# Patient Record
Sex: Male | Born: 1945
Health system: Southern US, Community
[De-identification: ages and names within clinical notes are randomized; demographics above are authoritative.]

## PROBLEM LIST (undated history)

## (undated) DIAGNOSIS — E8809 Other disorders of plasma-protein metabolism, not elsewhere classified: Secondary | ICD-10-CM

## (undated) DIAGNOSIS — R0602 Shortness of breath: Secondary | ICD-10-CM

## (undated) DIAGNOSIS — E119 Type 2 diabetes mellitus without complications: Secondary | ICD-10-CM

## (undated) DIAGNOSIS — I6529 Occlusion and stenosis of unspecified carotid artery: Secondary | ICD-10-CM

## (undated) DIAGNOSIS — Z8669 Personal history of other diseases of the nervous system and sense organs: Secondary | ICD-10-CM

## (undated) DIAGNOSIS — K759 Inflammatory liver disease, unspecified: Secondary | ICD-10-CM

## (undated) DIAGNOSIS — I2510 Atherosclerotic heart disease of native coronary artery without angina pectoris: Secondary | ICD-10-CM

## (undated) DIAGNOSIS — I209 Angina pectoris, unspecified: Secondary | ICD-10-CM

## (undated) DIAGNOSIS — M1990 Unspecified osteoarthritis, unspecified site: Secondary | ICD-10-CM

## (undated) DIAGNOSIS — R0600 Dyspnea, unspecified: Secondary | ICD-10-CM

## (undated) DIAGNOSIS — R079 Chest pain, unspecified: Secondary | ICD-10-CM

## (undated) DIAGNOSIS — K219 Gastro-esophageal reflux disease without esophagitis: Secondary | ICD-10-CM

## (undated) DIAGNOSIS — F329 Major depressive disorder, single episode, unspecified: Secondary | ICD-10-CM

## (undated) DIAGNOSIS — M7062 Trochanteric bursitis, left hip: Secondary | ICD-10-CM

## (undated) DIAGNOSIS — J189 Pneumonia, unspecified organism: Secondary | ICD-10-CM

## (undated) DIAGNOSIS — I10 Essential (primary) hypertension: Secondary | ICD-10-CM

## (undated) DIAGNOSIS — J449 Chronic obstructive pulmonary disease, unspecified: Secondary | ICD-10-CM

## (undated) DIAGNOSIS — Z8719 Personal history of other diseases of the digestive system: Secondary | ICD-10-CM

## (undated) DIAGNOSIS — F4310 Post-traumatic stress disorder, unspecified: Secondary | ICD-10-CM

## (undated) DIAGNOSIS — M7072 Other bursitis of hip, left hip: Secondary | ICD-10-CM

## (undated) DIAGNOSIS — E756 Lipid storage disorder, unspecified: Secondary | ICD-10-CM

## (undated) HISTORY — PX: ANTERIOR FUSION LUMBAR SPINE: SUR629

## (undated) HISTORY — PX: OTHER SURGICAL HISTORY: SHX169

## (undated) HISTORY — PX: SHOULDER ARTHROSCOPY: SHX128

## (undated) HISTORY — DX: Trochanteric bursitis, left hip: M70.62

## (undated) HISTORY — DX: Chronic obstructive pulmonary disease, unspecified: J44.9

## (undated) HISTORY — PX: APPENDECTOMY: SHX54

## (undated) HISTORY — DX: Unspecified osteoarthritis, unspecified site: M19.90

## (undated) HISTORY — DX: Chest pain, unspecified: R07.9

## (undated) HISTORY — DX: Shortness of breath: R06.02

## (undated) HISTORY — DX: Essential (primary) hypertension: I10

---
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
## 1989-12-30 HISTORY — PX: ELBOW ARTHROSCOPY: SUR87

## 2011-04-30 ENCOUNTER — Emergency Department (HOSPITAL_COMMUNITY)
Admission: EM | Admit: 2011-04-30 | Discharge: 2011-04-30 | Disposition: A | Discharge status: Home / Self Care | Payer: Medicare Other | Attending: Emergency Medicine | Admitting: Emergency Medicine

## 2011-04-30 DIAGNOSIS — I10 Essential (primary) hypertension: Secondary | ICD-10-CM | POA: Insufficient documentation

## 2011-04-30 DIAGNOSIS — X58XXXA Exposure to other specified factors, initial encounter: Secondary | ICD-10-CM | POA: Insufficient documentation

## 2011-04-30 DIAGNOSIS — E119 Type 2 diabetes mellitus without complications: Secondary | ICD-10-CM | POA: Insufficient documentation

## 2011-04-30 DIAGNOSIS — T783XXA Angioneurotic edema, initial encounter: Secondary | ICD-10-CM | POA: Insufficient documentation

## 2011-05-21 ENCOUNTER — Ambulatory Visit (HOSPITAL_COMMUNITY)
Admission: RE | Admit: 2011-05-21 | Discharge: 2011-05-21 | Disposition: A | Discharge status: Home / Self Care | Payer: Medicare Other | Source: Ambulatory Visit | Attending: Cardiology | Admitting: Cardiology

## 2011-05-21 DIAGNOSIS — E785 Hyperlipidemia, unspecified: Secondary | ICD-10-CM | POA: Insufficient documentation

## 2011-05-21 DIAGNOSIS — E119 Type 2 diabetes mellitus without complications: Secondary | ICD-10-CM | POA: Insufficient documentation

## 2011-05-21 DIAGNOSIS — I209 Angina pectoris, unspecified: Secondary | ICD-10-CM | POA: Insufficient documentation

## 2011-05-21 DIAGNOSIS — I2510 Atherosclerotic heart disease of native coronary artery without angina pectoris: Secondary | ICD-10-CM | POA: Insufficient documentation

## 2011-05-21 DIAGNOSIS — I10 Essential (primary) hypertension: Secondary | ICD-10-CM | POA: Insufficient documentation

## 2011-05-21 LAB — GLUCOSE, CAPILLARY: Glucose-Capillary: 145 mg/dL — ABNORMAL HIGH (ref 70–99)

## 2011-05-28 ENCOUNTER — Ambulatory Visit (HOSPITAL_COMMUNITY)
Admission: RE | Admit: 2011-05-28 | Discharge: 2011-05-29 | Disposition: A | Discharge status: Home / Self Care | Payer: Medicare Other | Source: Ambulatory Visit | Attending: Cardiology | Admitting: Cardiology

## 2011-05-28 DIAGNOSIS — I2510 Atherosclerotic heart disease of native coronary artery without angina pectoris: Principal | ICD-10-CM | POA: Insufficient documentation

## 2011-05-28 DIAGNOSIS — R079 Chest pain, unspecified: Secondary | ICD-10-CM | POA: Insufficient documentation

## 2011-05-28 DIAGNOSIS — E785 Hyperlipidemia, unspecified: Secondary | ICD-10-CM | POA: Insufficient documentation

## 2011-05-28 DIAGNOSIS — E119 Type 2 diabetes mellitus without complications: Secondary | ICD-10-CM | POA: Insufficient documentation

## 2011-05-28 DIAGNOSIS — I10 Essential (primary) hypertension: Secondary | ICD-10-CM | POA: Insufficient documentation

## 2011-05-28 LAB — GLUCOSE, CAPILLARY
Glucose-Capillary: 126 mg/dL — ABNORMAL HIGH (ref 70–99)
Glucose-Capillary: 145 mg/dL — ABNORMAL HIGH (ref 70–99)

## 2011-05-29 LAB — CBC
HCT: 41.3 % (ref 39.0–52.0)
MCV: 94.7 fL (ref 78.0–100.0)
RBC: 4.36 MIL/uL (ref 4.22–5.81)
RDW: 12.3 % (ref 11.5–15.5)
WBC: 9.1 10*3/uL (ref 4.0–10.5)

## 2011-05-29 LAB — GLUCOSE, CAPILLARY: Glucose-Capillary: 135 mg/dL — ABNORMAL HIGH (ref 70–99)

## 2011-05-29 LAB — BASIC METABOLIC PANEL
BUN: 18 mg/dL (ref 6–23)
Chloride: 107 mEq/L (ref 96–112)
GFR calc non Af Amer: >60 mL/min (ref >60)
Glucose, Bld: 128 mg/dL — ABNORMAL HIGH (ref 70–99)
Potassium: 4 mEq/L (ref 3.5–5.1)
Sodium: 140 mEq/L (ref 135–145)

## 2011-06-10 ENCOUNTER — Ambulatory Visit (HOSPITAL_COMMUNITY)
Admission: RE | Admit: 2011-06-10 | Discharge: 2011-06-11 | Disposition: A | Discharge status: Home / Self Care | Payer: Medicare Other | Source: Ambulatory Visit | Attending: Cardiology | Admitting: Cardiology

## 2011-06-10 DIAGNOSIS — E119 Type 2 diabetes mellitus without complications: Secondary | ICD-10-CM | POA: Insufficient documentation

## 2011-06-10 DIAGNOSIS — Z8249 Family history of ischemic heart disease and other diseases of the circulatory system: Secondary | ICD-10-CM | POA: Insufficient documentation

## 2011-06-10 DIAGNOSIS — Z9861 Coronary angioplasty status: Secondary | ICD-10-CM | POA: Insufficient documentation

## 2011-06-10 DIAGNOSIS — E785 Hyperlipidemia, unspecified: Secondary | ICD-10-CM | POA: Insufficient documentation

## 2011-06-10 DIAGNOSIS — I2510 Atherosclerotic heart disease of native coronary artery without angina pectoris: Secondary | ICD-10-CM | POA: Insufficient documentation

## 2011-06-10 DIAGNOSIS — I10 Essential (primary) hypertension: Secondary | ICD-10-CM | POA: Insufficient documentation

## 2011-06-10 LAB — GLUCOSE, CAPILLARY
Glucose-Capillary: 112 mg/dL — ABNORMAL HIGH (ref 70–99)
Glucose-Capillary: 131 mg/dL — ABNORMAL HIGH (ref 70–99)
Glucose-Capillary: 89 mg/dL (ref 70–99)

## 2011-06-11 LAB — BASIC METABOLIC PANEL
CO2: 25 mEq/L (ref 19–32)
Calcium: 8.8 mg/dL (ref 8.4–10.5)
Creatinine, Ser: 1.01 mg/dL (ref 0.4–1.5)
GFR calc non Af Amer: >60 mL/min (ref >60)
Glucose, Bld: 112 mg/dL — ABNORMAL HIGH (ref 70–99)

## 2011-06-11 LAB — CBC
Hemoglobin: 13.6 g/dL (ref 13.0–17.0)
MCH: 33.4 pg (ref 26.0–34.0)
MCHC: 35.3 g/dL (ref 30.0–36.0)
MCV: 94.6 fL (ref 78.0–100.0)
RBC: 4.07 MIL/uL — ABNORMAL LOW (ref 4.22–5.81)

## 2011-06-25 NOTE — Discharge Summary (Signed)
[name], [name] NO.:  [id number]  MEDICAL RECORD NO.:  [id number]           PATIENT TYPE:  O  LOCATION:  6531                         FACILITY:  [facility]  PHYSICIAN:  Pamella Pert, MD DATE OF BIRTH:  1946/11/12  DATE OF ADMISSION:  05/28/2011 DATE OF DISCHARGE:  05/29/2011                              DISCHARGE SUMMARY   DISCHARGE DIAGNOSES: 1. Chest pain suggestive of angina pectoris. 2. Hyperlipidemia. 3. Atherosclerotic coronary artery disease status post successful PTCA     and complex intervention including stenting of mid and proximal     left anterior descending with overlapping 3.0 x 38 mm drug-eluting     Xience prime stent in the mid segment and a 3.0 x 22 mm Resolute     stent in the proximal left anterior descending.  PTCA and stenting of the ostial circumflex coronary artery with a 3.0 x 22 mm Resolute drug-eluting stent implantation.  The distal LAD stenosis which is diffusely diseased is left alone for medical therapy and potential for possible future angioplasty if needed.  RECOMMENDATIONS:  The patient will need staged PTCA to the right coronary artery which has high-grade lesion in the proximal to mid segment and also distal segment.  Because of her contrast preservation, diabetic state, and prolonged procedure, it will be done in a staged fashion.  His discharge medications will include aspirin 81 mg p.o. daily, Crestor 20 mg p.o. daily, metoprolol 50 mg p.o. b.i.d., and he also will be started on an ACE inhibitor and lisinopril 10 mg once a day.  I will see him back in the office in a week or 10 days and make further recommendations.  BRIEF HISTORY:  [name] is a 65 year old gentleman with history of diabetes, hypertension, hyperlipidemia who has been having exertional chest pain which is suggestive of angina pectoris.  He underwent diagnostic cardiac catheterization on May 21, 2011 and this had revealed severe  triple vessel coronary artery disease, but, however, because of diffuse nature in his LAD stenoses, it was felt that good option would be a percutaneous coronary intervention for coronary bypass grafting.  His ejection fraction was normal.  He was electively brought to the cardiac catheterization lab yesterday and underwent complex coronary intervention to his mid and proximal LAD followed by stent implantation to his ostial circumflex coronary artery.  He did well and had no complications during the procedure and periprocedurally.  On the day of discharge, his vital signs included temperature of 98.2, pulse of 56 beats per minute, respiration 14, blood pressure 120/64 mmHg.  His CBC revealed a hemoglobin of 14.6, hematocrit of 41.3 with stable platelet and white count.  His BUN was 18, creatinine of 1.2, and with a blood sugar of 128.  He was felt stable for discharge.  His cardiac exam, S1, S2 was normal without any gallop or murmur.  Chest was clear.  Abdomen was soft.  Right groin site had healed well without any hematoma.    Pamella Pert, MD     JRG/MEDQ  D:  05/29/2011  T:  05/29/2011  Job:  [id number]  cc:   Aida Puffer, MD  Electronically Signed by Yates Decamp MD on 06/25/2011 09:27:11 AM

## 2011-06-25 NOTE — Cardiovascular Report (Signed)
[name], [name]:  [id number]  MEDICAL RECORD NO.:  [id number]  LOCATION:  6524                         FACILITY:  [facility]  PHYSICIAN:  Pamella Pert, MD DATE OF BIRTH:  [birth year]  DATE OF PROCEDURE:  06/10/2011 DATE OF DISCHARGE:                           CARDIAC CATHETERIZATION   PROCEDURES PERFORMED: 1. Percutaneous transluminal coronary angioplasty and stenting of the     mid right coronary artery. 2. Percutaneous transluminal coronary angioplasty and scoring balloon     angioplasty of the posterior descending branch of the right     coronary dominant, right coronary artery.  INDICATION:  Mr. Oblinger is a pleasant 65 year old gentleman with history of hypertension, hyperlipidemia, diabetes who has had angioplasty to his LAD and circumflex coronary artery that was done on May 28, 2011.  He has residual high-grade stenosis in the mid RCA and also 2 posterior descending branches had a bifurcating 90% stenosis.  He is now brought for elective stage intervention to the right coronary artery.  ANGIOGRAPHIC DATA:  Right coronary artery is an anterior origin.  The mid segment of the right coronary artery has a focal 80% stenosis.  The PD branch and PL branch are very large.  The PD branch bifurcates. There is a bifurcation stenosis involving the secondary PD branch which wants to about 90% stenosis.  INTERVENTION DATA: 1. Successful PTCA and scoring balloon angioplasty of the primary PD     branch of the distal right coronary artery, dominant distal right     coronary artery with reduction of stenosis from 90% to less than     20% with brisk TIMI 3 to TIMI 3 flow maintained at the end of the     procedure.  The secondary PD branch which has a high-grade 80-90%     stenosis is left alone because of acute angle tortuosity and hiatus     lesion. 2. Successful PTCA and stenting of the mid RCA with implantation of a     4.0 x 18 mm Integrity stent.   The stent was deployed at 10     atmospheric pressure for 50 seconds with reduction of stenosis from     80% to 0% with brisk TIMI 3 to TIMI 3 flow maintained at the end of     the procedure.  A total of 150 mL of contrast was utilized for interventional procedure.  TECHNICAL PROCEDURE:  Under sterile precautions using a 6-French right femoral arterial access, a 6-French JR-4 with sidehole guide catheter was advanced into the ascending aorta and then right coronary artery was selectively engaged.  Using Cougar wire and Angiomax for anticoagulation, I was able to cross the proximal and distal lesion with moderate amount of difficulty with the help of a backup AngioSculpt balloon.  Then I was able to advance the AngioSculpt balloon to the PDA of the right coronary artery and multiple balloon inflations were performed at 8 atmospheric pressure for about a minute each.  Having performed this, I also utilized the same balloon to perform angioplasty of the mid RCA at 10 atmospheric pressure for 30 seconds followed by angiography.  Multiple intracoronary nitroglycerin administration was performed.  Following  this, I stented the mid RCA with a 4.0 x 18 mm Integrity stent at 10 atmospheric pressure for 50 seconds, followed by angiography.  Excellent results were noted.  The guidewire was withdrawn and angiography repeated.  Guide catheter disengaged and pulled out of the body.  Right femoral arteriography was performed through arterial access sheath and the access was closed with Perclose.  Because of persistent ooze, a FemoStop was applied.  No other immediate complications were evident.  The patient tolerated the procedure.     Pamella Pert, MD     JRG/MEDQ  D:  06/10/2011  T:  06/11/2011  Job:  [id number]  cc:   Aida Puffer, MD  Electronically Signed by Yates Decamp MD on 06/25/2011 09:27:20 AM

## 2011-06-25 NOTE — Cardiovascular Report (Signed)
NAME:  Miguel Brock, BANIK NO.:  [id number]  MEDICAL RECORD NO.:  [id number]           PATIENT TYPE:  O  LOCATION:  6531                         FACILITY:  [facility]  PHYSICIAN:  Pamella Pert, MD DATE OF BIRTH:  [birth year]  DATE OF PROCEDURE:  05/28/2011 DATE OF DISCHARGE:                           CARDIAC CATHETERIZATION   PROCEDURES PERFORMED: 1. PTCA and stenting of the mid and proximal LAD with implantation of     overlapping drug-eluting 3.0 x 38-mm and a 3.0 x 22-mm XIENCE PRIME     and Resolute stents. 2. PTCA and stenting of the proximal ostial circumflex coronary artery     with 3.0 x 22-mm Resolute drug-eluting stent.  INDICATIONS:  [name] is a 65 year old gentleman with known coronary disease.  He had undergone cardiac catheterization on May 21, 2011, and this had revealed a ostial 80% circumflex stenosis, mid LAD diffuse disease about 21, initially felt to be 60-70%, and a mid to distal LAD of greater than 90% stenosis in tandem fashion.  He also has a mid RCA 80% and a distal RCA 80-90% stenosis in the PL branch. Because of diffuse disease in the LAD, it was felt that the lesion in the circumflex and RCA were amenable for angioplasty percutaneously and LAD focal cutting balloon or scoring balloon angioplasty was contemplated initially, and he was brought for elective angioplasty of the same.  INTERVENTIONAL DATA: 1. Successful PTCA and stenting will be made and proximal LAD with     implantation of a 3.0 x 38-mm overlapped proximally with a 3.0 x 22-     mm drug-eluting stents.  XIENCE PRIME distally and a Resolute DES     proximally necrotized.  The stents were deployed at 8 atmospheric     pressure; however, they were postdilated at 6 atmospheric pressure     with noncompliant 3.0 x 23-mm Resolute stent balloon. 2. Successful PTCA and stenting with the proximal ostial circumflex     with implantation of a 3.0 x 22-mm Resolute  drug-eluting stent.     The LAD stenosis was reduced from 80% to 0%.   Overall results.  Excellent angiographic results with excellent stent deployed into the ostial circumflex and also ostial LAD and the LAD stent extended all the way from the proximal ostial region to the mid segment of the LAD.  The distal LAD is left alone. Circumflex coronary artery stenosis was reduced from 80% to 0% with brisk TIMI 3 to TIMI 3 flow maintained.  Excellent results were evident.  RECOMMENDATIONS:  A total of 210 mL of contrast was utilized for intervention procedure.  I will plan to do a staged PCI for the right coronary artery.  He uses scoring balloon angioplasty of PL branch, which was bifurcating stenosis, and also including mid RCA and we stented directly.  At that time, I may consider relooking of LAD for proximal scoring balloon angioplasty of the distal LAD, but potentially I may treat this with medications only.  TECHNIQUE OF THE PROCEDURE:  Under sterile precautions, using a 6-French right femoral artery access, a 6-French XT 3.5 guide  catheter was utilized engaged left main coronary artery.  Using a Cougar guidewire of the LAD was selectively wired.  Then a 2.0 x 10-mm AngioScore balloon was advanced in to the LAD.  I was unable to pass through the mid segment of the LAD, which appeared to be hazy and then we realized  that the stenosis was much tighter and felt that it was at least about 80%. Hence we scored this with a scoring balloon followed by withdrawal of his balloon because of inability to advance it into the distal segment of the stenosis in the mid LAD.  At this point, we decided to take a 3.0 x 30-mm apex balloon and balloon angioplasty as well as decompression into the mid LAD was performed.  This was followed by implantation of Xience Prime 3.0 x 38-mm stent at 8 atmospheric pressure and postdilatation with [place] Trek 3.0 x 20-mm noncompliant balloon at 12 atmospheric  pressure x2 for 30 seconds each.  Having performed this, we felt we should probably leave the distal LAD stenosis alone and we decided to go with the circumflex stenting.  Initially, I dilated this with a 3.0 x 20-mm [place] Trek, and I was able to place the balloon right at the ostium of the circumflex.  I felt confident that I can easily cross ostium of the circumflex coronary artery.  This was followed by taking a 3.0 x 22-mm Resolute drug-eluting stent into the circumflex coronary artery and placing it at the ostium of the circumflex.  Then we inflated the balloon, the stent did slip back into the left main LAD region.  At this point, we decided we should go after the LAD and circumflex with kissing balloon angioplasty.  Hence, another Cougar wire advanced back into the LAD.  A new [place] Trek 3.0 x 20 mm balloon was advanced into the circumflex coronary artery and 3.0 x 20-mm apex in to the LAD.  Multiple inflations from 8-12 atmospheric pressure was performed throughout the LAD and circumflex coronary artery stent. Having performed this, angiography with edge dissection at the distal end of the stent. We decided to stent the whole entire proximal LAD with a 3.0 x 22-mm Resolute stent, which was deployed at 12 atmospheric pressure for 30 seconds followed by postdilatation to same stent balloon keeping it between the old and the new stent at 60 atmospheric pressure for 20 seconds.  Having performed this, angiography revealed excellent results. The circumflex stenosis was also well covered.  At this point, all the wires were withdrawn, angiography was repeated, guide catheter disengaged and pulled out of the body.  Initially, I attempted to proceed with the scoring balloon angioplasty of the distal LAD only.  However, the mid segment of his LAD appeared to be more significant than was evident angiographically  and I was unable to cross  stenosis with the balloon.  Hence, we decided that we  should proceed with angioplasty and stenting of the mid LAD followed by reconstitution of the distal LAD angioplasty.  Stenting of the mid LAD, which we had excellent results and we decided to move the distal LAD stenosis alone at it was severely diffusely diseased, also small vessel.  Hence we decided to proceed with circumflex coronary artery stenosis stenting.  However, when the stent was placed into the ostial circumflex.  This stent did slip back into the left main LAD.  Hence kissing balloon angioplasty followed by dissection in the proximal LAD leading to stenting was performed. Right femoral arteriography  was performed through the arterial access sheath; however, I was unable to obtain adequate hemostasis.  Hence FemoStop was applied, and the patient was transferred to the recovery area in stable condition.  Overall, the patient tolerated the procedure well.  No immediate complications were noted.     Pamella Pert, MD     JRG/MEDQ  D:  05/28/2011  T:  05/29/2011  Job:  [id number]  cc:   Aida Puffer, MD  Electronically Signed by Yates Decamp MD on 06/25/2011 09:26:52 AM

## 2011-06-25 NOTE — Discharge Summary (Signed)
  [name], [name].:  [id number]  MEDICAL RECORD NO.:  [id number]  LOCATION:  6524                         FACILITY:  [facility]  PHYSICIAN:  Pamella Pert, MD DATE OF BIRTH:  January 05, 1946  DATE OF ADMISSION:  06/10/2011 DATE OF DISCHARGE:  06/11/2011                              DISCHARGE SUMMARY   DISCHARGE DIAGNOSES: 1. Atherosclerotic coronary artery disease status post PTCA and     stenting of the mid right carotid artery with implantation of a 4.0     x 18 mm nondrug-eluting Integrity stent and scoring balloon     angioplasty of the posterior descending artery branch of the right     coronary artery. 2. History of PTCA and stenting to proximal and mid left anterior     descending with a 3.0 x 22 mm overlapped with a 3.0 x 38 mm     Resolute stent, and a proximal circumflex coronary artery 3.0 x 22     mm Resolute stent performed on May 28, 2011. 3. Hypertension. 4. Diabetes mellitus. 5. Family history of premature coronary artery disease.  RECOMMENDATIONS:  The patient was discharged home today.  His discharge medications will be the same home medications as before, that includes; 1. Aspirin 81 mg p.o. daily. 2. Lipitor 20 mg p.o. daily. 3. Lisinopril 5 mg p.o. daily. 4. Metformin 5 mg p.o. b.i.d. 5. Metoprolol tartrate 50 mg half tablet p.o. b.i.d. 6. Nitroglycerin 0.4 mg sublingual p.r.n. 7. Prasugrel 10 mg p.o. daily. 8. Vitamin D2 50,000 international units q.1 week.  The patient will follow up with me in about 2 weeks and along with Dr. Aida Puffer in 2 weeks.  BRIEF HISTORY AND HOSPITAL COURSE:  Miguel Brock is a pleasant 65-year gentleman who had been having exertional chest pain, which was suggestive of angina pectoris.  He had undergone cardiac catheterization and angioplasty to his proximal and mid LAD and circumflex coronary artery on May 28, 2011, and he was now brought back for elective staged intervention to the right  coronary artery.  He underwent successful angioplasty of the same.  He was felt stable for discharge the following day.  He is chest pain-free, and his EKG demonstrates normal sinus rhythm on the day of discharge.  I will follow him up on the outpatient basis.     Pamella Pert, MD     JRG/MEDQ  D:  06/11/2011  T:  06/12/2011  Job:  [id number]  cc:   Aida Puffer, MD  Electronically Signed by Yates Decamp MD on 06/25/2011 78:46:96 AM

## 2011-07-20 NOTE — Cardiovascular Report (Signed)
[name], [name] NO.:  [id number]  MEDICAL RECORD NO.:  [id number]           PATIENT TYPE:  O  LOCATION:  6522                         FACILITY:  [facility]  PHYSICIAN:  Pamella Pert, MD DATE OF BIRTH:  1946/08/16  DATE OF PROCEDURE:  05/21/2011 DATE OF DISCHARGE:  05/21/2011                           CARDIAC CATHETERIZATION   PROCEDURE PERFORMED: 1. Left ventriculography. 2. Selective right and left coronary arteriography.  INDICATIONS:  [name] is a diabetic 65 year old gentleman who has history of hypertension and hyperlipidemia who had been doing well until recently, had chest discomfort, which is exertional.  This started about 2 months ago.  Given his significant chest pain and multiple cardiovascular risk factors and his chest pain very typical for angina pectoris, he was directly brought to the cardiac cath lab to evaluate his coronary anatomy.  HEMODYNAMIC DATA:  The left ventricular pressure was 119/10 with end- diastolic pressure of 15 mmHg.  Aortic pressure was 113/63 with a mean of 86 mmHg.  There was no pressure gradient across the aortic valve.  ANGIOGRAPHIC DATA:  Left ventricle.  Left ventricular systolic function was normal with an ejection fraction of 55% to 60% with low regional wall motion abnormalities.  Right coronary artery.  Right coronary artery is a very large-caliber vessel and a dominant vessel giving origin to large PDA, which bifurcates and a large PL branch.  The RCA in the proximal segment has an 80% focal stenosis followed by trifurcating stenoses in the distal right coronary artery involving the PD branch.  This is 80% to 90% stenosed.  Left main coronary artery.  Left main coronary artery is a large-caliber vessel.  Smooth and normal.  LAD.  LAD is a large-caliber vessel in the proximal segment giving origin to high diagonal one, which is smooth with mild luminal irregularity in the midsegment.  The  LAD itself after the origin of diagonal is severely diffusely diseased.  Mid to distal LAD has a 95% tandem stenoses involving the mid-to-distal and towards the apical LAD.  Circumflex coronary artery.  Circumflex coronary artery has ostial long 70% to 80% stenoses.  This a large-caliber vessel.  IMPRESSION:  Severe triple-vessel coronary artery disease.  LAD is not bypassable with severe diffuse disease.  However, the mid-to-distal LAD stenosis appears to be amenable for scoring balloon angioplasty. Circumflex and right coronary stenosis appear to be amenable for balloon angioplasty and stenting.  RECOMMENDATIONS: 1. She will be scheduled for an elective PCI to the,     a.     RCA proximal to mid, direct stenting followed by scoring      balloon angioplasty of the PD branch followed by stenting.     b.     Scoring balloon angioplasty and PTCA of the distal LAD. 2. The patient will then probably be scheduled for a staged procedure     for the circumflex coronary artery unless the RCA procedure and LAD     procedure goes well.  Then we can consider stenting of the     circumflex at the same time.  The patient does not want  to have     bypass surgery and this was discussed even prior to the procedure.     I will re-discuss these issues with the patient and his wife and     make final recommendations. 3. I will stop his Zocor and switch him over to Crestor 20 mg once a     day and start Effient at 10 mg once a day, Imdur 60 mg p.o. daily     along with giving him a prescription for sublingual nitroglycerin     use. 4. A total of 90 mL of contrast was utilized for diagnostic     angiography.  TECHNIQUE OF THE PROCEDURE:  Under sterile precautions, using a 5-French right radial access, a 5-French Tig #4 catheter was advanced into the ascending aorta.  Selective right and left coronary arteriography was performed.  Catheter was then exchanged with the help of a long exchange length  J-wire for a 5-French pigtail catheter and left ventriculography was performed both in LAO and RAO projection.  Then the catheter was then pulled into the asymmetric aorta then out of the body over a J- wire.  Intracoronary nitroglycerin was administered during angiography. Overall, the patient tolerated the procedure well.  No immediate complications.     Pamella Pert, MD     JRG/MEDQ  D:  05/21/2011  T:  05/22/2011  Job:  [id number]  Electronically Signed by Yates Decamp MD on 07/20/2011 01:40:14 PM

## 2012-02-13 DIAGNOSIS — I209 Angina pectoris, unspecified: Secondary | ICD-10-CM | POA: Diagnosis not present

## 2012-02-13 DIAGNOSIS — I10 Essential (primary) hypertension: Secondary | ICD-10-CM | POA: Diagnosis not present

## 2012-02-13 DIAGNOSIS — G8921 Chronic pain due to trauma: Secondary | ICD-10-CM | POA: Diagnosis not present

## 2012-06-17 DIAGNOSIS — E7800 Pure hypercholesterolemia, unspecified: Secondary | ICD-10-CM | POA: Diagnosis not present

## 2012-06-17 DIAGNOSIS — Z9861 Coronary angioplasty status: Secondary | ICD-10-CM | POA: Diagnosis not present

## 2012-06-17 DIAGNOSIS — I2510 Atherosclerotic heart disease of native coronary artery without angina pectoris: Secondary | ICD-10-CM | POA: Diagnosis not present

## 2012-06-17 DIAGNOSIS — I10 Essential (primary) hypertension: Secondary | ICD-10-CM | POA: Diagnosis not present

## 2012-06-24 DIAGNOSIS — E1169 Type 2 diabetes mellitus with other specified complication: Secondary | ICD-10-CM | POA: Diagnosis not present

## 2012-06-24 DIAGNOSIS — I2510 Atherosclerotic heart disease of native coronary artery without angina pectoris: Secondary | ICD-10-CM | POA: Diagnosis not present

## 2012-06-24 DIAGNOSIS — E7800 Pure hypercholesterolemia, unspecified: Secondary | ICD-10-CM | POA: Diagnosis not present

## 2012-07-24 DIAGNOSIS — I2510 Atherosclerotic heart disease of native coronary artery without angina pectoris: Secondary | ICD-10-CM | POA: Diagnosis not present

## 2012-07-24 DIAGNOSIS — E1169 Type 2 diabetes mellitus with other specified complication: Secondary | ICD-10-CM | POA: Diagnosis not present

## 2012-07-24 DIAGNOSIS — I10 Essential (primary) hypertension: Secondary | ICD-10-CM | POA: Diagnosis not present

## 2012-07-24 DIAGNOSIS — I209 Angina pectoris, unspecified: Secondary | ICD-10-CM | POA: Diagnosis not present

## 2012-07-29 DIAGNOSIS — IMO0002 Reserved for concepts with insufficient information to code with codable children: Secondary | ICD-10-CM | POA: Diagnosis not present

## 2012-07-29 DIAGNOSIS — D2370 Other benign neoplasm of skin of unspecified lower limb, including hip: Secondary | ICD-10-CM | POA: Diagnosis not present

## 2012-07-29 DIAGNOSIS — L089 Local infection of the skin and subcutaneous tissue, unspecified: Secondary | ICD-10-CM | POA: Diagnosis not present

## 2012-07-29 DIAGNOSIS — D485 Neoplasm of uncertain behavior of skin: Secondary | ICD-10-CM | POA: Diagnosis not present

## 2012-07-29 DIAGNOSIS — E785 Hyperlipidemia, unspecified: Secondary | ICD-10-CM | POA: Diagnosis not present

## 2012-08-03 DIAGNOSIS — Z9861 Coronary angioplasty status: Secondary | ICD-10-CM | POA: Diagnosis not present

## 2012-08-03 DIAGNOSIS — I10 Essential (primary) hypertension: Secondary | ICD-10-CM | POA: Diagnosis not present

## 2012-08-03 DIAGNOSIS — I2510 Atherosclerotic heart disease of native coronary artery without angina pectoris: Secondary | ICD-10-CM | POA: Diagnosis not present

## 2012-08-03 DIAGNOSIS — E7800 Pure hypercholesterolemia, unspecified: Secondary | ICD-10-CM | POA: Diagnosis not present

## 2012-09-21 DIAGNOSIS — R5381 Other malaise: Secondary | ICD-10-CM | POA: Diagnosis not present

## 2012-09-21 DIAGNOSIS — R5383 Other fatigue: Secondary | ICD-10-CM | POA: Diagnosis not present

## 2012-09-21 DIAGNOSIS — J019 Acute sinusitis, unspecified: Secondary | ICD-10-CM | POA: Diagnosis not present

## 2012-10-08 DIAGNOSIS — J13 Pneumonia due to Streptococcus pneumoniae: Secondary | ICD-10-CM | POA: Diagnosis not present

## 2012-10-08 DIAGNOSIS — J0100 Acute maxillary sinusitis, unspecified: Secondary | ICD-10-CM | POA: Diagnosis not present

## 2012-10-13 ENCOUNTER — Other Ambulatory Visit: Payer: Self-pay | Admitting: Family Medicine

## 2012-10-13 DIAGNOSIS — J329 Chronic sinusitis, unspecified: Secondary | ICD-10-CM

## 2012-10-13 DIAGNOSIS — R0981 Nasal congestion: Secondary | ICD-10-CM

## 2012-10-15 ENCOUNTER — Ambulatory Visit
Admission: RE | Admit: 2012-10-15 | Discharge: 2012-10-15 | Disposition: A | Discharge status: Home / Self Care | Payer: Medicare Other | Source: Ambulatory Visit | Attending: Family Medicine | Admitting: Family Medicine

## 2012-10-15 DIAGNOSIS — R062 Wheezing: Secondary | ICD-10-CM | POA: Diagnosis not present

## 2012-10-15 DIAGNOSIS — J3489 Other specified disorders of nose and nasal sinuses: Secondary | ICD-10-CM | POA: Diagnosis not present

## 2012-10-15 DIAGNOSIS — R05 Cough: Secondary | ICD-10-CM | POA: Diagnosis not present

## 2012-10-15 DIAGNOSIS — R0981 Nasal congestion: Secondary | ICD-10-CM

## 2012-10-15 DIAGNOSIS — J329 Chronic sinusitis, unspecified: Secondary | ICD-10-CM

## 2012-10-15 DIAGNOSIS — R059 Cough, unspecified: Secondary | ICD-10-CM | POA: Diagnosis not present

## 2012-10-15 DIAGNOSIS — R0602 Shortness of breath: Secondary | ICD-10-CM | POA: Diagnosis not present

## 2012-10-28 DIAGNOSIS — A0472 Enterocolitis due to Clostridium difficile, not specified as recurrent: Secondary | ICD-10-CM | POA: Diagnosis not present

## 2012-10-28 DIAGNOSIS — K5289 Other specified noninfective gastroenteritis and colitis: Secondary | ICD-10-CM | POA: Diagnosis not present

## 2012-10-28 DIAGNOSIS — R1084 Generalized abdominal pain: Secondary | ICD-10-CM | POA: Diagnosis not present

## 2012-11-02 DIAGNOSIS — E119 Type 2 diabetes mellitus without complications: Secondary | ICD-10-CM | POA: Diagnosis not present

## 2012-11-02 DIAGNOSIS — I10 Essential (primary) hypertension: Secondary | ICD-10-CM | POA: Diagnosis not present

## 2012-11-02 DIAGNOSIS — K5732 Diverticulitis of large intestine without perforation or abscess without bleeding: Secondary | ICD-10-CM | POA: Diagnosis not present

## 2012-11-10 DIAGNOSIS — K5732 Diverticulitis of large intestine without perforation or abscess without bleeding: Secondary | ICD-10-CM | POA: Diagnosis not present

## 2012-11-10 DIAGNOSIS — I259 Chronic ischemic heart disease, unspecified: Secondary | ICD-10-CM | POA: Diagnosis not present

## 2012-12-02 DIAGNOSIS — K5289 Other specified noninfective gastroenteritis and colitis: Secondary | ICD-10-CM | POA: Diagnosis not present

## 2012-12-02 DIAGNOSIS — Z86010 Personal history of colonic polyps: Secondary | ICD-10-CM | POA: Diagnosis not present

## 2012-12-28 DIAGNOSIS — M545 Low back pain, unspecified: Secondary | ICD-10-CM | POA: Diagnosis not present

## 2012-12-28 DIAGNOSIS — S339XXA Sprain of unspecified parts of lumbar spine and pelvis, initial encounter: Secondary | ICD-10-CM | POA: Diagnosis not present

## 2012-12-28 DIAGNOSIS — I259 Chronic ischemic heart disease, unspecified: Secondary | ICD-10-CM | POA: Diagnosis not present

## 2012-12-28 DIAGNOSIS — M76899 Other specified enthesopathies of unspecified lower limb, excluding foot: Secondary | ICD-10-CM | POA: Diagnosis not present

## 2012-12-28 DIAGNOSIS — I209 Angina pectoris, unspecified: Secondary | ICD-10-CM | POA: Diagnosis not present

## 2012-12-28 DIAGNOSIS — K5732 Diverticulitis of large intestine without perforation or abscess without bleeding: Secondary | ICD-10-CM | POA: Diagnosis not present

## 2013-02-03 DIAGNOSIS — I10 Essential (primary) hypertension: Secondary | ICD-10-CM | POA: Diagnosis not present

## 2013-02-03 DIAGNOSIS — Z9861 Coronary angioplasty status: Secondary | ICD-10-CM | POA: Diagnosis not present

## 2013-02-03 DIAGNOSIS — E785 Hyperlipidemia, unspecified: Secondary | ICD-10-CM | POA: Diagnosis not present

## 2013-02-03 DIAGNOSIS — I2510 Atherosclerotic heart disease of native coronary artery without angina pectoris: Secondary | ICD-10-CM | POA: Diagnosis not present

## 2013-02-03 DIAGNOSIS — J019 Acute sinusitis, unspecified: Secondary | ICD-10-CM | POA: Diagnosis not present

## 2013-02-03 DIAGNOSIS — E119 Type 2 diabetes mellitus without complications: Secondary | ICD-10-CM | POA: Diagnosis not present

## 2013-02-03 DIAGNOSIS — IMO0002 Reserved for concepts with insufficient information to code with codable children: Secondary | ICD-10-CM | POA: Diagnosis not present

## 2013-03-15 DIAGNOSIS — I2510 Atherosclerotic heart disease of native coronary artery without angina pectoris: Secondary | ICD-10-CM | POA: Diagnosis not present

## 2013-03-15 DIAGNOSIS — E119 Type 2 diabetes mellitus without complications: Secondary | ICD-10-CM | POA: Diagnosis not present

## 2013-03-15 DIAGNOSIS — E7800 Pure hypercholesterolemia, unspecified: Secondary | ICD-10-CM | POA: Diagnosis not present

## 2013-03-15 DIAGNOSIS — I10 Essential (primary) hypertension: Secondary | ICD-10-CM | POA: Diagnosis not present

## 2013-03-18 DIAGNOSIS — E119 Type 2 diabetes mellitus without complications: Secondary | ICD-10-CM | POA: Diagnosis not present

## 2013-04-01 DIAGNOSIS — I2510 Atherosclerotic heart disease of native coronary artery without angina pectoris: Secondary | ICD-10-CM | POA: Diagnosis not present

## 2013-04-01 DIAGNOSIS — K219 Gastro-esophageal reflux disease without esophagitis: Secondary | ICD-10-CM | POA: Diagnosis not present

## 2013-04-01 DIAGNOSIS — Z1331 Encounter for screening for depression: Secondary | ICD-10-CM | POA: Diagnosis not present

## 2013-04-01 DIAGNOSIS — I10 Essential (primary) hypertension: Secondary | ICD-10-CM | POA: Diagnosis not present

## 2013-04-01 DIAGNOSIS — B1710 Acute hepatitis C without hepatic coma: Secondary | ICD-10-CM | POA: Diagnosis not present

## 2013-04-01 DIAGNOSIS — E785 Hyperlipidemia, unspecified: Secondary | ICD-10-CM | POA: Diagnosis not present

## 2013-04-01 DIAGNOSIS — E119 Type 2 diabetes mellitus without complications: Secondary | ICD-10-CM | POA: Diagnosis not present

## 2013-04-19 DIAGNOSIS — M47817 Spondylosis without myelopathy or radiculopathy, lumbosacral region: Secondary | ICD-10-CM | POA: Diagnosis not present

## 2013-04-19 DIAGNOSIS — M25559 Pain in unspecified hip: Secondary | ICD-10-CM | POA: Diagnosis not present

## 2013-04-26 DIAGNOSIS — M545 Low back pain, unspecified: Secondary | ICD-10-CM | POA: Diagnosis not present

## 2013-04-30 DIAGNOSIS — M545 Low back pain, unspecified: Secondary | ICD-10-CM | POA: Diagnosis not present

## 2013-05-05 DIAGNOSIS — M545 Low back pain, unspecified: Secondary | ICD-10-CM | POA: Diagnosis not present

## 2013-05-19 DIAGNOSIS — M545 Low back pain, unspecified: Secondary | ICD-10-CM | POA: Diagnosis not present

## 2013-05-25 DIAGNOSIS — J209 Acute bronchitis, unspecified: Secondary | ICD-10-CM | POA: Diagnosis not present

## 2013-05-25 DIAGNOSIS — R062 Wheezing: Secondary | ICD-10-CM | POA: Diagnosis not present

## 2013-05-25 DIAGNOSIS — I10 Essential (primary) hypertension: Secondary | ICD-10-CM | POA: Diagnosis not present

## 2013-05-25 DIAGNOSIS — E119 Type 2 diabetes mellitus without complications: Secondary | ICD-10-CM | POA: Diagnosis not present

## 2013-06-04 DIAGNOSIS — IMO0001 Reserved for inherently not codable concepts without codable children: Secondary | ICD-10-CM | POA: Diagnosis not present

## 2013-06-04 DIAGNOSIS — E119 Type 2 diabetes mellitus without complications: Secondary | ICD-10-CM | POA: Diagnosis not present

## 2013-06-04 DIAGNOSIS — Z79899 Other long term (current) drug therapy: Secondary | ICD-10-CM | POA: Diagnosis not present

## 2013-06-04 DIAGNOSIS — R5383 Other fatigue: Secondary | ICD-10-CM | POA: Diagnosis not present

## 2013-06-04 DIAGNOSIS — I10 Essential (primary) hypertension: Secondary | ICD-10-CM | POA: Diagnosis not present

## 2013-06-04 DIAGNOSIS — E1169 Type 2 diabetes mellitus with other specified complication: Secondary | ICD-10-CM | POA: Diagnosis not present

## 2013-06-04 DIAGNOSIS — E7800 Pure hypercholesterolemia, unspecified: Secondary | ICD-10-CM | POA: Diagnosis not present

## 2013-06-04 DIAGNOSIS — I2510 Atherosclerotic heart disease of native coronary artery without angina pectoris: Secondary | ICD-10-CM | POA: Diagnosis not present

## 2013-06-04 DIAGNOSIS — Z125 Encounter for screening for malignant neoplasm of prostate: Secondary | ICD-10-CM | POA: Diagnosis not present

## 2013-06-04 DIAGNOSIS — E785 Hyperlipidemia, unspecified: Secondary | ICD-10-CM | POA: Diagnosis not present

## 2013-06-04 DIAGNOSIS — R5381 Other malaise: Secondary | ICD-10-CM | POA: Diagnosis not present

## 2013-06-08 DIAGNOSIS — M48061 Spinal stenosis, lumbar region without neurogenic claudication: Secondary | ICD-10-CM | POA: Diagnosis not present

## 2013-06-08 DIAGNOSIS — I2510 Atherosclerotic heart disease of native coronary artery without angina pectoris: Secondary | ICD-10-CM | POA: Diagnosis not present

## 2013-06-08 DIAGNOSIS — Z125 Encounter for screening for malignant neoplasm of prostate: Secondary | ICD-10-CM | POA: Diagnosis not present

## 2013-06-08 DIAGNOSIS — E785 Hyperlipidemia, unspecified: Secondary | ICD-10-CM | POA: Diagnosis not present

## 2013-06-08 DIAGNOSIS — I10 Essential (primary) hypertension: Secondary | ICD-10-CM | POA: Diagnosis not present

## 2013-06-08 DIAGNOSIS — Z0000 Encounter for general adult medical examination without abnormal findings: Secondary | ICD-10-CM | POA: Diagnosis not present

## 2013-06-08 DIAGNOSIS — E1169 Type 2 diabetes mellitus with other specified complication: Secondary | ICD-10-CM | POA: Diagnosis not present

## 2013-06-08 DIAGNOSIS — Z23 Encounter for immunization: Secondary | ICD-10-CM | POA: Diagnosis not present

## 2013-06-18 DIAGNOSIS — I10 Essential (primary) hypertension: Secondary | ICD-10-CM | POA: Diagnosis not present

## 2013-06-18 DIAGNOSIS — E7800 Pure hypercholesterolemia, unspecified: Secondary | ICD-10-CM | POA: Diagnosis not present

## 2013-06-18 DIAGNOSIS — I2510 Atherosclerotic heart disease of native coronary artery without angina pectoris: Secondary | ICD-10-CM | POA: Diagnosis not present

## 2013-06-18 DIAGNOSIS — E119 Type 2 diabetes mellitus without complications: Secondary | ICD-10-CM | POA: Diagnosis not present

## 2013-06-25 DIAGNOSIS — M545 Low back pain, unspecified: Secondary | ICD-10-CM | POA: Diagnosis not present

## 2013-06-30 ENCOUNTER — Encounter (HOSPITAL_COMMUNITY): Payer: Self-pay | Admitting: Pharmacy Technician

## 2013-07-01 ENCOUNTER — Other Ambulatory Visit: Payer: Self-pay

## 2013-07-01 DIAGNOSIS — M48061 Spinal stenosis, lumbar region without neurogenic claudication: Secondary | ICD-10-CM | POA: Diagnosis not present

## 2013-07-05 ENCOUNTER — Encounter (HOSPITAL_COMMUNITY)
Admission: RE | Admit: 2013-07-05 | Discharge: 2013-07-05 | Disposition: A | Discharge status: Home / Self Care | Payer: Medicare Other | Source: Ambulatory Visit | Attending: Orthopedic Surgery | Admitting: Orthopedic Surgery

## 2013-07-05 ENCOUNTER — Encounter (HOSPITAL_COMMUNITY): Payer: Self-pay

## 2013-07-05 ENCOUNTER — Ambulatory Visit (HOSPITAL_COMMUNITY)
Admission: RE | Admit: 2013-07-05 | Discharge: 2013-07-05 | Disposition: A | Discharge status: Home / Self Care | Payer: Medicare Other | Source: Ambulatory Visit | Attending: Orthopedic Surgery | Admitting: Orthopedic Surgery

## 2013-07-05 DIAGNOSIS — E119 Type 2 diabetes mellitus without complications: Secondary | ICD-10-CM | POA: Diagnosis not present

## 2013-07-05 DIAGNOSIS — Z9861 Coronary angioplasty status: Secondary | ICD-10-CM | POA: Diagnosis not present

## 2013-07-05 DIAGNOSIS — Z01810 Encounter for preprocedural cardiovascular examination: Secondary | ICD-10-CM | POA: Insufficient documentation

## 2013-07-05 DIAGNOSIS — Z0183 Encounter for blood typing: Secondary | ICD-10-CM | POA: Insufficient documentation

## 2013-07-05 DIAGNOSIS — Z01812 Encounter for preprocedural laboratory examination: Secondary | ICD-10-CM | POA: Insufficient documentation

## 2013-07-05 DIAGNOSIS — K219 Gastro-esophageal reflux disease without esophagitis: Secondary | ICD-10-CM | POA: Diagnosis not present

## 2013-07-05 DIAGNOSIS — I10 Essential (primary) hypertension: Secondary | ICD-10-CM | POA: Diagnosis not present

## 2013-07-05 DIAGNOSIS — M47817 Spondylosis without myelopathy or radiculopathy, lumbosacral region: Secondary | ICD-10-CM | POA: Diagnosis not present

## 2013-07-05 DIAGNOSIS — M5137 Other intervertebral disc degeneration, lumbosacral region: Secondary | ICD-10-CM | POA: Diagnosis not present

## 2013-07-05 DIAGNOSIS — Z09 Encounter for follow-up examination after completed treatment for conditions other than malignant neoplasm: Secondary | ICD-10-CM | POA: Diagnosis not present

## 2013-07-05 DIAGNOSIS — M51379 Other intervertebral disc degeneration, lumbosacral region without mention of lumbar back pain or lower extremity pain: Secondary | ICD-10-CM | POA: Diagnosis not present

## 2013-07-05 DIAGNOSIS — I2510 Atherosclerotic heart disease of native coronary artery without angina pectoris: Secondary | ICD-10-CM | POA: Diagnosis not present

## 2013-07-05 DIAGNOSIS — M519 Unspecified thoracic, thoracolumbar and lumbosacral intervertebral disc disorder: Secondary | ICD-10-CM | POA: Diagnosis not present

## 2013-07-05 DIAGNOSIS — R935 Abnormal findings on diagnostic imaging of other abdominal regions, including retroperitoneum: Secondary | ICD-10-CM | POA: Diagnosis not present

## 2013-07-05 DIAGNOSIS — M549 Dorsalgia, unspecified: Secondary | ICD-10-CM | POA: Diagnosis not present

## 2013-07-05 DIAGNOSIS — Z01818 Encounter for other preprocedural examination: Secondary | ICD-10-CM | POA: Insufficient documentation

## 2013-07-05 HISTORY — DX: Type 2 diabetes mellitus without complications: E11.9

## 2013-07-05 HISTORY — DX: Gastro-esophageal reflux disease without esophagitis: K21.9

## 2013-07-05 HISTORY — DX: Personal history of other diseases of the digestive system: Z87.19

## 2013-07-05 HISTORY — DX: Essential (primary) hypertension: I10

## 2013-07-05 HISTORY — DX: Inflammatory liver disease, unspecified: K75.9

## 2013-07-05 HISTORY — DX: Atherosclerotic heart disease of native coronary artery without angina pectoris: I25.10

## 2013-07-05 HISTORY — DX: Pneumonia, unspecified organism: J18.9

## 2013-07-05 HISTORY — DX: Angina pectoris, unspecified: I20.9

## 2013-07-05 LAB — CBC
Platelets: 239 10*3/uL (ref 150–400)
RBC: 4.32 MIL/uL (ref 4.22–5.81)
WBC: 9.5 10*3/uL (ref 4.0–10.5)

## 2013-07-05 LAB — COMPREHENSIVE METABOLIC PANEL WITH GFR
ALT: 35 U/L (ref 0–53)
AST: 30 U/L (ref 0–37)
Albumin: 3.8 g/dL (ref 3.5–5.2)
Alkaline Phosphatase: 106 U/L (ref 39–117)
BUN: 22 mg/dL (ref 6–23)
CO2: 27 meq/L (ref 19–32)
Calcium: 9.4 mg/dL (ref 8.4–10.5)
Chloride: 99 meq/L (ref 96–112)
Creatinine, Ser: 1.23 mg/dL (ref 0.50–1.35)
GFR calc Af Amer: 68 mL/min — ABNORMAL LOW
GFR calc non Af Amer: 59 mL/min — ABNORMAL LOW
Glucose, Bld: 170 mg/dL — ABNORMAL HIGH (ref 70–99)
Potassium: 3.3 meq/L — ABNORMAL LOW (ref 3.5–5.1)
Sodium: 138 meq/L (ref 135–145)
Total Bilirubin: 0.5 mg/dL (ref 0.3–1.2)
Total Protein: 7.1 g/dL (ref 6.0–8.3)

## 2013-07-05 LAB — TYPE AND SCREEN: ABO/RH(D): O POS

## 2013-07-05 LAB — ABO/RH: ABO/RH(D): O POS

## 2013-07-05 LAB — SURGICAL PCR SCREEN
MRSA, PCR: NEGATIVE
Staphylococcus aureus: POSITIVE — AB

## 2013-07-05 NOTE — Progress Notes (Signed)
reqd notes, any tests from recent visit to dr Jacinto Halim

## 2013-07-05 NOTE — H&P (Signed)
History of Present Illness  The patient is a 67 year old male who presents with back pain. The patient is here today in referral from Dr. Ethelene Hal. The patient reports central back pain symptoms including low back pain which began 2 year(s) ago without any known injury. and Symptoms include pain and numbness (in the toes of his left foot), while symptoms do not include tingling, incontinence of stool or incontinence of urine. The pain radiates to the left posterior thigh. The patient describes the pain as sharp and burning (across his lower back). The patient describes the severity of their symptoms as moderate in severity. The patient feels as if the symptoms are worsening. Current treatment includes epidural injections and activity modification. Past treatment has included epidural injections.    Subjective Transcription  He presents for evaluation of severe back pain. Kashmere is a very active 68 YO gentleman, who has been having progressive, debilitating pain for the last several years. He's had multiple injections, P.T., chiropractic treatment, activity modification, and nothing has helped. His quality of life has diminished and, given the ongoing severe pain, he was referred for surgical intervention.    Allergies No Known Drug Allergies. 04/26/2013   Social History) Alcohol use. Occasional alcohol use. current drinker; drinks beer and hard liquor; 5-7 per week Current work status. retired Financial planner (Currently). no Drug/Alcohol Rehab (Previously). no Exercise. Exercises daily; does other Illicit drug use. no Living situation. live with spouse Marital status. married Number of flights of stairs before winded. greater than 5 Pain Contract. no Tobacco / smoke exposure. no Tobacco use. Former smoker. former smoker   Medication History MetFORMIN HCl ( Oral) Specific dose unknown - Active. Aspirin (81MG  Tablet, 1 (one) Oral) Active.   Objective  Transcription  On clinical exam, he's a pleasant gentleman, who appears younger than his stated age. He's alert. He's oriented times 3. No shortness of breath or chest pain. The abdomen is soft and nontender. No history of incontinence of bowel or bladder. He has significant back, left buttock and left hamstring pain. The back pain is about 75% of his overall disability. The leg pain is intermittent, and it's only about 25%. He has no real hip, knee or ankle pain with joint ROM. He has 5/5 strength, no footdrop. EHL, tibialis anterior, gastrocnemius are intact. Intact peripheral pulses. Negative Babinski. No clonus. Compartments are soft and nontender.    At this point in time, his x-rays and MRI are reviewed. He has significant collapse of the L5-S1 disc on plain imaging. On MRI, he has DDD at L5-S1, moderate degenerative changes at the other levels, hard disc osteophyte posteriorly as well. There is severe left foraminal narrowing with encroachment.    Assessment & Plan l Risks of surgery include, but are not limited to: Death, stroke, paralysis, nerve root damage/injury, bleeding, blood clots, loss of bowel/bladder control, sexual dysfunction, retrograde ejaculation, hardware failure, or malposition, spinal fluid leak, adjacent segment disease, non-union, need for further surgery, ongoing or worse pain, injury to bladder, bowel and abdominal contents, infection and recurrent disc herniation  l We have gone over the risks and benefits of surgery, which include infection, bleeding, nerve damage, death, stroke, paralysis, failure to heal, need for further surgery, ongoing or worse pain, loss of fixation, need for further surgery, CSF leak, loss of bowel or bladder control, ongoing or worse pain.    Plans Transcription  At this point in time, since the majority of his pain is back pain, I would recommend  doing an anterior lumbar interbody fusion. This allows for  the best visualization of the disc, a more thorough discectomy, and implantation of a larger intervertebral device. I think this will allow for indirect decompression of the foramen. If he continues to have increased left radicular leg pain after this, then we can always go back and do a foraminotomy. At this point he has expressed an understanding of the risks which include infection, bleeding, nerve damage, death, stroke, paralysis, failure to heal, need for further surgery, ongoing or worse pain, sterility with loss of ability to ejaculate, bladder issues, adjacent segment disease. We will get preoperative medical clearance from his cardiologist and his PCP. Once we have this, we will go ahead and set up an anterior lumbar interbody fusion at L5-S1.

## 2013-07-05 NOTE — Pre-Procedure Instructions (Addendum)
Miguel Brock  07/05/2013   Your procedure is scheduled on:  7.9.14  Report to [facility] at 630 AM.  Call this number if you have problems the morning of surgery: [phone]   Remember:   Do not eat food or drink liquids after midnight.   Take these medicines the morning of surgery with A SIP OF WATER: metoprolol                    STOP  Aspirin 5 days prior to surg unless instr otherwise by dr   Drucilla Schmidt not wear jewelry, make-up or nail polish.  Do not wear lotions, powders, or perfumes. You may wear deodorant.  Do not shave 48 hours prior to surgery. Men may shave face and neck.  Do not bring valuables to the hospital.  [facility] is not responsible                   for any belongings or valuables.  Contacts, dentures or bridgework may not be worn into surgery.  Leave suitcase in the car. After surgery it may be brought to your room.  For patients admitted to the hospital, checkout time is 11:00 AM the day of  discharge.   Patients discharged the day of surgery will not be allowed to drive  home.  Name and phone number of your driver:   Special Instructions: Shower using CHG 2 nights before surgery and the night before surgery.  If you shower the day of surgery use CHG.  Use special wash - you have one bottle of CHG for all showers.  You should use approximately 1/3 of the bottle for each shower.   Please read over the following fact sheets that you were given: Pain Booklet, Coughing and Deep Breathing, Blood Transfusion Information, MRSA Information and Surgical Site Infection Prevention

## 2013-07-06 MED ORDER — CEFAZOLIN SODIUM-DEXTROSE 2-3 GM-% IV SOLR
2.0000 g | INTRAVENOUS | Status: AC
  Administered 2013-07-07: 2 g via INTRAVENOUS
  Filled 2013-07-06: qty 50

## 2013-07-07 ENCOUNTER — Encounter (HOSPITAL_COMMUNITY): Payer: Self-pay | Admitting: *Deleted

## 2013-07-07 ENCOUNTER — Ambulatory Visit (HOSPITAL_COMMUNITY): Payer: Medicare Other

## 2013-07-07 ENCOUNTER — Ambulatory Visit (HOSPITAL_COMMUNITY): Payer: Medicare Other | Admitting: *Deleted

## 2013-07-07 ENCOUNTER — Inpatient Hospital Stay (HOSPITAL_COMMUNITY)
Admission: RE | Admit: 2013-07-07 | Discharge: 2013-07-09 | DRG: 460 | Disposition: A | Discharge status: Home / Self Care | Payer: Medicare Other | Source: Ambulatory Visit | Attending: Orthopedic Surgery | Admitting: Orthopedic Surgery

## 2013-07-07 ENCOUNTER — Encounter (HOSPITAL_COMMUNITY)
Admission: RE | Disposition: A | Discharge status: Home / Self Care | Payer: Self-pay | Source: Ambulatory Visit | Attending: Orthopedic Surgery

## 2013-07-07 DIAGNOSIS — E119 Type 2 diabetes mellitus without complications: Secondary | ICD-10-CM | POA: Diagnosis present

## 2013-07-07 DIAGNOSIS — M549 Dorsalgia, unspecified: Secondary | ICD-10-CM

## 2013-07-07 DIAGNOSIS — I2510 Atherosclerotic heart disease of native coronary artery without angina pectoris: Secondary | ICD-10-CM | POA: Diagnosis present

## 2013-07-07 DIAGNOSIS — K219 Gastro-esophageal reflux disease without esophagitis: Secondary | ICD-10-CM | POA: Diagnosis present

## 2013-07-07 DIAGNOSIS — M51379 Other intervertebral disc degeneration, lumbosacral region without mention of lumbar back pain or lower extremity pain: Principal | ICD-10-CM | POA: Diagnosis present

## 2013-07-07 DIAGNOSIS — Z9861 Coronary angioplasty status: Secondary | ICD-10-CM

## 2013-07-07 DIAGNOSIS — I10 Essential (primary) hypertension: Secondary | ICD-10-CM | POA: Diagnosis present

## 2013-07-07 DIAGNOSIS — R935 Abnormal findings on diagnostic imaging of other abdominal regions, including retroperitoneum: Secondary | ICD-10-CM | POA: Diagnosis not present

## 2013-07-07 DIAGNOSIS — M5137 Other intervertebral disc degeneration, lumbosacral region: Principal | ICD-10-CM | POA: Diagnosis present

## 2013-07-07 HISTORY — PX: ANTERIOR LUMBAR FUSION: SHX1170

## 2013-07-07 HISTORY — PX: ABDOMINAL EXPOSURE: SHX5708

## 2013-07-07 LAB — GLUCOSE, CAPILLARY: Glucose-Capillary: 171 mg/dL — ABNORMAL HIGH (ref 70–99)

## 2013-07-07 LAB — HEMOGLOBIN A1C: Hgb A1c MFr Bld: 6.3 % — ABNORMAL HIGH (ref <5.7)

## 2013-07-07 SURGERY — ANTERIOR LUMBAR FUSION 1 LEVEL
Anesthesia: General | Site: Spine Lumbar | Wound class: Clean

## 2013-07-07 MED ORDER — MIDAZOLAM HCL 5 MG/5ML IJ SOLN
INTRAMUSCULAR | Status: DC | PRN
  Administered 2013-07-07: 2 mg via INTRAVENOUS

## 2013-07-07 MED ORDER — SODIUM CHLORIDE 0.9 % IJ SOLN: 9.0000 mL | INTRAMUSCULAR | Status: DC | PRN

## 2013-07-07 MED ORDER — CEFAZOLIN SODIUM 1-5 GM-% IV SOLN
1.0000 g | Freq: Three times a day (TID) | INTRAVENOUS | Status: AC
  Administered 2013-07-07 – 2013-07-08 (×2): 1 g via INTRAVENOUS
  Filled 2013-07-07 (×2): qty 50

## 2013-07-07 MED ORDER — INSULIN ASPART 100 UNIT/ML ~~LOC~~ SOLN
0.0000 [IU] | Freq: Every day | SUBCUTANEOUS | Status: DC
  Administered 2013-07-07: 2 [IU] via SUBCUTANEOUS

## 2013-07-07 MED ORDER — FENTANYL CITRATE 0.05 MG/ML IJ SOLN
INTRAMUSCULAR | Status: DC | PRN
  Administered 2013-07-07: 100 ug via INTRAVENOUS
  Administered 2013-07-07: 50 ug via INTRAVENOUS
  Administered 2013-07-07: 100 ug via INTRAVENOUS
  Administered 2013-07-07 (×3): 50 ug via INTRAVENOUS
  Administered 2013-07-07 (×2): 100 ug via INTRAVENOUS
  Administered 2013-07-07: 50 ug via INTRAVENOUS
  Administered 2013-07-07: 100 ug via INTRAVENOUS

## 2013-07-07 MED ORDER — ENOXAPARIN SODIUM 40 MG/0.4ML ~~LOC~~ SOLN
40.0000 mg | SUBCUTANEOUS | Status: DC
  Administered 2013-07-08 (×2): 40 mg via SUBCUTANEOUS
  Filled 2013-07-07 (×3): qty 0.4

## 2013-07-07 MED ORDER — HYDROCHLOROTHIAZIDE 25 MG PO TABS
25.0000 mg | ORAL_TABLET | Freq: Every day | ORAL | Status: DC
Start: 2013-07-07 — End: 2013-07-09
  Administered 2013-07-07: 25 mg via ORAL
  Filled 2013-07-07 (×3): qty 1

## 2013-07-07 MED ORDER — VECURONIUM BROMIDE 10 MG IV SOLR
INTRAVENOUS | Status: DC | PRN
  Administered 2013-07-07 (×2): 1 mg via INTRAVENOUS
  Administered 2013-07-07: 2 mg via INTRAVENOUS

## 2013-07-07 MED ORDER — METOPROLOL SUCCINATE ER 100 MG PO TB24
100.0000 mg | ORAL_TABLET | Freq: Every day | ORAL | Status: DC
  Filled 2013-07-07 (×2): qty 1

## 2013-07-07 MED ORDER — SODIUM CHLORIDE 0.9 % IJ SOLN: 3.0000 mL | INTRAMUSCULAR | Status: DC | PRN

## 2013-07-07 MED ORDER — MENTHOL 3 MG MT LOZG: 1.0000 | LOZENGE | OROMUCOSAL | Status: DC | PRN

## 2013-07-07 MED ORDER — OXYCODONE HCL 5 MG PO TABS: 5.0000 mg | ORAL_TABLET | Freq: Once | ORAL | Status: DC | PRN

## 2013-07-07 MED ORDER — PHENOL 1.4 % MT LIQD: 1.0000 | OROMUCOSAL | Status: DC | PRN

## 2013-07-07 MED ORDER — FAMOTIDINE 10 MG PO TABS
10.0000 mg | ORAL_TABLET | Freq: Two times a day (BID) | ORAL | Status: DC
  Administered 2013-07-07 – 2013-07-08 (×4): 10 mg via ORAL
  Filled 2013-07-07 (×7): qty 1

## 2013-07-07 MED ORDER — LACTATED RINGERS IV SOLN
INTRAVENOUS | Status: DC | PRN
  Administered 2013-07-07 (×2): via INTRAVENOUS

## 2013-07-07 MED ORDER — 0.9 % SODIUM CHLORIDE (POUR BTL) OPTIME
TOPICAL | Status: DC | PRN
  Administered 2013-07-07: 1000 mL

## 2013-07-07 MED ORDER — METFORMIN HCL 500 MG PO TABS
500.0000 mg | ORAL_TABLET | Freq: Two times a day (BID) | ORAL | Status: DC
  Administered 2013-07-07 – 2013-07-09 (×4): 500 mg via ORAL
  Filled 2013-07-07 (×6): qty 1

## 2013-07-07 MED ORDER — ACETAMINOPHEN 10 MG/ML IV SOLN
1000.0000 mg | Freq: Four times a day (QID) | INTRAVENOUS | Status: AC
  Administered 2013-07-07 – 2013-07-08 (×4): 1000 mg via INTRAVENOUS
  Filled 2013-07-07 (×4): qty 100

## 2013-07-07 MED ORDER — MORPHINE SULFATE (PF) 1 MG/ML IV SOLN
INTRAVENOUS | Status: DC
  Administered 2013-07-07: 12:00:00 via INTRAVENOUS

## 2013-07-07 MED ORDER — NALOXONE HCL 0.4 MG/ML IJ SOLN: 0.4000 mg | INTRAMUSCULAR | Status: DC | PRN

## 2013-07-07 MED ORDER — LACTATED RINGERS IV SOLN
INTRAVENOUS | Status: DC | PRN
  Administered 2013-07-07 (×2): via INTRAVENOUS

## 2013-07-07 MED ORDER — INSULIN ASPART 100 UNIT/ML ~~LOC~~ SOLN
0.0000 [IU] | Freq: Three times a day (TID) | SUBCUTANEOUS | Status: DC
  Administered 2013-07-08 (×3): 3 [IU] via SUBCUTANEOUS
  Administered 2013-07-09: 2 [IU] via SUBCUTANEOUS

## 2013-07-07 MED ORDER — LOSARTAN POTASSIUM 50 MG PO TABS
100.0000 mg | ORAL_TABLET | Freq: Every day | ORAL | Status: DC
  Administered 2013-07-07: 100 mg via ORAL
  Filled 2013-07-07 (×3): qty 2

## 2013-07-07 MED ORDER — ATORVASTATIN CALCIUM 80 MG PO TABS
80.0000 mg | ORAL_TABLET | Freq: Every day | ORAL | Status: DC
Start: 2013-07-07 — End: 2013-07-09
  Administered 2013-07-07 – 2013-07-08 (×2): 80 mg via ORAL
  Filled 2013-07-07 (×3): qty 1

## 2013-07-07 MED ORDER — SODIUM CHLORIDE 0.9 % IJ SOLN
3.0000 mL | Freq: Two times a day (BID) | INTRAMUSCULAR | Status: DC
  Administered 2013-07-07 – 2013-07-09 (×4): 3 mL via INTRAVENOUS

## 2013-07-07 MED ORDER — ACETAMINOPHEN 10 MG/ML IV SOLN
1000.0000 mg | Freq: Once | INTRAVENOUS | Status: AC
  Administered 2013-07-07: 1000 mg via INTRAVENOUS

## 2013-07-07 MED ORDER — FLEET ENEMA 7-19 GM/118ML RE ENEM: 1.0000 | ENEMA | Freq: Once | RECTAL | Status: AC | PRN

## 2013-07-07 MED ORDER — INSULIN ASPART 100 UNIT/ML ~~LOC~~ SOLN: 0.0000 [IU] | Freq: Three times a day (TID) | SUBCUTANEOUS | Status: DC

## 2013-07-07 MED ORDER — BUPIVACAINE-EPINEPHRINE PF 0.25-1:200000 % IJ SOLN
INTRAMUSCULAR | Status: AC
  Filled 2013-07-07: qty 30

## 2013-07-07 MED ORDER — PROPOFOL 10 MG/ML IV BOLUS
INTRAVENOUS | Status: DC | PRN
  Administered 2013-07-07: 200 mg via INTRAVENOUS

## 2013-07-07 MED ORDER — THROMBIN 20000 UNITS EX SOLR
CUTANEOUS | Status: AC
  Filled 2013-07-07: qty 20000

## 2013-07-07 MED ORDER — HYDROMORPHONE HCL PF 1 MG/ML IJ SOLN
0.2500 mg | INTRAMUSCULAR | Status: DC | PRN
  Administered 2013-07-07 (×2): 0.5 mg via INTRAVENOUS

## 2013-07-07 MED ORDER — DEXAMETHASONE SODIUM PHOSPHATE 4 MG/ML IJ SOLN
4.0000 mg | Freq: Four times a day (QID) | INTRAMUSCULAR | Status: DC
  Administered 2013-07-07: 4 mg via INTRAVENOUS
  Filled 2013-07-07 (×7): qty 1

## 2013-07-07 MED ORDER — OXYCODONE HCL 5 MG/5ML PO SOLN: 5.0000 mg | Freq: Once | ORAL | Status: DC | PRN

## 2013-07-07 MED ORDER — MORPHINE SULFATE (PF) 1 MG/ML IV SOLN
INTRAVENOUS | Status: AC
  Filled 2013-07-07: qty 25

## 2013-07-07 MED ORDER — ONDANSETRON HCL 4 MG/2ML IJ SOLN: 4.0000 mg | Freq: Four times a day (QID) | INTRAMUSCULAR | Status: DC | PRN

## 2013-07-07 MED ORDER — ZOLPIDEM TARTRATE 5 MG PO TABS
5.0000 mg | ORAL_TABLET | Freq: Every evening | ORAL | Status: DC | PRN
  Administered 2013-07-08: 5 mg via ORAL
  Filled 2013-07-07: qty 1

## 2013-07-07 MED ORDER — NEOSTIGMINE METHYLSULFATE 1 MG/ML IJ SOLN
INTRAMUSCULAR | Status: DC | PRN
  Administered 2013-07-07: 5 mg via INTRAVENOUS

## 2013-07-07 MED ORDER — EPHEDRINE SULFATE 50 MG/ML IJ SOLN
INTRAMUSCULAR | Status: DC | PRN
  Administered 2013-07-07: 10 mg via INTRAVENOUS

## 2013-07-07 MED ORDER — LACTATED RINGERS IV SOLN
INTRAVENOUS | Status: DC
  Administered 2013-07-07: 16:00:00 via INTRAVENOUS
  Administered 2013-07-08: 30 mL/h via INTRAVENOUS

## 2013-07-07 MED ORDER — LIDOCAINE HCL (CARDIAC) 20 MG/ML IV SOLN
INTRAVENOUS | Status: DC | PRN
  Administered 2013-07-07: 80 mg via INTRAVENOUS

## 2013-07-07 MED ORDER — SODIUM CHLORIDE 0.9 % IV SOLN: 250.0000 mL | INTRAVENOUS | Status: DC

## 2013-07-07 MED ORDER — OXYCODONE HCL 5 MG PO TABS
10.0000 mg | ORAL_TABLET | ORAL | Status: DC | PRN
  Administered 2013-07-07 – 2013-07-09 (×9): 10 mg via ORAL
  Filled 2013-07-07 (×9): qty 2

## 2013-07-07 MED ORDER — DEXAMETHASONE 4 MG PO TABS
4.0000 mg | ORAL_TABLET | Freq: Four times a day (QID) | ORAL | Status: DC
  Administered 2013-07-08 (×3): 4 mg via ORAL
  Filled 2013-07-07 (×7): qty 1

## 2013-07-07 MED ORDER — THROMBIN 20000 UNITS EX SOLR
CUTANEOUS | Status: DC | PRN
  Administered 2013-07-07: 09:00:00 via TOPICAL

## 2013-07-07 MED ORDER — MUPIROCIN 2 % EX OINT
TOPICAL_OINTMENT | Freq: Two times a day (BID) | CUTANEOUS | Status: DC
  Administered 2013-07-07 – 2013-07-09 (×4): via NASAL
  Filled 2013-07-07: qty 22

## 2013-07-07 MED ORDER — ACETAMINOPHEN 10 MG/ML IV SOLN
INTRAVENOUS | Status: AC
  Filled 2013-07-07: qty 100

## 2013-07-07 MED ORDER — ROCURONIUM BROMIDE 100 MG/10ML IV SOLN
INTRAVENOUS | Status: DC | PRN
  Administered 2013-07-07: 50 mg via INTRAVENOUS

## 2013-07-07 MED ORDER — HYDROMORPHONE HCL PF 1 MG/ML IJ SOLN
INTRAMUSCULAR | Status: AC
  Filled 2013-07-07: qty 1

## 2013-07-07 MED ORDER — DEXAMETHASONE SODIUM PHOSPHATE 10 MG/ML IJ SOLN
4.0000 mg | Freq: Once | INTRAMUSCULAR | Status: AC
  Administered 2013-07-07: 10 mg via INTRAVENOUS
  Filled 2013-07-07: qty 1

## 2013-07-07 MED ORDER — METHOCARBAMOL 100 MG/ML IJ SOLN
500.0000 mg | Freq: Four times a day (QID) | INTRAMUSCULAR | Status: DC | PRN
  Filled 2013-07-07: qty 5

## 2013-07-07 MED ORDER — METHOCARBAMOL 500 MG PO TABS
500.0000 mg | ORAL_TABLET | Freq: Four times a day (QID) | ORAL | Status: DC | PRN
  Administered 2013-07-08 – 2013-07-09 (×3): 500 mg via ORAL
  Filled 2013-07-07 (×4): qty 1

## 2013-07-07 MED ORDER — METOCLOPRAMIDE HCL 5 MG/ML IJ SOLN: 10.0000 mg | Freq: Once | INTRAMUSCULAR | Status: DC | PRN

## 2013-07-07 MED ORDER — GLYCOPYRROLATE 0.2 MG/ML IJ SOLN
INTRAMUSCULAR | Status: DC | PRN
  Administered 2013-07-07: 0.6 mg via INTRAVENOUS

## 2013-07-07 MED ORDER — ARTIFICIAL TEARS OP OINT
TOPICAL_OINTMENT | OPHTHALMIC | Status: DC | PRN
  Administered 2013-07-07: 1 via OPHTHALMIC

## 2013-07-07 MED ORDER — DIPHENHYDRAMINE HCL 50 MG/ML IJ SOLN: 12.5000 mg | Freq: Four times a day (QID) | INTRAMUSCULAR | Status: DC | PRN

## 2013-07-07 MED ORDER — INSULIN ASPART 100 UNIT/ML ~~LOC~~ SOLN
0.0000 [IU] | SUBCUTANEOUS | Status: DC
  Administered 2013-07-07: 3 [IU] via SUBCUTANEOUS

## 2013-07-07 MED ORDER — ONDANSETRON HCL 4 MG/2ML IJ SOLN: 4.0000 mg | INTRAMUSCULAR | Status: DC | PRN

## 2013-07-07 MED ORDER — DIPHENHYDRAMINE HCL 12.5 MG/5ML PO ELIX
12.5000 mg | ORAL_SOLUTION | Freq: Four times a day (QID) | ORAL | Status: DC | PRN
  Administered 2013-07-08: 12.5 mg via ORAL
  Filled 2013-07-07: qty 10

## 2013-07-07 MED ORDER — DOCUSATE SODIUM 100 MG PO CAPS
100.0000 mg | ORAL_CAPSULE | Freq: Two times a day (BID) | ORAL | Status: DC
  Administered 2013-07-07 – 2013-07-08 (×4): 100 mg via ORAL
  Filled 2013-07-07 (×5): qty 1

## 2013-07-07 MED ORDER — METOCLOPRAMIDE HCL 5 MG/ML IJ SOLN
INTRAMUSCULAR | Status: DC | PRN
  Administered 2013-07-07: 10 mg via INTRAVENOUS

## 2013-07-07 SURGICAL SUPPLY — 91 items
APPLIER CLIP 11 MED OPEN (CLIP) ×3
BLADE SURG 10 STRL SS (BLADE) ×3 IMPLANT
BLADE SURG ROTATE 9660 (MISCELLANEOUS) ×3 IMPLANT
CLIP APPLIE 11 MED OPEN (CLIP) ×2 IMPLANT
CLIP LIGATING EXTRA MED SLVR (CLIP) IMPLANT
CLIP LIGATING EXTRA SM BLUE (MISCELLANEOUS) IMPLANT
CLOTH BEACON ORANGE TIMEOUT ST (SAFETY) ×6 IMPLANT
CLSR STERI-STRIP ANTIMIC 1/2X4 (GAUZE/BANDAGES/DRESSINGS) ×3 IMPLANT
CORDS BIPOLAR (ELECTRODE) ×3 IMPLANT
COVER SURGICAL LIGHT HANDLE (MISCELLANEOUS) ×6 IMPLANT
DERMABOND ADVANCED (GAUZE/BANDAGES/DRESSINGS) ×2
DERMABOND ADVANCED .7 DNX12 (GAUZE/BANDAGES/DRESSINGS) ×4 IMPLANT
DRAPE C-ARM 42X72 X-RAY (DRAPES) ×6 IMPLANT
DRAPE INCISE IOBAN 66X45 STRL (DRAPES) ×3 IMPLANT
DRAPE POUCH INSTRU U-SHP 10X18 (DRAPES) ×3 IMPLANT
DRAPE SURG 17X23 STRL (DRAPES) ×3 IMPLANT
DRAPE U-SHAPE 47X51 STRL (DRAPES) ×6 IMPLANT
DRSG MEPILEX BORDER 4X8 (GAUZE/BANDAGES/DRESSINGS) ×3 IMPLANT
DURAPREP 26ML APPLICATOR (WOUND CARE) ×3 IMPLANT
ELECT BLADE 4.0 EZ CLEAN MEGAD (MISCELLANEOUS) ×3
ELECT CAUTERY BLADE 6.4 (BLADE) ×3 IMPLANT
ELECT REM PT RETURN 9FT ADLT (ELECTROSURGICAL) ×3
ELECTRODE BLDE 4.0 EZ CLN MEGD (MISCELLANEOUS) ×2 IMPLANT
ELECTRODE REM PT RTRN 9FT ADLT (ELECTROSURGICAL) ×2 IMPLANT
GAUZE SPONGE 4X4 16PLY XRAY LF (GAUZE/BANDAGES/DRESSINGS) ×3 IMPLANT
GLOVE BIOGEL PI IND STRL 6.5 (GLOVE) IMPLANT
GLOVE BIOGEL PI IND STRL 8.5 (GLOVE) ×4 IMPLANT
GLOVE BIOGEL PI INDICATOR 6.5 (GLOVE)
GLOVE BIOGEL PI INDICATOR 8.5 (GLOVE) ×2
GLOVE ECLIPSE 6.0 STRL STRAW (GLOVE) IMPLANT
GLOVE ECLIPSE 8.5 STRL (GLOVE) ×6 IMPLANT
GLOVE SS BIOGEL STRL SZ 7.5 (GLOVE) ×2 IMPLANT
GLOVE SUPERSENSE BIOGEL SZ 7.5 (GLOVE) ×1
GOWN PREVENTION PLUS XXLARGE (GOWN DISPOSABLE) ×6 IMPLANT
GOWN STRL NON-REIN LRG LVL3 (GOWN DISPOSABLE) ×9 IMPLANT
GRANULES STERILE 5CC (Bone Implant) ×3 IMPLANT
HEMOSTAT SNOW SURGICEL 2X4 (HEMOSTASIS) IMPLANT
INSERT FOGARTY 61MM (MISCELLANEOUS) IMPLANT
INSERT FOGARTY SM (MISCELLANEOUS) IMPLANT
INTERPLATE 39X14X12 (Plate) ×3 IMPLANT
KIT BASIN OR (CUSTOM PROCEDURE TRAY) ×3 IMPLANT
KIT ROOM TURNOVER OR (KITS) ×6 IMPLANT
LOOP VESSEL MAXI BLUE (MISCELLANEOUS) IMPLANT
LOOP VESSEL MINI RED (MISCELLANEOUS) IMPLANT
NEEDLE ASP BONE MRW 11GX15 J (NEEDLE) ×3 IMPLANT
NEEDLE SPNL 18GX3.5 QUINCKE PK (NEEDLE) ×3 IMPLANT
NS IRRIG 1000ML POUR BTL (IV SOLUTION) ×3 IMPLANT
PACK LAMINECTOMY ORTHO (CUSTOM PROCEDURE TRAY) ×3 IMPLANT
PACK UNIVERSAL I (CUSTOM PROCEDURE TRAY) ×3 IMPLANT
PAD ARMBOARD 7.5X6 YLW CONV (MISCELLANEOUS) ×12 IMPLANT
PEEK SPACER INTERPLAT 35X14X12 (Peek) ×3 IMPLANT
PLATE SPINAL INTERPLT 39X14X12 (Plate) ×2 IMPLANT
PUTTY BONE DBX 2.5 MIS (Bone Implant) ×3 IMPLANT
SCREW BONE STANDARD (Screw) ×9 IMPLANT
SCREW BONE STD (Screw) ×6 IMPLANT
SCREW SPINAL STD (Orthopedic Implant) ×3 IMPLANT
SPONGE INTESTINAL PEANUT (DISPOSABLE) ×12 IMPLANT
SPONGE LAP 18X18 X RAY DECT (DISPOSABLE) ×6 IMPLANT
SPONGE LAP 4X18 X RAY DECT (DISPOSABLE) IMPLANT
SPONGE SURGIFOAM ABS GEL 100 (HEMOSTASIS) ×3 IMPLANT
STAPLER VISISTAT 35W (STAPLE) IMPLANT
STRIP CLOSURE SKIN 1/2X4 (GAUZE/BANDAGES/DRESSINGS) IMPLANT
SURGIFLO TRUKIT (HEMOSTASIS) IMPLANT
SUT MNCRL AB 3-0 PS2 18 (SUTURE) ×3 IMPLANT
SUT MNCRL AB 4-0 PS2 18 (SUTURE) ×3 IMPLANT
SUT PDS AB 1 CTX 36 (SUTURE) ×3 IMPLANT
SUT PROLENE 4 0 RB 1 (SUTURE) ×4
SUT PROLENE 4-0 RB1 .5 CRCL 36 (SUTURE) ×8 IMPLANT
SUT PROLENE 5 0 C 1 24 (SUTURE) IMPLANT
SUT PROLENE 5 0 CC1 (SUTURE) IMPLANT
SUT PROLENE 6 0 C 1 30 (SUTURE) ×3 IMPLANT
SUT PROLENE 6 0 CC (SUTURE) IMPLANT
SUT SILK 0 TIES 10X30 (SUTURE) ×3 IMPLANT
SUT SILK 2 0 TIES 10X30 (SUTURE) ×6 IMPLANT
SUT SILK 2 0SH CR/8 30 (SUTURE) IMPLANT
SUT SILK 3 0 TIES 10X30 (SUTURE) ×6 IMPLANT
SUT SILK 3 0SH CR/8 30 (SUTURE) IMPLANT
SUT VIC AB 0 CT1 27 (SUTURE) ×2
SUT VIC AB 0 CT1 27XBRD ANBCTR (SUTURE) ×2 IMPLANT
SUT VIC AB 1 CT1 27 (SUTURE) ×4
SUT VIC AB 1 CT1 27XBRD ANBCTR (SUTURE) ×4 IMPLANT
SUT VIC AB 2-0 CT1 18 (SUTURE) ×3 IMPLANT
SUT VIC AB 2-0 CT1 36 (SUTURE) ×3 IMPLANT
SUT VIC AB 3-0 SH 27 (SUTURE) ×2
SUT VIC AB 3-0 SH 27X BRD (SUTURE) ×2 IMPLANT
SYR BULB IRRIGATION 50ML (SYRINGE) ×3 IMPLANT
SYR CONTROL 10ML LL (SYRINGE) ×3 IMPLANT
TOWEL OR 17X24 6PK STRL BLUE (TOWEL DISPOSABLE) ×6 IMPLANT
TOWEL OR 17X26 10 PK STRL BLUE (TOWEL DISPOSABLE) ×6 IMPLANT
TRAY FOLEY CATH 14FRSI W/METER (CATHETERS) ×3 IMPLANT
WATER STERILE IRR 1000ML POUR (IV SOLUTION) IMPLANT

## 2013-07-07 NOTE — Progress Notes (Signed)
Dr. Frederick called for a sign out 

## 2013-07-07 NOTE — Progress Notes (Signed)
UR COMPLETED  

## 2013-07-07 NOTE — Brief Op Note (Signed)
07/07/2013  11:14 AM  PATIENT:  Benay Spice  67 y.o. male  PRE-OPERATIVE DIAGNOSIS:  DDD L5-S1  POST-OPERATIVE DIAGNOSIS:  * No post-op diagnosis entered *  PROCEDURE:  Procedure(s): ANTERIOR LUMBAR FUSION 1 LEVEL ALIF L5-S1 (N/A) ABDOMINAL EXPOSURE FOR ANTERIOR LUMBAR FUSION (N/A)  SURGEON:  Surgeon(s) and Role: Panel 1:    * Venita Lick, MD - Primary  Panel 2:    * Larina Earthly, MD - Primary  PHYSICIAN ASSISTANT:   ASSISTANTS: Dr T. Early - approach surgeon   ANESTHESIA:   general  EBL:  Total I/O In: 3000 [I.V.:3000] Out: 275 [Urine:175; Blood:100]  BLOOD ADMINISTERED:none  DRAINS: none   LOCAL MEDICATIONS USED:  NONE  SPECIMEN:  No Specimen  DISPOSITION OF SPECIMEN:  N/A  COUNTS:  YES  TOURNIQUET:  * No tourniquets in log *  DICTATION: .Other Dictation: Dictation Number [id number]  PLAN OF CARE: Admit to inpatient   PATIENT DISPOSITION:  PACU - hemodynamically stable.

## 2013-07-07 NOTE — Transfer of Care (Signed)
Immediate Anesthesia Transfer of Care Note  Patient: CLYDE ZARRELLA  Procedure(s) Performed: Procedure(s): ANTERIOR LUMBAR FUSION 1 LEVEL ALIF L5-S1 (N/A) ABDOMINAL EXPOSURE FOR ANTERIOR LUMBAR FUSION (N/A)  Patient Location: PACU  Anesthesia Type:General  Level of Consciousness: awake  Airway & Oxygen Therapy: Patient Spontanous Breathing and Patient connected to face mask oxygen  Post-op Assessment: Report given to PACU RN and Post -op Vital signs reviewed and stable  Post vital signs: Reviewed and stable  Complications: No apparent anesthesia complications

## 2013-07-07 NOTE — Preoperative (Signed)
Beta Blockers   Reason not to administer Beta Blockers:Not Applicable 

## 2013-07-07 NOTE — Progress Notes (Signed)
Arterial line d/cd site held and dressed pressure dressing applied

## 2013-07-07 NOTE — Op Note (Signed)
OPERATIVE REPORT  DATE OF SURGERY: 07/07/2013  PATIENT: ROSSER COLLINGTON, 67 y.o. male MRN: [id number]  DOB: Oct 09, 1946  PRE-OPERATIVE DIAGNOSIS: L5-S1 degenerative disc disease  POST-OPERATIVE DIAGNOSIS:  Same  PROCEDURE: Anterior exposure for ALIF  SURGEON:  Gretta Began, M.D.  Co-surgeon for the exposure: Dr. Shon Baton  ANESTHESIA:  Gen.  EBL: 100 ml  Total I/O In: 3000 [I.V.:3000] Out: 275 [Urine:175; Blood:100]  BLOOD ADMINISTERED: None  DRAINS: None    COUNTS CORRECT:  YES  PLAN OF CARE: PACU   PATIENT DISPOSITION:  PACU - hemodynamically stable  PROCEDURE DETAILS: The patient was taken to the operating room placed supine position where the area of the abdomen was prepped and draped in usual sterile fashion. Lateral C-arm projection revealed the level of the L5-S1 disc. Incision was made over this area from the midline to the left lateral area in the left lower quadrant. The subcutaneous fat was opened in line with the incision. The fascia was opened in line with the skin incision with electrocautery. The rectus muscle was mobilized circumferentially. The retroperitoneum was entered bluntly below the level of the semilunar line in the left lower quadrant. The intraperitoneal contents were mobilized to the right. The posterior rectus sheath was opened and the lateral abdomen the defect continued the mobilization of intraperitoneal contents. The ureter was identified and protected and was mobilized to the right as well. Dissection plane was kept anterior to the psoas muscle. The L5-S1 disc was identified and blunt dissection was used to mobilize the iliac artery and veins over this. The middle sacral artery and veins were identified and were divided after clipping them with a Ligaclip. Further blunt dissection over thewas accomplished. The Thompson retractor was brought onto the field and the reverse 150 blades were positioned to the right and left of the L5-S1 disc. The malleable  retractors were used for superior and inferior exposure. C-arm was again brought onto the field and a needle was placed and this did confirm that this was indeed the L5-S1 disc. The remainder of the surgery will be dictated as a separate note by Dr. Serina Cowper, M.D. 07/07/2013 5:51 PM

## 2013-07-07 NOTE — Op Note (Signed)
NAME:  TORELL, MINDER.:  [id number]  MEDICAL RECORD NO.:  [id number]  LOCATION:  [id number]                        FACILITY:  [facility]  PHYSICIAN:  Alvy Beal, MD    DATE OF BIRTH:  [birth year]  DATE OF PROCEDURE: DATE OF DISCHARGE:                              OPERATIVE REPORT   PREOPERATIVE DIAGNOSIS:  Degenerative lumbar disk disease with discogenic back pain, L5-S1.  POSTOPERATIVE DIAGNOSIS:  Degenerative lumbar disk disease with discogenic back pain, L5-S1.  OPERATIVE PROCEDURE:  Anterior lumbar interbody fusion, L5-S1.  APPROACH SURGEON:  Larina Earthly, M.D.  INSTRUMENTATION SYSTEM USED:  RSV inner plate PEEK cage packed with DBX mix plus chronOS and then anterior lumbar interbody plating with the RSV inner plate with 25 mm length screws.  Size of the interbody cage was a 14 x 12 cage.  COMPLICATIONS:  None.  CONDITION:  Stable.  HISTORY:  This is a very pleasant gentleman who has been having progressive debilitating back, buttock, and bilateral leg pain.  The patient states that this has been going on for some time.  Prior to seeing me, he had been evaluated by a chiropractor, physical therapy, narcotic and nonnarcotic medications, activity modification, and injection therapy.  Despite these conservative modalities, his quality of life and pain continued to deteriorate.  As a result, we elected to proceed with surgery.  All appropriate risks, benefits, and alternatives were discussed with the patient and consent was obtained.  OPERATIVE NOTE:  The patient was brought to the operating room, placed supine on the operating table.  After successful induction of general anesthesia and endotracheal intubation, TEDs, SCDs, and Foley were inserted.  The abdomen was prepped and draped in a standard fashion. Time-out was done confirming patient, procedure, and all other pertinent important data.  Dr. Arbie Cookey then scrubbed into the case and performed  a standard retroperitoneal anterior approach to the lumbar spine.  Please refer to his dictation for specifics on the approach.  Once we had the approach and the retractors placed, the needle was placed in the 5-1 disk space and confirmed that we were at the appropriate level.  Once this was done, I then performed an annulotomy with a #10 blade scalpel and then used a combination of pituitary rongeurs, curettes, and Kerrison rongeurs to effectively remove all the disk material at the L5-S1 disk space level.  I then used spreaders to distract and then removed the posterior osteophytes from L5 and S1 with 2-mm Kerrison rongeur.  At this point, I could easily run a nerve hook behind the bodies of L5 and S1.  I had an adequate diskectomy.  I then trialed a 12 and 14 interbody spacer and elected to use a 14 x 12 interbody spacer.  We obtained this, it was packed with appropriate bone graft material, which was chronOS.  I did aspirate 5 mL of blood from the S1 vertebral body, and did mix it appropriately with the chronOS. The chronOS and DBX were packed into the cage.  The cage was then malleted down to the appropriate depth.  Care was taken to make sure it was countersunk.  Once it was done, I took the remaining  bone graft, placed it along the anterior surface of the cage and then took the anterior plate and malleted down to the appropriate depth.  I then used an awl and placed 2 screws into the body of L5, one screw into the body of S1.  I then tightened all the screws down and each had excellent purchase.  I then took the locking cap, secured it on top and torqued it down appropriately according to manufacturers standards.  I then irrigated copiously with normal saline, and then sequentially removed the retractors ensuring that there was no bleeding.  Once this was done, I closed the fascia of the rectus with running Prolene suture.  I then closed superficial with 2-0 Vicryl sutures and a  3-0 Monocryl for the skin.  Steri-Strips and dry dressing were applied.  The patient was extubated and transferred to the PACU without incident.  At the end of the case, all needle and sponge counts were correct.  There were no adverse intraoperative events.     Alvy Beal, MD     DDB/MEDQ  D:  07/07/2013  T:  07/07/2013  Job:  [id number]

## 2013-07-07 NOTE — Anesthesia Preprocedure Evaluation (Addendum)
Anesthesia Evaluation  Patient identified by MRN, date of birth, ID band Patient awake    Reviewed: Allergy & Precautions, H&P , NPO status , Patient's Chart, lab work & pertinent test results, reviewed documented beta blocker date and time   Airway Mallampati: II TM Distance: >3 FB Neck ROM: full    Dental  (+) Teeth Intact and Dental Advisory Given   Pulmonary pneumonia -, resolved,  breath sounds clear to auscultation        Cardiovascular hypertension, On Medications, On Home Beta Blockers and Pt. on medications + angina + CAD and + Cardiac Stents Rhythm:regular     Neuro/Psych negative neurological ROS  negative psych ROS   GI/Hepatic hiatal hernia, GERD-  Medicated and Controlled,(+) Hepatitis -, Unspecified  Endo/Other  diabetes, Type 2, Oral Hypoglycemic Agents  Renal/GU negative Renal ROS  negative genitourinary   Musculoskeletal   Abdominal   Peds  Hematology negative hematology ROS (+)   Anesthesia Other Findings See surgeon's H&P   Reproductive/Obstetrics negative OB ROS                          Anesthesia Physical Anesthesia Plan  ASA: III  Anesthesia Plan: General   Post-op Pain Management:    Induction: Intravenous  Airway Management Planned: Oral ETT  Additional Equipment: Arterial line and CVP  Intra-op Plan:   Post-operative Plan: Extubation in OR  Informed Consent: I have reviewed the patients History and Physical, chart, labs and discussed the procedure including the risks, benefits and alternatives for the proposed anesthesia with the patient or authorized representative who has indicated his/her understanding and acceptance.   Dental Advisory Given and Dental advisory given  Plan Discussed with: CRNA, Surgeon and Anesthesiologist  Anesthesia Plan Comments:        Anesthesia Quick Evaluation

## 2013-07-07 NOTE — H&P (Signed)
No change to clinical exam H+P reviewed 

## 2013-07-07 NOTE — Anesthesia Procedure Notes (Signed)
Procedure Name: Intubation Date/Time: 07/07/2013 9:04 AM Performed by: Brien Mates DOBSON Pre-anesthesia Checklist: Patient identified, Emergency Drugs available, Suction available, Patient being monitored and Timeout performed Patient Re-evaluated:Patient Re-evaluated prior to inductionOxygen Delivery Method: Circle system utilized Preoxygenation: Pre-oxygenation with 100% oxygen Intubation Type: IV induction Ventilation: Mask ventilation without difficulty and Oral airway inserted - appropriate to patient size Laryngoscope Size: Miller and 2 Grade View: Grade I Tube type: Oral Tube size: 8.0 mm Number of attempts: 1 Airway Equipment and Method: Stylet Placement Confirmation: ETT inserted through vocal cords under direct vision,  positive ETCO2 and breath sounds checked- equal and bilateral Secured at: 22 cm Tube secured with: Tape Dental Injury: Teeth and Oropharynx as per pre-operative assessment

## 2013-07-08 ENCOUNTER — Inpatient Hospital Stay (HOSPITAL_COMMUNITY): Payer: Medicare Other

## 2013-07-08 ENCOUNTER — Encounter (HOSPITAL_COMMUNITY): Payer: Self-pay | Admitting: Orthopedic Surgery

## 2013-07-08 DIAGNOSIS — Z09 Encounter for follow-up examination after completed treatment for conditions other than malignant neoplasm: Secondary | ICD-10-CM

## 2013-07-08 LAB — GLUCOSE, CAPILLARY
Glucose-Capillary: 188 mg/dL — ABNORMAL HIGH (ref 70–99)
Glucose-Capillary: 158 mg/dL — ABNORMAL HIGH (ref 70–99)

## 2013-07-08 MED ORDER — MAGNESIUM HYDROXIDE 400 MG/5ML PO SUSP
30.0000 mL | Freq: Every day | ORAL | Status: DC | PRN
  Administered 2013-07-08: 30 mL via ORAL
  Filled 2013-07-08: qty 30

## 2013-07-08 MED ORDER — FLEET ENEMA 7-19 GM/118ML RE ENEM
1.0000 | ENEMA | Freq: Every day | RECTAL | Status: DC | PRN
  Administered 2013-07-08: 18:00:00 via RECTAL
  Filled 2013-07-08: qty 1

## 2013-07-08 MED FILL — Heparin Sodium (Porcine) Inj 1000 Unit/ML: INTRAMUSCULAR | Qty: 30 | Status: AC

## 2013-07-08 MED FILL — Sodium Chloride Irrigation Soln 0.9%: Qty: 3000 | Status: AC

## 2013-07-08 MED FILL — Sodium Chloride IV Soln 0.9%: INTRAVENOUS | Qty: 1000 | Status: AC

## 2013-07-08 NOTE — Anesthesia Postprocedure Evaluation (Signed)
Anesthesia Post Note  Patient: Miguel Brock  Procedure(s) Performed: Procedure(s) (LRB): ANTERIOR LUMBAR FUSION 1 LEVEL ALIF L5-S1 (N/A) ABDOMINAL EXPOSURE FOR ANTERIOR LUMBAR FUSION (N/A)  Anesthesia type: General  Patient location: PACU  Post pain: Pain level controlled  Post assessment: Patient's Cardiovascular Status Stable  Last Vitals:  Filed Vitals:   07/08/13 0704  BP: 103/71  Pulse: 69  Temp: 36.7 C  Resp: 16    Post vital signs: Reviewed and stable  Level of consciousness: alert  Complications: No apparent anesthesia complications

## 2013-07-08 NOTE — Progress Notes (Signed)
    Subjective: Procedure(s) (LRB): ANTERIOR LUMBAR FUSION 1 LEVEL ALIF L5-S1 (N/A) ABDOMINAL EXPOSURE FOR ANTERIOR LUMBAR FUSION (N/A) 1 Day Post-Op  Patient reports pain as 3 on 0-10 scale.  Reports decreased leg pain reports incisional back pain   Positive void Negative bowel movement Positive flatus Negative chest pain or shortness of breath  Objective: Vital signs in last 24 hours: Temp:  [98 F (36.7 C)-98.2 F (36.8 C)] 98.1 F (36.7 C) (07/10 0704) Pulse Rate:  [65-79] 65 (07/10 1040) Resp:  [12-18] 16 (07/10 0704) BP: (103-126)/(52-71) 109/57 mmHg (07/10 1040) SpO2:  [96 %-99 %] 97 % (07/10 0704)  Intake/Output from previous day: 07/09 0701 - 07/10 0700 In: 4483 [P.O.:480; I.V.:4003] Out: 775 [Urine:675; Blood:100]  Labs:  Recent Labs  07/05/13 1347  WBC 9.5  RBC 4.32  HCT 40.5  PLT 239    Recent Labs  07/05/13 1347  NA 138  K 3.3*  CL 99  CO2 27  BUN 22  CREATININE 1.23  GLUCOSE 170*  CALCIUM 9.4   No results found for this basename: LABPT, INR,  in the last 72 hours  Physical Exam: Neurologically intact ABD soft Intact pulses distally Incision: dressing C/D/I and no drainage Compartment soft  Assessment/Plan: Patient stable  xrays pending Negative DVT scan Continue mobilization with physical therapy Continue care  Advance diet Up with therapy Discharge home with home health  Friday or Saturday  Venita Lick, MD [address] [facility] [phone]

## 2013-07-08 NOTE — Evaluation (Signed)
Physical Therapy Evaluation Patient Details Name: Miguel Brock MRN: [id number] DOB: [birth year] Today's Date: 07/08/2013 Time: 4098-1191 PT Time Calculation (min): 39 min  PT Assessment / Plan / Recommendation History of Present Illness  Pt. underwent ALIF at  L5-S1 for lumbar DDD, discogenic pain   Clinical Impression                                        Pt. Is highly motivated and did well with his first session this am.  He presents with decreased mobility and gait following his back surgery and needs acute PT to address these issues.  Anticipate good progress for DC home.      PT Assessment  Patient needs continued PT services    Follow Up Recommendations  No PT follow up;Supervision/Assistance - 24 hour;Supervision for mobility/OOB    Does the patient have the potential to tolerate intense rehabilitation      Barriers to Discharge        Equipment Recommendations  Rolling walker with 5" wheels;3in1 (PT)    Recommendations for Other Services     Frequency Min 6X/week    Precautions / Restrictions Precautions Precautions: Back Precaution Booklet Issued: Yes (comment) Precaution Comments: reviewed back precautions and log rolling technique Required Braces or Orthoses: Spinal Brace Spinal Brace: Lumbar corset;Applied in sitting position Restrictions Weight Bearing Restrictions: No   Pertinent Vitals/Pain See vitals tab       Mobility  Bed Mobility Bed Mobility: Rolling Right;Right Sidelying to Sit;Sitting - Scoot to [place] of Bed;Sit to Supine Rolling Right: 5: Supervision Right Sidelying to Sit: 4: Min assist;HOB flat Sitting - Scoot to Edge of Bed: 5: Supervision Sit to Supine: Not Tested (comment) Details for Bed Mobility Assistance: cues for technique and hand placement and for maintaining back precautions during task Transfers Transfers: Sit to Stand;Stand to Sit Sit to Stand: 4: Min guard;From bed;With upper extremity assist Stand to Sit: 4: Min  guard;With upper extremity assist;With armrests;To chair/3-in-1 Details for Transfer Assistance: cues for hand placment and technique Ambulation/Gait Ambulation/Gait Assistance: 4: Min guard Ambulation Distance (Feet): 150 Feet Assistive device: Rolling walker Ambulation/Gait Assistance Details: frequent cues to maintain erect posture and reduce reliance on UEs with RW Gait Pattern: Step-through pattern;Trunk flexed Stairs: Yes Stairs Assistance: 4: Min assist Stairs Assistance Details (indicate cue type and reason): hand hold assist for balaance and stability needed Stair Management Technique: No rails;Forwards Number of Stairs: 2    Exercises     PT Diagnosis: Difficulty walking;Abnormality of gait;Acute pain  PT Problem List: Decreased activity tolerance;Decreased mobility;Decreased knowledge of use of DME;Decreased knowledge of precautions;Pain PT Treatment Interventions: DME instruction;Gait training;Stair training;Functional mobility training;Therapeutic activities;Patient/family education     PT Goals(Current goals can be found in the care plan section) Acute Rehab PT Goals Patient Stated Goal: to be as independent as possible and return to riding motorcycles PT Goal Formulation: With patient Time For Goal Achievement: 07/15/13 Potential to Achieve Goals: Good  Visit Information  Last PT Received On: 07/08/13 Assistance Needed: +1 History of Present Illness: Pt. underwent ALIF at  L5-S1 for lumbar DDD, discogenic pain        Prior Functioning  Home Living Family/patient expects to be discharged to:: Private residence Living Arrangements: Spouse/significant other Available Help at Discharge: Family;Available 24 hours/day Type of Home: House Home Access: Stairs to enter [employer] of Steps: 2 Entrance Stairs-Rails: None  Home Layout: One level Home Equipment: Walker - 2 wheels;Cane - single point;Hand held shower head Prior Function Level of  Independence: Independent Communication Communication: No difficulties    Cognition  Cognition Arousal/Alertness: Awake/alert Behavior During Therapy: WFL for tasks assessed/performed Overall Cognitive Status: Within Functional Limits for tasks assessed    Extremity/Trunk Assessment Upper Extremity Assessment Upper Extremity Assessment: Defer to OT evaluation Lower Extremity Assessment Lower Extremity Assessment: Overall WFL for tasks assessed Cervical / Trunk Assessment Cervical / Trunk Assessment: Normal   Balance    End of Session PT - End of Session Equipment Utilized During Treatment: Gait belt;Back brace Activity Tolerance: Patient tolerated treatment well Patient left: in chair;with call bell/phone within reach Nurse Communication: Mobility status  GP     Ferman Hamming 07/08/2013, 10:49 AM Wel[place] Picking PT Acute Rehab Services [phone] [facility] [phone]

## 2013-07-08 NOTE — Progress Notes (Signed)
Occupational Therapy Evaluation Patient Details Name: Miguel Brock MRN: [id number] DOB: [birth year] Today's Date: 07/08/2013 Time: 4098-1191 OT Time Calculation (min): 16 min  OT Assessment / Plan / Recommendation History of present illness Pt. underwent ALIF at  L5-S1 for lumbar DDD, discogenic pain    Clinical Impression   Pt making excellent progress. Will plan to see in am for continuing education prior to D/C. Pt will have adequate support for home D/C.     OT Assessment  Patient needs continued OT Services    Follow Up Recommendations  No OT follow up    Barriers to Discharge      Equipment Recommendations  None recommended by OT    Recommendations for Other Services    Frequency  Min 2X/week    Precautions / Restrictions Precautions Precautions: Back Precaution Booklet Issued: Yes (comment) Precaution Comments: reviewed back precautions and log rolling technique Required Braces or Orthoses: Spinal Brace Spinal Brace: Lumbar corset;Applied in sitting position Restrictions Weight Bearing Restrictions: No   Pertinent Vitals/Pain no apparent distress     ADL  Grooming: Supervision/safety Where Assessed - Grooming: Unsupported standing Upper Body Bathing: Set up Where Assessed - Upper Body Bathing: Unsupported sitting Lower Body Bathing: Moderate assistance Where Assessed - Lower Body Bathing: Supported sit to stand Upper Body Dressing: Set up;Supervision/safety Where Assessed - Upper Body Dressing: Unsupported sitting Lower Body Dressing: Moderate assistance Where Assessed - Lower Body Dressing: Supported sit to stand Toilet Transfer: Hydrographic surveyor Method: Sit to Barista: Bedside commode Equipment Used: Back brace;Gait belt Transfers/Ambulation Related to ADLs: S ADL Comments: Educated pt on back precautions for ADL and began educaiton on AE for ADL. discussed use of tongs for hygiene after toileting. Also discussed  availability of tongs. Pt able to bring his feet up to knees, but appears to flex trunk.     OT Diagnosis: Acute pain;Generalized weakness  OT Problem List: Decreased activity tolerance;Decreased knowledge of use of DME or AE;Decreased knowledge of precautions;Pain OT Treatment Interventions: Self-care/ADL training;Therapeutic activities;Patient/family education   OT Goals(Current goals can be found in the care plan section) Acute Rehab OT Goals Patient Stated Goal: to be as independent as possible and return to riding motorcycles OT Goal Formulation: With patient Time For Goal Achievement: 07/15/13 Potential to Achieve Goals: Good ADL Goals Pt Will Perform Lower Body Dressing: with supervision;with set-up;with adaptive equipment;sit to/from stand Pt Will Transfer to Toilet: with modified independence;ambulating;bedside commode Pt Will Perform Toileting - Clothing Manipulation and hygiene: with modified independence;with adaptive equipment;sit to/from stand Additional ADL Goal #1: independently states 3/3 back precautions  Visit Information  Last OT Received On: 07/01/13 Assistance Needed: +1 History of Present Illness: Pt. underwent ALIF at  L5-S1 for lumbar DDD, discogenic pain        Prior Functioning     Home Living Family/patient expects to be discharged to:: Private residence Living Arrangements: Spouse/significant other Available Help at Discharge: Family;Available 24 hours/day Type of Home: House Home Access: Stairs to enter [employer] of Steps: 2 Entrance Stairs-Rails: None Home Layout: One level Home Equipment: Walker - 2 wheels;Cane - single point;Hand held shower head;Shower seat;Bedside commode Prior Function Level of Independence: Independent Communication Communication: No difficulties         Vision/Perception     Cognition  Cognition Arousal/Alertness: Awake/alert Behavior During Therapy: WFL for tasks assessed/performed Overall  Cognitive Status: Within Functional Limits for tasks assessed    Extremity/Trunk Assessment Upper Extremity Assessment Upper Extremity Assessment: Overall  WFL for tasks assessed Lower Extremity Assessment Lower Extremity Assessment: Overall WFL for tasks assessed Cervical / Trunk Assessment Cervical / Trunk Assessment: Normal     Mobility Bed Mobility Bed Mobility: Sit to Supine Rolling Right: 5: Supervision Transfers Transfers: Sit to Stand;Stand to Sit Sit to Stand: 5: Supervision Stand to Sit: 5: Supervision Details for Transfer Assistance: cues for precautions     Exercise     Balance     End of Session OT - End of Session Equipment Utilized During Treatment: Back brace Activity Tolerance: Patient tolerated treatment well Patient left: in chair;with call bell/phone within reach Nurse Communication: Mobility status  GO     Trayquan Kolakowski,HILLARY 07/08/2013, 5:47 PM [facility], OTR/L  [phone] 07/08/2013

## 2013-07-08 NOTE — Progress Notes (Signed)
Patient ID: Miguel Brock, male   DOB: 10-Jan-1946, 67 y.o.   MRN: [id number] The patient is awake and alert. He is his usual animated self. He denies any significant pain with some mild soreness in his lower abdomen and back. His dressing is intact he has no hematoma and minimal tenderness. Pulses are intact.  Stable postop day 1 from anterior exposure for L5-S1 disc treatment. Will not follow actively. Please call us if we can provide any assistance

## 2013-07-08 NOTE — Progress Notes (Signed)
*  Preliminary Results* Bilateral lower extremity venous duplex completed. Bilateral lower extremities are negative for deep vein thrombosis. There is no evidence of Baker's cyst bilaterally.  07/08/2013  Gertie Fey, RVT, RDCS, RDMS

## 2013-07-09 LAB — GLUCOSE, CAPILLARY: Glucose-Capillary: 149 mg/dL — ABNORMAL HIGH (ref 70–99)

## 2013-07-09 MED ORDER — ONDANSETRON HCL 4 MG PO TABS: 4.0000 mg | ORAL_TABLET | Freq: Three times a day (TID) | ORAL | Status: DC | PRN

## 2013-07-09 MED ORDER — DOCUSATE SODIUM 100 MG PO CAPS: 100.0000 mg | ORAL_CAPSULE | Freq: Three times a day (TID) | ORAL | Status: DC | PRN

## 2013-07-09 MED ORDER — METHOCARBAMOL 500 MG PO TABS: 500.0000 mg | ORAL_TABLET | Freq: Three times a day (TID) | ORAL | Status: DC | PRN

## 2013-07-09 MED ORDER — OXYCODONE-ACETAMINOPHEN 10-325 MG PO TABS: 1.0000 | ORAL_TABLET | ORAL | Status: DC | PRN

## 2013-07-09 MED ORDER — POLYETHYLENE GLYCOL 3350 17 GM/SCOOP PO POWD: 17.0000 g | Freq: Every day | ORAL | Status: DC

## 2013-07-09 MED ORDER — ENOXAPARIN SODIUM 40 MG/0.4ML ~~LOC~~ SOLN: 40.0000 mg | SUBCUTANEOUS | Status: DC

## 2013-07-09 NOTE — Discharge Summary (Signed)
Patient ID: Miguel Brock MRN: [id number] DOB/AGE: [birth year] 67 y.o.  Admit date: 07/07/2013 Discharge date: 07/09/2013  Admission Diagnoses:  Active Problems:   * No active hospital problems. *   Discharge Diagnoses:  Active Problems:   * No active hospital problems. *  status post Procedure(s): ANTERIOR LUMBAR FUSION 1 LEVEL ALIF L5-S1 ABDOMINAL EXPOSURE FOR ANTERIOR LUMBAR FUSION  Past Medical History  Diagnosis Date  . Anginal pain     occ  . Coronary artery disease 11    stents x5  . Hypertension   . Pneumonia     hx  . Diabetes mellitus without complication   . GERD (gastroesophageal reflux disease)     zantac  . H/O hiatal hernia   . Hepatitis 70's    food     Surgeries: Procedure(s): ANTERIOR LUMBAR FUSION 1 LEVEL ALIF L5-S1 ABDOMINAL EXPOSURE FOR ANTERIOR LUMBAR FUSION on 07/07/2013   Consultants:  none  Discharged Condition: Improved  Hospital Course: Miguel Brock is an 67 y.o. male who was admitted 07/07/2013 for operative treatment of <principal problem not specified>. Patient failed conservative treatments (please see the history and physical for the specifics) and had severe unremitting pain that affects sleep, daily activities and work/hobbies. After pre-op clearance, the patient was taken to the operating room on 07/07/2013 and underwent  Procedure(s): ANTERIOR LUMBAR FUSION 1 LEVEL ALIF L5-S1 ABDOMINAL EXPOSURE FOR ANTERIOR LUMBAR FUSION.    Patient was given perioperative antibiotics: Anti-infectives   Start     Dose/Rate Route Frequency Ordered Stop   07/07/13 1700  ceFAZolin (ANCEF) IVPB 1 g/50 mL premix     1 g 100 mL/hr over 30 Minutes Intravenous Every 8 hours 07/07/13 1352 07/08/13 0125   07/06/13 1442  ceFAZolin (ANCEF) IVPB 2 g/50 mL premix     2 g 100 mL/hr over 30 Minutes Intravenous 30 min pre-op 07/06/13 1442 07/07/13 0900       Patient was given sequential compression devices and early ambulation to prevent DVT.   Patient  benefited maximally from hospital stay and there were no complications. At the time of discharge, the patient was urinating/moving their bowels without difficulty, tolerating a regular diet, pain is controlled with oral pain medications and they have been cleared by PT/OT.   Recent vital signs: Patient Vitals for the past 24 hrs:  BP Temp Pulse Resp SpO2  07/09/13 0441 114/58 mmHg 98 F (36.7 C) 75 14 93 %  07/08/13 2119 128/59 mmHg 97.9 F (36.6 C) 60 16 95 %  07/08/13 1300 132/70 mmHg 97.9 F (36.6 C) 70 17 95 %  07/08/13 1040 109/57 mmHg - 65 - -     Recent laboratory studies: No results found for this basename: WBC, HGB, HCT, PLT, NA, K, CL, CO2, BUN, CREATININE, GLUCOSE, PT, INR, CALCIUM, 2,  in the last 72 hours   Discharge Medications:     Medication List    STOP taking these medications       acetaminophen 325 MG tablet  Commonly known as:  TYLENOL     aspirin 325 MG EC tablet      TAKE these medications       atorvastatin 80 MG tablet  Commonly known as:  LIPITOR  Take 80 mg by mouth daily.     docusate sodium 100 MG capsule  Commonly known as:  COLACE  Take 1 capsule (100 mg total) by mouth 3 (three) times daily as needed for constipation.  enoxaparin 40 MG/0.4ML injection  Commonly known as:  LOVENOX  Inject 0.4 mLs (40 mg total) into the skin daily.     hydrochlorothiazide 25 MG tablet  Commonly known as:  HYDRODIURIL  Take 25 mg by mouth daily.     losartan 100 MG tablet  Commonly known as:  COZAAR  Take 100 mg by mouth daily.     metFORMIN 500 MG tablet  Commonly known as:  GLUCOPHAGE  Take 500 mg by mouth 2 (two) times daily with a meal.     methocarbamol 500 MG tablet  Commonly known as:  ROBAXIN  Take 1 tablet (500 mg total) by mouth 3 (three) times daily as needed.     metoprolol succinate 100 MG 24 hr tablet  Commonly known as:  TOPROL-XL  Take 100 mg by mouth daily. Take with or immediately following a meal.     ondansetron 4 MG  tablet  Commonly known as:  ZOFRAN  Take 1 tablet (4 mg total) by mouth every 8 (eight) hours as needed for nausea.     oxyCODONE-acetaminophen 10-325 MG per tablet  Commonly known as:  PERCOCET  Take 1 tablet by mouth every 4 (four) hours as needed for pain.     polyethylene glycol powder powder  Commonly known as:  GLYCOLAX  Take 17 g by mouth daily.     ranitidine 150 MG tablet  Commonly known as:  ZANTAC  Take 150 mg by mouth as needed for heartburn.     Vitamin D 2000 UNITS Caps  Take 1 capsule by mouth every 30 (thirty) days.        Diagnostic Studies: Dg Lumbar Spine 2-3 Views  07/08/2013   *RADIOLOGY REPORT*  Clinical Data: Follow-up lumbar fusion 07/07/2013.  LUMBAR SPINE - 2-3 VIEW  Comparison: Lumbar spine radiographs 07/05/2013.  Intraoperative radiographs 07/07/2013.  Findings: The lumbar alignment is stable status post anterior fusion at L5-S1.  The surgical hardware appears unchanged.  Mild anterior wedging of the L4 vertebral body and mild paraspinal osteophyte formation are stable.  IMPRESSION: Intact hardware and stable alignment status post L5-S1 ALIF.  No acute findings.   Original Report Authenticated By: Carey Bullocks, M.D.   Dg Lumbar Spine 2-3 Views  07/07/2013   *RADIOLOGY REPORT*  Clinical Data: Fusion L5-S1.  LUMBAR SPINE - 2-3 VIEW  Fluoroscopic time:  34 seconds.  Comparison: 07/05/2013 plain film examination.  Findings: Two-view intraoperative views of the lumbar spine submitted for review after surgery.  Fusion L5-S1 via anterior approach.  Surgical clips anterior to the fused level probably vascular in origin.  No complication noted on this limited exam.  IMPRESSION: Fusion L5-S1 via anterior approach.  Please see above.   Original Report Authenticated By: Lacy Duverney, M.D.   Dg Lumbar Spine 2-3 Views  07/05/2013   *RADIOLOGY REPORT*  Clinical Data: Lumbar disc disease.  LUMBAR SPINE - 2-3 VIEW  Comparison: None.  Findings: The patient has five lumbar  segments.  There is narrowing of the L5-S1 disc space.  No spondylolisthesis. Facet arthritis at L3-4 and L4-5 on the left.  IMPRESSION: Degenerative disc and joint disease as described.   Original Report Authenticated By: Francene Boyers, M.D.   Dg C-arm Gt 120 Min  07/07/2013   Technique:  C-arm fluoroscopic images were obtained intraoperatively and submitted for postoperative interpretation. Please see the performing provider's procedural report for the fluoroscopy time utilized.   Original Report Authenticated By: Davonna Belling, M.D.   Dg Or Local Abdomen  07/07/2013   *RADIOLOGY REPORT*  Clinical Data: Anterior lumbar fusion.  Instrument count.  OR LOCAL ABDOMEN  Comparison: Lumbar spine series 07/05/2013.  Findings: No unexpected radiopaque foreign body.  Postoperative changes are seen at L5-S1.  IMPRESSION:  1.  No unexpected radiopaque foreign body. 2.  Postoperative changes of L5-S1 fusion.   Original Report Authenticated By: Leanna Battles, M.D.          Follow-up Information   Follow up with Alvy Beal, MD. Call in 2 weeks.   Contact information:   [address] [place] [address] [phone]       Discharge Plan:  discharge to home  Disposition: stable    Signed: Venita Lick D for Dr. Venita Lick [facility] [phone] 07/09/2013, 7:58 AM

## 2013-07-09 NOTE — Progress Notes (Signed)
D/C instructions and scripts given to Pt. Pt wife at bedside to take pt home.

## 2013-07-09 NOTE — Progress Notes (Signed)
Physical Therapy Treatment/ DC NOTE Patient Details Name: Miguel Brock MRN: [id number] DOB: 1946/12/10 Today's Date: 07/09/2013 Time: 7829-5621 PT Time Calculation (min): 32 min  PT Assessment / Plan / Recommendation  PT Comments   Pt. Has met goals and all education is complete.  He has DC orders home for this am. Mobilizing at mod I level with RW.   Follow Up Recommendations  No PT follow up;Supervision/Assistance - 24 hour;Supervision for mobility/OOB     Does the patient have the potential to tolerate intense rehabilitation     Barriers to Discharge        Equipment Recommendations  Rolling walker with 5" wheels;3in1 (PT)    Recommendations for Other Services    Frequency Min 6X/week   Progress towards PT Goals Progress towards PT goals: Goals met/education completed, patient discharged from PT (pt. did need reinforcement with back precautions)  Plan Current plan remains appropriate    Precautions / Restrictions Precautions Precautions: Back Precaution Comments: reviewed back precautions and log rolling technique; pt. able to state 2/3 precautions; reinforced knowledge of precautions and encouraged adherence Required Braces or Orthoses: Spinal Brace Spinal Brace: Lumbar corset;Applied in sitting position Restrictions Weight Bearing Restrictions: No   Pertinent Vitals/Pain See vitals tab     Mobility  Bed Mobility Bed Mobility: Rolling Right;Right Sidelying to Sit Rolling Right: 6: Modified independent (Device/Increase time) Right Sidelying to Sit: 6: Modified independent (Device/Increase time);HOB flat Sitting - Scoot to Edge of Bed: 6: Modified independent (Device/Increase time) Sit to Supine: Not Tested (comment) Details for Bed Mobility Assistance: cues to maintain straight spine in rolling Transfers Transfers: Sit to Stand;Stand to Sit Sit to Stand: 6: Modified independent (Device/Increase time);From bed;With upper extremity assist Stand to Sit: 6: Modified  independent (Device/Increase time);To bed;With upper extremity assist Details for Transfer Assistance: cues for keeping spine as straight as possible Ambulation/Gait Ambulation/Gait Assistance: 6: Modified independent (Device/Increase time) Ambulation Distance (Feet): 350 Feet Assistive device: Rolling walker Ambulation/Gait Assistance Details: managing well with no overt LOB on RW. Gait Pattern: Step-through pattern Gait velocity: slightly decreased Stairs: Yes Stairs Assistance: 6: Modified independent (Device/Increase time) Stairs Assistance Details (indicate cue type and reason): pt. has decided to enter at another entrance where he has a handrail.  He was able to negotiate 3 steps with right rail at mod I level Stair Management Technique: One rail Right;Forwards Number of Stairs: 3    Exercises     PT Diagnosis:    PT Problem List:   PT Treatment Interventions:     PT Goals (current goals can now be found in the care plan section) Acute Rehab PT Goals Patient Stated Goal: to be as independent as possible and return to riding motorcycles  Visit Information  Last PT Received On: 07/09/13 Assistance Needed: +1 History of Present Illness: Pt. underwent ALIF at  L5-S1 for lumbar DDD, discogenic pain     Subjective Data  Subjective: Pt. syas he is ready to go home today Patient Stated Goal: to be as independent as possible and return to riding motorcycles   Cognition  Cognition Arousal/Alertness: Awake/alert Behavior During Therapy: WFL for tasks assessed/performed Overall Cognitive Status: Within Functional Limits for tasks assessed    Balance     End of Session PT - End of Session Equipment Utilized During Treatment: Gait belt;Back brace Activity Tolerance: Patient tolerated treatment well Patient left: in bed;with call bell/phone within reach Nurse Communication: Mobility status   GP     Ferman Hamming 07/09/2013,  8:45 AM [place] Picking PT Acute Rehab  Services [phone] [facility] [phone]

## 2013-07-09 NOTE — Progress Notes (Signed)
    Subjective: Procedure(s) (LRB): ANTERIOR LUMBAR FUSION 1 LEVEL ALIF L5-S1 (N/A) ABDOMINAL EXPOSURE FOR ANTERIOR LUMBAR FUSION (N/A) 2 Days Post-Op  Patient reports pain as 2 on 0-10 scale.  Reports decreased leg pain reports incisional back pain   Positive void Positive bowel movement Positive flatus Negative chest pain or shortness of breath  Objective: Vital signs in last 24 hours: Temp:  [97.9 F (36.6 C)-98 F (36.7 C)] 98 F (36.7 C) (07/11 0441) Pulse Rate:  [60-75] 75 (07/11 0441) Resp:  [14-17] 14 (07/11 0441) BP: (109-132)/(57-70) 114/58 mmHg (07/11 0441) SpO2:  [93 %-95 %] 93 % (07/11 0441)  Intake/Output from previous day: 07/10 0701 - 07/11 0700 In: 960 [P.O.:960] Out: 1550 [Urine:1550]  Labs: No results found for this basename: WBC, RBC, HCT, PLT,  in the last 72 hours No results found for this basename: NA, K, CL, CO2, BUN, CREATININE, GLUCOSE, CALCIUM,  in the last 72 hours No results found for this basename: LABPT, INR,  in the last 72 hours  Physical Exam: Neurologically intact ABD soft Intact pulses distally Incision: dressing C/D/I and no drainage Compartment soft  Assessment/Plan: Patient stable  xrays satisfactory Continue mobilization with physical therapy Continue care  Advance diet Up with therapy Discharge home with home health Doing well - ok for d/c to home if cleared by PT  Venita Lick, MD [facility] [phone]

## 2013-07-09 NOTE — Progress Notes (Signed)
Occupational Therapy Treatment Patient Details Name: Miguel Brock MRN: [id number] DOB: 1946/07/14 Today's Date: 07/09/2013 Time: 6962-9528 OT Time Calculation (min): 23 min  OT Assessment / Plan / Recommendation  OT comments  Pt currently modified independent for all toileting tasks and tub transfers.  He is able to state and follow his back precautions with use of DME and AE as needed.  No further OT needs.  Follow Up Recommendations  No OT follow up       Equipment Recommendations  None recommended by OT          Progress towards OT Goals Progress towards OT goals: Goals met/education completed, patient discharged from OT  Plan All goals met and education completed, patient discharged from OT services    Precautions / Restrictions Precautions Precautions: Back Precaution Comments: reviewed back precautions and log rolling technique; pt. able to state 2/3 precautions; reinforced knowledge of precautions and encouraged adherence Required Braces or Orthoses: Spinal Brace Spinal Brace: Lumbar corset;Applied in sitting position Restrictions Weight Bearing Restrictions: No   Pertinent Vitals/Pain Slight pain in anterior stomach from incision    ADL  Upper Body Dressing: Performed;Modified independent Where Assessed - Upper Body Dressing: Unsupported sitting Lower Body Dressing: Performed;Modified independent Where Assessed - Lower Body Dressing: Unsupported sit to stand Toilet Transfer: Simulated;Modified independent Toilet Transfer Method: Other (comment) (ambulate with RW) Toilet Transfer Equipment: Bedside commode Toileting - Clothing Manipulation and Hygiene: Simulated;Modified independent Where Assessed - Toileting Clothing Manipulation and Hygiene: Sit to stand from 3-in-1 or toilet Tub/Shower Transfer: Performed;Modified independent Tub/Shower Transfer Method: Ambulating Transfers/Ambulation Related to ADLs: Pt is currently modified independent for mobility using the  RW. ADL Comments: Pt overall modified independent for all selfcare tasks using DME.  He is able to cross his LEs for dressing and reports having a reacher to use for retrieving objects from the floor and lower levels.  No further OT needs or DME at this time.         OT Goals(current goals can now be found in the care plan section) Acute Rehab OT Goals Patient Stated Goal: to be as independent as possible and return to riding motorcycles  Visit Information  Last OT Received On: 07/09/13 Assistance Needed: +1 History of Present Illness: Pt. underwent ALIF at  L5-S1 for lumbar DDD, discogenic pain           Cognition  Cognition Arousal/Alertness: Awake/alert Behavior During Therapy: WFL for tasks assessed/performed Overall Cognitive Status: Within Functional Limits for tasks assessed    Mobility  Bed Mobility Bed Mobility: Rolling Right;Right Sidelying to Sit Rolling Right: 6: Modified independent (Device/Increase time) Right Sidelying to Sit: 6: Modified independent (Device/Increase time);HOB flat Sitting - Scoot to Edge of Bed: 6: Modified independent (Device/Increase time) Sit to Supine: 6: Modified independent (Device/Increase time) Details for Bed Mobility Assistance: cues to maintain straight spine in rolling Transfers Transfers: Sit to Stand;Stand to Sit Sit to Stand: 6: Modified independent (Device/Increase time);From bed;With upper extremity assist Stand to Sit: 6: Modified independent (Device/Increase time);To bed;With upper extremity assist Details for Transfer Assistance: cues for keeping spine as straight as possible       Balance Balance Balance Assessed: Yes Dynamic Standing Balance Dynamic Standing - Balance Support: Right upper extremity supported;Left upper extremity supported Dynamic Standing - Level of Assistance: 6: Modified independent (Device/Increase time)   End of Session OT - End of Session Equipment Utilized During Treatment: Back brace;Rolling  walker Activity Tolerance: Patient tolerated treatment well Patient left: in bed;with  call bell/phone within reach     [facility] OTR/L Pager number [phone] 07/09/2013, 9:31 AM

## 2013-07-20 DIAGNOSIS — Z981 Arthrodesis status: Secondary | ICD-10-CM | POA: Diagnosis not present

## 2013-07-24 ENCOUNTER — Encounter (HOSPITAL_COMMUNITY): Payer: Self-pay | Admitting: *Deleted

## 2013-07-24 ENCOUNTER — Emergency Department (HOSPITAL_COMMUNITY)
Admission: EM | Admit: 2013-07-24 | Discharge: 2013-07-24 | Disposition: A | Discharge status: Home / Self Care | Payer: Medicare Other | Attending: Emergency Medicine | Admitting: Emergency Medicine

## 2013-07-24 DIAGNOSIS — Z79899 Other long term (current) drug therapy: Secondary | ICD-10-CM | POA: Insufficient documentation

## 2013-07-24 DIAGNOSIS — L500 Allergic urticaria: Secondary | ICD-10-CM | POA: Diagnosis not present

## 2013-07-24 DIAGNOSIS — R0602 Shortness of breath: Secondary | ICD-10-CM | POA: Diagnosis not present

## 2013-07-24 DIAGNOSIS — Z9861 Coronary angioplasty status: Secondary | ICD-10-CM | POA: Insufficient documentation

## 2013-07-24 DIAGNOSIS — T7840XA Allergy, unspecified, initial encounter: Secondary | ICD-10-CM | POA: Diagnosis not present

## 2013-07-24 DIAGNOSIS — I10 Essential (primary) hypertension: Secondary | ICD-10-CM | POA: Diagnosis not present

## 2013-07-24 DIAGNOSIS — T783XXA Angioneurotic edema, initial encounter: Secondary | ICD-10-CM

## 2013-07-24 DIAGNOSIS — R062 Wheezing: Secondary | ICD-10-CM | POA: Diagnosis not present

## 2013-07-24 DIAGNOSIS — I2510 Atherosclerotic heart disease of native coronary artery without angina pectoris: Secondary | ICD-10-CM | POA: Diagnosis not present

## 2013-07-24 DIAGNOSIS — L509 Urticaria, unspecified: Secondary | ICD-10-CM | POA: Insufficient documentation

## 2013-07-24 DIAGNOSIS — Z87891 Personal history of nicotine dependence: Secondary | ICD-10-CM | POA: Diagnosis not present

## 2013-07-24 DIAGNOSIS — K219 Gastro-esophageal reflux disease without esophagitis: Secondary | ICD-10-CM | POA: Diagnosis not present

## 2013-07-24 DIAGNOSIS — T782XXA Anaphylactic shock, unspecified, initial encounter: Secondary | ICD-10-CM | POA: Diagnosis not present

## 2013-07-24 DIAGNOSIS — E119 Type 2 diabetes mellitus without complications: Secondary | ICD-10-CM | POA: Diagnosis not present

## 2013-07-24 MED ORDER — EPINEPHRINE 0.3 MG/0.3ML IJ SOAJ: 0.3000 mg | Freq: Once | INTRAMUSCULAR | Status: DC

## 2013-07-24 MED ORDER — DIPHENHYDRAMINE HCL 25 MG PO TABS: 25.0000 mg | ORAL_TABLET | Freq: Four times a day (QID) | ORAL | Status: DC

## 2013-07-24 MED ORDER — FAMOTIDINE 20 MG PO TABS: 20.0000 mg | ORAL_TABLET | Freq: Two times a day (BID) | ORAL | Status: DC

## 2013-07-24 MED ORDER — PREDNISONE 20 MG PO TABS: 60.0000 mg | ORAL_TABLET | Freq: Every day | ORAL | Status: DC

## 2013-07-24 NOTE — ED Notes (Signed)
GEX:BM84<XL> Expected date:<BR> Expected time:<BR> Means of arrival:<BR> Comments:<BR> EMS/67 yo male with allergic reaction-treated with benadryl, solumedrol and epi

## 2013-07-24 NOTE — ED Notes (Signed)
Pt still has some oral swelling under tongue,  Welts are gone,  Pt is advised we will continue to evaluate until 5 am to make sure there is no rebound reaction and if none he will be released home

## 2013-07-24 NOTE — ED Notes (Signed)
Called Mrs.Diprima for pickup [phone], advised pt will wait in room until her arrival,  Pt also made aware

## 2013-07-24 NOTE — ED Notes (Signed)
Pt. Placed on 2 liters 02 per RN,Anna. Pt. Oxygen level 90%.

## 2013-07-24 NOTE — ED Notes (Signed)
Pt was at home and started having facial swelling, hives,  Scratchy throat,  Obvious swelling of tongue, and SOB

## 2013-07-24 NOTE — ED Notes (Signed)
Pt placed on oxygen 2L  [place] due to oxygen sats dropping in high 80's while sleeping

## 2013-07-24 NOTE — ED Notes (Signed)
Pt went immediately to restroom upon arrival, stated his stomach was upset

## 2013-07-24 NOTE — ED Provider Notes (Signed)
CSN: [id number]     Arrival date & time 07/24/13  0103 History     First MD Initiated Contact with Patient 07/24/13 0120     Chief Complaint  Patient presents with  . Allergic Reaction   (Consider location/radiation/quality/duration/timing/severity/associated sxs/prior Treatment) HPI 67 yo male presents to the ER from home via EMS with allergic reaction.  Pt reports he took his nightly meds after having dinner of spaghetti.  Ambien is only new med, has taken 4 tabs over a week span.  Soon after going to bed he developed hives to his lower posterior legs and swelling of his tongue.  He took benadryl.  EMS gave epi, solumedrol, zantac, and another dose of benadryl.  Pt reports hives have completely resolved, and his tongue swelling has much improved.  No ACE inhibitor, but is on ARB.  Past Medical History  Diagnosis Date  . Anginal pain     occ  . Coronary artery disease 10    stents x5  . Hypertension   . Pneumonia     hx  . Diabetes mellitus without complication   . GERD (gastroesophageal reflux disease)     zantac  . H/O hiatal hernia   . Hepatitis 70's    food    Past Surgical History  Procedure Laterality Date  . Coronary artery bypass graft  10  . Elbow arthroscopy Right 89    ulner nerve rel  . Shoulder arthroscopy Left     release of tendon  . Thrumb Right   . Hernia repair Right 64  . Anterior fusion lumbar spine      L5 S1    07/07/2013      Dr Shon Baton  . Anterior lumbar fusion N/A 07/07/2013    Procedure: ANTERIOR LUMBAR FUSION 1 LEVEL ALIF L5-S1;  Surgeon: Venita Lick, MD;  Location: [facility] OR;  Service: Orthopedics;  Laterality: N/A;  . Abdominal exposure N/A 07/07/2013    Procedure: ABDOMINAL EXPOSURE FOR ANTERIOR LUMBAR FUSION;  Surgeon: Larina Earthly, MD;  Location: [facility] OR;  Service: Vascular;  Laterality: N/A;   No family history on file. History  Substance Use Topics  . Smoking status: Former Smoker -- 1.50 packs/day for 23 years    Types: Cigarettes    Quit  date: 07/06/1983  . Smokeless tobacco: Never Used  . Alcohol Use: 12.6 oz/week    21 Shots of liquor per week    Review of Systems  All other systems reviewed and are negative.    Allergies  Review of patient's allergies indicates no known allergies.  Home Medications   Current Outpatient Rx  Name  Route  Sig  Dispense  Refill  . atorvastatin (LIPITOR) 80 MG tablet   Oral   Take 80 mg by mouth every morning.          . hydrochlorothiazide (HYDRODIURIL) 25 MG tablet   Oral   Take 25 mg by mouth every morning.          Marland Kitchen losartan (COZAAR) 100 MG tablet   Oral   Take 100 mg by mouth every morning.          . metFORMIN (GLUCOPHAGE) 500 MG tablet   Oral   Take 500 mg by mouth 2 (two) times daily with a meal.         . methocarbamol (ROBAXIN) 500 MG tablet   Oral   Take 500 mg by mouth 3 (three) times daily as needed (pain).         Marland Kitchen  oxyCODONE-acetaminophen (PERCOCET) 10-325 MG per tablet   Oral   Take 1 tablet by mouth every 4 (four) hours as needed for pain.   60 tablet   0   . polyethylene glycol powder (GLYCOLAX) powder   Oral   Take 17 g by mouth daily.   255 g   1   . ranitidine (ZANTAC) 150 MG tablet   Oral   Take 150 mg by mouth as needed for heartburn.         . docusate sodium (COLACE) 100 MG capsule   Oral   Take 1 capsule (100 mg total) by mouth 3 (three) times daily as needed for constipation.   30 capsule   0   . enoxaparin (LOVENOX) 40 MG/0.4ML injection   Subcutaneous   Inject 0.4 mLs (40 mg total) into the skin daily.   10 Syringe   0   . metoprolol succinate (TOPROL-XL) 100 MG 24 hr tablet   Oral   Take 100 mg by mouth daily. Take with or immediately following a meal.          BP 163/73  Pulse 91  Temp(Src) 97.8 F (36.6 C) (Oral)  Resp 18  SpO2 97% Physical Exam  Nursing note and vitals reviewed. Constitutional: He is oriented to person, place, and time. He appears well-developed and well-nourished.  HENT:   Head: Normocephalic and atraumatic.  Nose: Nose normal.  Mouth/Throat: Oropharynx is clear and moist.  Submental angioedema, no difficulties swallowing  Eyes: Conjunctivae and EOM are normal. Pupils are equal, round, and reactive to light.  Neck: Normal range of motion. Neck supple. No JVD present. No tracheal deviation present. No thyromegaly present.  Cardiovascular: Normal rate, regular rhythm, normal heart sounds and intact distal pulses.  Exam reveals no gallop and no friction rub.   No murmur heard. Pulmonary/Chest: Effort normal and breath sounds normal. No stridor. No respiratory distress. He has no wheezes. He has no rales. He exhibits no tenderness.  Abdominal: Soft. Bowel sounds are normal. He exhibits no distension and no mass. There is no tenderness. There is no rebound and no guarding.  Musculoskeletal: Normal range of motion. He exhibits no edema and no tenderness.  Lymphadenopathy:    He has no cervical adenopathy.  Neurological: He is alert and oriented to person, place, and time. He exhibits normal muscle tone. Coordination normal.  Skin: Skin is warm and dry. No rash noted. No erythema. No pallor.  Psychiatric: He has a normal mood and affect. His behavior is normal. Judgment and thought content normal.    ED Course   Procedures (including critical care time)  Labs Reviewed - No data to display No results found. No diagnosis found.  MDM  67 yo male with allergic reaction:hives and angioedema.  Sxs improving after treatment.  Will observe for 4 hours post epi administration.  Plan to d/c if stable and close f/u with allergist.  5:48 AM Pt with some residual submental edema, but no difficulties speaking, swallowing, breathing.  Olivia Mackie, MD 07/24/13 [phone]

## 2013-08-23 DIAGNOSIS — Z981 Arthrodesis status: Secondary | ICD-10-CM | POA: Diagnosis not present

## 2013-09-24 DIAGNOSIS — Z981 Arthrodesis status: Secondary | ICD-10-CM | POA: Diagnosis not present

## 2013-09-29 DIAGNOSIS — B1710 Acute hepatitis C without hepatic coma: Secondary | ICD-10-CM | POA: Diagnosis not present

## 2013-09-29 DIAGNOSIS — Z23 Encounter for immunization: Secondary | ICD-10-CM | POA: Diagnosis not present

## 2013-09-29 DIAGNOSIS — K644 Residual hemorrhoidal skin tags: Secondary | ICD-10-CM | POA: Diagnosis not present

## 2013-09-29 DIAGNOSIS — E785 Hyperlipidemia, unspecified: Secondary | ICD-10-CM | POA: Diagnosis not present

## 2013-09-29 DIAGNOSIS — I2510 Atherosclerotic heart disease of native coronary artery without angina pectoris: Secondary | ICD-10-CM | POA: Diagnosis not present

## 2013-09-29 DIAGNOSIS — I10 Essential (primary) hypertension: Secondary | ICD-10-CM | POA: Diagnosis not present

## 2013-09-29 DIAGNOSIS — T7840XA Allergy, unspecified, initial encounter: Secondary | ICD-10-CM | POA: Diagnosis not present

## 2013-09-29 DIAGNOSIS — M48061 Spinal stenosis, lumbar region without neurogenic claudication: Secondary | ICD-10-CM | POA: Diagnosis not present

## 2013-09-29 DIAGNOSIS — E1169 Type 2 diabetes mellitus with other specified complication: Secondary | ICD-10-CM | POA: Diagnosis not present

## 2013-09-30 DIAGNOSIS — E119 Type 2 diabetes mellitus without complications: Secondary | ICD-10-CM | POA: Diagnosis not present

## 2013-09-30 DIAGNOSIS — E785 Hyperlipidemia, unspecified: Secondary | ICD-10-CM | POA: Diagnosis not present

## 2013-09-30 DIAGNOSIS — B1710 Acute hepatitis C without hepatic coma: Secondary | ICD-10-CM | POA: Diagnosis not present

## 2013-10-15 DIAGNOSIS — J3081 Allergic rhinitis due to animal (cat) (dog) hair and dander: Secondary | ICD-10-CM | POA: Diagnosis not present

## 2013-10-15 DIAGNOSIS — K219 Gastro-esophageal reflux disease without esophagitis: Secondary | ICD-10-CM | POA: Diagnosis not present

## 2013-10-15 DIAGNOSIS — R21 Rash and other nonspecific skin eruption: Secondary | ICD-10-CM | POA: Diagnosis not present

## 2013-10-15 DIAGNOSIS — J3089 Other allergic rhinitis: Secondary | ICD-10-CM | POA: Diagnosis not present

## 2013-10-18 DIAGNOSIS — R21 Rash and other nonspecific skin eruption: Secondary | ICD-10-CM | POA: Diagnosis not present

## 2013-11-04 ENCOUNTER — Other Ambulatory Visit: Payer: Self-pay

## 2013-11-09 DIAGNOSIS — M545 Low back pain, unspecified: Secondary | ICD-10-CM | POA: Diagnosis not present

## 2013-11-09 DIAGNOSIS — Z981 Arthrodesis status: Secondary | ICD-10-CM | POA: Diagnosis not present

## 2013-12-10 DIAGNOSIS — M76899 Other specified enthesopathies of unspecified lower limb, excluding foot: Secondary | ICD-10-CM | POA: Diagnosis not present

## 2013-12-27 DIAGNOSIS — I10 Essential (primary) hypertension: Secondary | ICD-10-CM | POA: Diagnosis not present

## 2013-12-27 DIAGNOSIS — R0602 Shortness of breath: Secondary | ICD-10-CM | POA: Diagnosis not present

## 2013-12-27 DIAGNOSIS — E7800 Pure hypercholesterolemia, unspecified: Secondary | ICD-10-CM | POA: Diagnosis not present

## 2013-12-27 DIAGNOSIS — I2510 Atherosclerotic heart disease of native coronary artery without angina pectoris: Secondary | ICD-10-CM | POA: Diagnosis not present

## 2014-01-07 DIAGNOSIS — I10 Essential (primary) hypertension: Secondary | ICD-10-CM | POA: Diagnosis not present

## 2014-01-07 DIAGNOSIS — I2510 Atherosclerotic heart disease of native coronary artery without angina pectoris: Secondary | ICD-10-CM | POA: Diagnosis not present

## 2014-01-07 DIAGNOSIS — R0602 Shortness of breath: Secondary | ICD-10-CM | POA: Diagnosis not present

## 2014-01-10 ENCOUNTER — Encounter (HOSPITAL_COMMUNITY): Payer: Self-pay | Admitting: Respiratory Therapy

## 2014-01-10 DIAGNOSIS — R0989 Other specified symptoms and signs involving the circulatory and respiratory systems: Secondary | ICD-10-CM | POA: Diagnosis not present

## 2014-01-11 ENCOUNTER — Ambulatory Visit (HOSPITAL_COMMUNITY)
Admission: RE | Admit: 2014-01-11 | Discharge: 2014-01-12 | Disposition: A | Discharge status: Home / Self Care | Payer: Medicare Other | Source: Ambulatory Visit | Attending: Cardiology | Admitting: Cardiology

## 2014-01-11 ENCOUNTER — Encounter (HOSPITAL_COMMUNITY)
Admission: RE | Disposition: A | Discharge status: Home / Self Care | Payer: Medicare Other | Source: Ambulatory Visit | Attending: Cardiology

## 2014-01-11 ENCOUNTER — Encounter (HOSPITAL_COMMUNITY): Payer: Self-pay | Admitting: General Practice

## 2014-01-11 DIAGNOSIS — E119 Type 2 diabetes mellitus without complications: Secondary | ICD-10-CM | POA: Insufficient documentation

## 2014-01-11 DIAGNOSIS — Z87891 Personal history of nicotine dependence: Secondary | ICD-10-CM | POA: Diagnosis not present

## 2014-01-11 DIAGNOSIS — E7800 Pure hypercholesterolemia, unspecified: Secondary | ICD-10-CM | POA: Insufficient documentation

## 2014-01-11 DIAGNOSIS — I2510 Atherosclerotic heart disease of native coronary artery without angina pectoris: Secondary | ICD-10-CM | POA: Insufficient documentation

## 2014-01-11 DIAGNOSIS — E785 Hyperlipidemia, unspecified: Secondary | ICD-10-CM | POA: Diagnosis not present

## 2014-01-11 DIAGNOSIS — R9439 Abnormal result of other cardiovascular function study: Secondary | ICD-10-CM | POA: Diagnosis not present

## 2014-01-11 DIAGNOSIS — Z8249 Family history of ischemic heart disease and other diseases of the circulatory system: Secondary | ICD-10-CM | POA: Diagnosis not present

## 2014-01-11 DIAGNOSIS — I10 Essential (primary) hypertension: Secondary | ICD-10-CM | POA: Diagnosis not present

## 2014-01-11 DIAGNOSIS — Z9861 Coronary angioplasty status: Secondary | ICD-10-CM | POA: Insufficient documentation

## 2014-01-11 DIAGNOSIS — R079 Chest pain, unspecified: Secondary | ICD-10-CM | POA: Diagnosis not present

## 2014-01-11 DIAGNOSIS — I209 Angina pectoris, unspecified: Secondary | ICD-10-CM | POA: Diagnosis not present

## 2014-01-11 DIAGNOSIS — Z7982 Long term (current) use of aspirin: Secondary | ICD-10-CM | POA: Insufficient documentation

## 2014-01-11 HISTORY — DX: Other bursitis of hip, left hip: M70.72

## 2014-01-11 HISTORY — PX: CORONARY ANGIOPLASTY: SHX604

## 2014-01-11 HISTORY — PX: PERCUTANEOUS CORONARY STENT INTERVENTION (PCI-S): SHX5485

## 2014-01-11 HISTORY — PX: LEFT HEART CATHETERIZATION WITH CORONARY ANGIOGRAM: SHX5451

## 2014-01-11 LAB — POCT ACTIVATED CLOTTING TIME
ACTIVATED CLOTTING TIME: 520 s
Activated Clotting Time: 199 seconds

## 2014-01-11 LAB — CBC
HCT: 38.1 % — ABNORMAL LOW (ref 39.0–52.0)
Hemoglobin: 13.4 g/dL (ref 13.0–17.0)
MCH: 33.8 pg (ref 26.0–34.0)
MCHC: 35.2 g/dL (ref 30.0–36.0)
MCV: 96.2 fL (ref 78.0–100.0)
Platelets: 294 10*3/uL (ref 150–400)
RBC: 3.96 MIL/uL — ABNORMAL LOW (ref 4.22–5.81)
RDW: 13.6 % (ref 11.5–15.5)
WBC: 6.8 10*3/uL (ref 4.0–10.5)

## 2014-01-11 LAB — BASIC METABOLIC PANEL
BUN: 27 mg/dL — ABNORMAL HIGH (ref 6–23)
CO2: 24 mEq/L (ref 19–32)
CREATININE: 1.19 mg/dL (ref 0.50–1.35)
Calcium: 8.8 mg/dL (ref 8.4–10.5)
Chloride: 103 mEq/L (ref 96–112)
GFR calc Af Amer: 71 mL/min — ABNORMAL LOW (ref >90)
GFR, EST NON AFRICAN AMERICAN: 61 mL/min — AB (ref >90)
GLUCOSE: 141 mg/dL — AB (ref 70–99)
Potassium: 5.3 mEq/L (ref 3.7–5.3)
SODIUM: 140 meq/L (ref 137–147)

## 2014-01-11 LAB — GLUCOSE, CAPILLARY
GLUCOSE-CAPILLARY: 154 mg/dL — AB (ref 70–99)
GLUCOSE-CAPILLARY: 169 mg/dL — AB (ref 70–99)
Glucose-Capillary: 139 mg/dL — ABNORMAL HIGH (ref 70–99)
Glucose-Capillary: 110 mg/dL — ABNORMAL HIGH (ref 70–99)

## 2014-01-11 LAB — PROTIME-INR
INR: 0.87 (ref 0.00–1.49)
Prothrombin Time: 11.7 seconds (ref 11.6–15.2)

## 2014-01-11 SURGERY — LEFT HEART CATHETERIZATION WITH CORONARY ANGIOGRAM
Anesthesia: LOCAL

## 2014-01-11 MED ORDER — FAMOTIDINE 20 MG PO TABS
20.0000 mg | ORAL_TABLET | Freq: Two times a day (BID) | ORAL | Status: DC
  Administered 2014-01-11: 20 mg via ORAL
  Filled 2014-01-11 (×3): qty 1

## 2014-01-11 MED ORDER — METFORMIN HCL 500 MG PO TABS: 500.0000 mg | ORAL_TABLET | Freq: Two times a day (BID) | ORAL | Status: DC

## 2014-01-11 MED ORDER — ONDANSETRON HCL 4 MG/2ML IJ SOLN: 4.0000 mg | Freq: Four times a day (QID) | INTRAMUSCULAR | Status: DC | PRN

## 2014-01-11 MED ORDER — HYDROCHLOROTHIAZIDE 25 MG PO TABS
25.0000 mg | ORAL_TABLET | Freq: Every morning | ORAL | Status: DC
  Filled 2014-01-11: qty 1

## 2014-01-11 MED ORDER — LIDOCAINE HCL (PF) 1 % IJ SOLN
INTRAMUSCULAR | Status: AC
  Filled 2014-01-11: qty 30

## 2014-01-11 MED ORDER — VERAPAMIL HCL 2.5 MG/ML IV SOLN
INTRAVENOUS | Status: AC
  Filled 2014-01-11: qty 2

## 2014-01-11 MED ORDER — EPINEPHRINE 0.3 MG/0.3ML IJ SOAJ: 0.3000 mg | Freq: Once | INTRAMUSCULAR | Status: DC

## 2014-01-11 MED ORDER — SODIUM CHLORIDE 0.9 % IV BOLUS (SEPSIS)
500.0000 mL | Freq: Once | INTRAVENOUS | Status: AC
  Administered 2014-01-11: 500 mL via INTRAVENOUS

## 2014-01-11 MED ORDER — HEPARIN SODIUM (PORCINE) 1000 UNIT/ML IJ SOLN
INTRAMUSCULAR | Status: AC
  Filled 2014-01-11: qty 1

## 2014-01-11 MED ORDER — METOPROLOL SUCCINATE ER 100 MG PO TB24
100.0000 mg | ORAL_TABLET | Freq: Every day | ORAL | Status: DC
  Administered 2014-01-11 – 2014-01-12 (×2): 100 mg via ORAL
  Filled 2014-01-11 (×3): qty 1

## 2014-01-11 MED ORDER — PRASUGREL HCL 10 MG PO TABS
ORAL_TABLET | ORAL | Status: AC
  Filled 2014-01-11: qty 6

## 2014-01-11 MED ORDER — MIDAZOLAM HCL 2 MG/2ML IJ SOLN
INTRAMUSCULAR | Status: AC
  Filled 2014-01-11: qty 2

## 2014-01-11 MED ORDER — HEPARIN (PORCINE) IN NACL 2-0.9 UNIT/ML-% IJ SOLN
INTRAMUSCULAR | Status: AC
  Filled 2014-01-11: qty 1000

## 2014-01-11 MED ORDER — ASPIRIN 81 MG PO CHEW
81.0000 mg | CHEWABLE_TABLET | Freq: Every day | ORAL | Status: DC
  Administered 2014-01-12: 11:00:00 81 mg via ORAL
  Filled 2014-01-11: qty 1

## 2014-01-11 MED ORDER — SODIUM CHLORIDE 0.9 % IJ SOLN: 3.0000 mL | Freq: Two times a day (BID) | INTRAMUSCULAR | Status: DC

## 2014-01-11 MED ORDER — ASPIRIN 81 MG PO CHEW
81.0000 mg | CHEWABLE_TABLET | ORAL | Status: AC
  Administered 2014-01-11: 81 mg via ORAL
  Filled 2014-01-11: qty 1

## 2014-01-11 MED ORDER — DIPHENHYDRAMINE HCL 25 MG PO TABS: 25.0000 mg | ORAL_TABLET | Freq: Four times a day (QID) | ORAL | Status: DC

## 2014-01-11 MED ORDER — ATORVASTATIN CALCIUM 80 MG PO TABS
80.0000 mg | ORAL_TABLET | Freq: Every day | ORAL | Status: DC
  Administered 2014-01-11 – 2014-01-12 (×2): 80 mg via ORAL
  Filled 2014-01-11 (×3): qty 1

## 2014-01-11 MED ORDER — NITROGLYCERIN 0.4 MG SL SUBL: 0.4000 mg | SUBLINGUAL_TABLET | SUBLINGUAL | Status: DC | PRN

## 2014-01-11 MED ORDER — SODIUM CHLORIDE 0.9 % IJ SOLN: 3.0000 mL | INTRAMUSCULAR | Status: DC | PRN

## 2014-01-11 MED ORDER — ASPIRIN 81 MG PO CHEW: 81.0000 mg | CHEWABLE_TABLET | ORAL | Status: DC

## 2014-01-11 MED ORDER — SODIUM CHLORIDE 0.9 % IV SOLN
INTRAVENOUS | Status: DC
  Administered 2014-01-11: 10:00:00 via INTRAVENOUS

## 2014-01-11 MED ORDER — SODIUM CHLORIDE 0.9 % IV SOLN: 250.0000 mL | INTRAVENOUS | Status: DC | PRN

## 2014-01-11 MED ORDER — ACETAMINOPHEN 325 MG PO TABS: 650.0000 mg | ORAL_TABLET | ORAL | Status: DC | PRN

## 2014-01-11 MED ORDER — METHOCARBAMOL 500 MG PO TABS: 500.0000 mg | ORAL_TABLET | Freq: Three times a day (TID) | ORAL | Status: DC | PRN

## 2014-01-11 MED ORDER — HYDROCHLOROTHIAZIDE 25 MG PO TABS
25.0000 mg | ORAL_TABLET | Freq: Every morning | ORAL | Status: DC
  Administered 2014-01-12: 11:00:00 25 mg via ORAL
  Filled 2014-01-11: qty 1

## 2014-01-11 MED ORDER — LOSARTAN POTASSIUM 50 MG PO TABS
100.0000 mg | ORAL_TABLET | Freq: Every morning | ORAL | Status: DC
  Administered 2014-01-12: 11:00:00 100 mg via ORAL
  Filled 2014-01-11: qty 2

## 2014-01-11 MED ORDER — SODIUM CHLORIDE 0.9 % IV SOLN: 1.0000 mL/kg/h | INTRAVENOUS | Status: AC

## 2014-01-11 MED ORDER — SODIUM CHLORIDE 0.9 % IJ SOLN
3.0000 mL | Freq: Two times a day (BID) | INTRAMUSCULAR | Status: DC
  Administered 2014-01-11: 3 mL via INTRAVENOUS

## 2014-01-11 MED ORDER — NITROGLYCERIN 0.2 MG/ML ON CALL CATH LAB
INTRAVENOUS | Status: AC
  Filled 2014-01-11: qty 1

## 2014-01-11 MED ORDER — SODIUM CHLORIDE 0.9 % IV SOLN: INTRAVENOUS | Status: DC

## 2014-01-11 MED ORDER — HYDROMORPHONE HCL PF 2 MG/ML IJ SOLN
INTRAMUSCULAR | Status: AC
  Filled 2014-01-11: qty 1

## 2014-01-11 MED ORDER — BIVALIRUDIN 250 MG IV SOLR
INTRAVENOUS | Status: AC
  Filled 2014-01-11: qty 250

## 2014-01-11 MED ORDER — PRASUGREL HCL 10 MG PO TABS
10.0000 mg | ORAL_TABLET | Freq: Every day | ORAL | Status: DC
  Administered 2014-01-12: 10 mg via ORAL
  Filled 2014-01-11 (×2): qty 1

## 2014-01-11 NOTE — Interval H&P Note (Signed)
History and Physical Interval Note:  01/11/2014 10:34 AM  Miguel Brock  has presented today for surgery, with the diagnosis of ANGINA  The various methods of treatment have been discussed with the patient and family. After consideration of risks, benefits and other options for treatment, the patient has consented to  Procedure(s): LEFT HEART CATHETERIZATION WITH CORONARY ANGIOGRAM (N/A) and possible PCI as a surgical intervention .  The patient's history has been reviewed, patient examined, no change in status, stable for surgery.  I have reviewed the patient's chart and labs.  Questions were answered to the patient's satisfaction.   Cath Lab Visit (complete for each Cath Lab visit)  Clinical Evaluation Leading to the Procedure:   ACS: no  Non-ACS:    Anginal Classification: CCS II  Anti-ischemic medical therapy: Maximal Therapy (2 or more classes of medications)  Non-Invasive Test Results: Intermediate-risk stress test findings: cardiac mortality 1-3%/year  Prior CABG: No previous CABG        [facility]

## 2014-01-11 NOTE — H&P (Signed)
MY OV notes:  Miguel Brock January 12, 2014 3:50 PM Location: [facility] Patient #: [id number] DOB: [birth year] Married / Language: English / Race: White Male    History of Present Illness(Miguel Carlynn Herald, MD; Jan 12, 2014 4:51 PM) Patient words: f/u visit; pt c/o dizziness, sob, light headed  The patient is a 68 year old male who presents for a recheck of CAD. [name] is a 68 year old Caucasian male with history of known coronary artery disease presents here for followup of CAD. Patient has undergone staged intervention to mid LAD and circumflex coronary artery on 05/28/2011 and to mid RCA on 06/10/2011 with implantation of drug-eluting stents to the left system and nondrug eluding stent to the right coronary artery.  For the past 2 weeks to 3 weeks, he has noticed worsening shortness of breath and dyspnea on exertion along with mild chest discomfort at night especially. He has not used any sublingual nitroglycerin. Prior to the episode of this shortness of breath, patient states that he had gastroenteritis which is completely resolved, however his symptoms of dyspnea continued to persist.  He is worried about recurrence of coronary artery disease, and presents here to be evaluated. He has noticed mild orthopnea,, but no leg edema. No palpitations, no chest pain, has not used any sublingual nitroglycerin recently. No significant weight change recently. Diabetes has been well-controlled.     Problem List/Past Medical(Christie Beane; Jan 12, 2014 3:51 PM) Pure hypercholesterolemia (272.0). Labs 06/04/2013: TSH normal, total cholesterol 264, triglycerides 233, HDL 51, LDL 166. LDL particle #2070, markedly elevated. LP-IR score 64 suggest insulin resistance. Vitamin D is normal. Free T4 mildly reduced at 4.2 (4.5-4.0). Patient needs change in therapy, we discussed in office visit.  Labs done on 06/26/2012 reveals total cholesterol 232, triglycerides  559, HDL 33, LDL could not be calculated. Non-HDL cholesterol was 199. Postsurgical percutaneous transluminal coronary angioplasty status (V45.82). Staged intervention: 05/28/11 Proximal and Mid LAD stent 3x22 and 3x38 Resolute and Xience Prime DES. Proximal Circumflex 3x22 Resolute stent.  06/10/11 Stenting of mid RCA with 4x18 Integrity stent (non-DES) and scoring balloon angioplasty of PDA with 2.0x10 Balloon. Controlled diabetes mellitus type II without complication (240.97) Essential hypertension, benign (401.1) Coronary atherosclerosis of native coronary artery (414.01). EKG 06/18/2013 S. Bradycardia @ 52/min. Normal intervals. No ischemia. Normal EKG. No change from EKG 02/03/2013.  Exercise sestamibi 07/24/12: EKG positive for ischemia. 9 min, 12.5 METs. No chest pain. Diaphamatic attenuation without ischemia.   Heart Cath: 05/21/11 Proximal RCA 90%, Large PDA with bifurcationg 80% stenosis, PL normal, LAD severe proximal and mid diffuse disease and distal tandem 90% stenosis and Proximal Circumflex 80% stenosis. Normal LVEF.  Staged intervention: 05/28/11 Proximal and Mid LAD stent 3x22 and 3x38 Resolute and Xience Prime DES. Proximal Circumflex 3x22 Resolute stent. 06/10/11 Stenting of mid RCA with 4x18 Integrity stent (non-DES) and scoring balloon angioplasty of PDA with 2.0x10 Balloon.    Allergies(Christie Beane; 2014/01/12 3:51 PM) Lisinopril *ANTIHYPERTENSIVES*. Cough.    Family History(Christie Beane; 01-12-2014 3:51 PM) Father died at age 35 from heart disease. Mother is 45 yrs. old-healthy. 1 brother (age 37) h/o stents. Coronary Artery Disease. Father, Brother. Father died at age 37 from heart disease. Mother is 37 yrs. old-healthy. 1 brother (age 58) h/o stents.    Social History(Christie Beane; 2014/01/12 3:51 PM) Marital status. Married. Current tobacco use. Former smoker. quit in 1988 Alcohol Use. Moderate alcohol use. 2-3 shots of liquor several times a  week. Number of Children. 3.    Past Surgical History(Christie  Beane; 12/27/2013 3:51 PM) Left rotator cuff surgery in 1991 Right elbow surgery in 1999 06/10/11 Stenting of mid RCA with 4x18 Integrity stent (non-DES) and scoring balloon angioplasty of PDA with 2.0x10 Balloon. Surgery on right hand 5/11 05/28/11 Proximal and Mid LAD stent 3x22 and 3x38 Resolute and Xience Prime DES. Proximal Circumflex 3x22 Resolute stent. Eyelid surgery in 2010 Heart Cath: 05/21/11 Proximal RCA 90%, Large PDA with bifurcationg 80% stenosis, PL normal, LAD severe proximal and mid diffuse disease and distal tandem 90% stenosis and Proximal Circumflex 80% stenosis. Normal LVEF.    Medication History(Christie Beane; 12/27/2013 3:52 PM) Aspirin (325MG Tablet, 1 Oral daily, Taken starting 02/03/2013) Active. Vitamin D (2000UNIT Capsule, 1 (one) Capsule Oral once a week, Taken starting 03/18/2013) Active. Metoprolol Succinate ER (100MG Tablet ER 24HR, 1 Oral daily, Taken starting 09/02/2013) Active. Hydrochlorothiazide (25MG Tablet, 1 Oral daily, Taken starting 09/02/2013) Active. Losartan Potassium (100MG Tablet, 1 (one) Tablet Oral daily, Taken starting 09/02/2013) Active. Lipitor (80MG Tablet, 1 Oral daily, after a meal, Taken starting 09/17/2013) Active. MetFORMIN HCl (500MG Tablet, 1 Oral two times daily) Active. Nitrostat (0.4MG Tab Sublingual, under tongue Sublingual as directed) Active. Cialis (20MG Tablet, 1 Oral as needed) Active.    Diagnostic Studies History(Christie Beane; 12/27/2013 3:51 PM) Colonoscopy in 2006 ECG 06/17/12: S. Bradycardia @ 55/min. Inf and lat sagging ST depression. No T wave inversion. Compared to ECG 12/16/11, sagging non specific ST chages are new. Nuclear stress 5.11.12 Positive EKG changes of ischemia. No chest pain. Diaph. atten. no ischemia Exercise sestamibi 07/24/12: EKG positive for ischemia. 9 min, 12.5 METs. No chest pain. Diaph attenu Heart cath  6/11/12Mid RCA 4x18 Non-DES, scoring balloon PTCA PDA 2x10.    Review of Systems(Miguel Carlynn Herald, MD; 12/27/2013 4:43 PM) General:Not Present- Fatigue, Fever and Night Sweats. Skin:Not Present- Itching and Rash. HEENT:Not Present- Headache. Respiratory:Not Present- Cough, Wheezing and Wakes up from Sleep Wheezing or Short of Breath. Cardiovascular:Not Present- Claudications, Fainting, Orthopnea and Swelling of Extremities. Gastrointestinal:Not Present- Abdominal Pain, Constipation, Diarrhea, Nausea and Vomiting. Musculoskeletal:Not Present- Joint Swelling. Neurological:Not Present- Headaches. Hematology:Not Present- Blood Clots, Easy Bruising and Nose Bleed. All other systems negative    Vitals(Christie Beane; 12/27/2013 4:05 PM) 12/27/2013 4:04 PM Pulse: 73 (Regular) BP: 118/68 (Supine, Left Arm, Standard)  12/27/2013 4:02 PM Pulse: 59 (Regular) BP: 110/60 (Standing, Left Arm, Standard)  12/27/2013 3:52 PM Weight: 214 lb Height: 71 in Body Surface Area: 2.21 m Body Mass Index: 29.85 kg/m Pulse: 50 (Regular) BP: 128/72 (Sitting, Left Arm, Standard)     Physical Exam(Miguel Carlynn Herald, MD; 12/27/2013 4:42 PM) The physical exam findings are as follows:  Note: General: Well built and overweight but musculalar body habitus who is in no acute distress. Appears stated age. Alert Ox3.   There is no cyanosis. HEENT: normal limits. PERRLA, No JVD.   CARDIAC EXAM: S1, S2 normal, no gallop present. No murmur.   CHEST EXAM: No tenderness of chest wall. LUNGS: Clear to percuss and auscultate.  ABDOMEN: No hepatosplenomegaly. BS normal in all 4 quadrants. Abdomen is non-tender.   EXTREMITY: Full range of movementes, No edema. MUSCULOSKELETAL EXAM: Intact with full range of motion in all 4 extremities.   NEUROLOGIC EXAM: Grossly intact without any focal deficits. Alert O x 3.   VASCULAR EXAM: No skin breakdown. Carotids  left soft bruit. Extremities: Femoral pulse normal. Popliteal pulse normal ; Pedal pulse normal. Otherwise No prominent pulse felt in the abdomen. No varicose veins.    Assessment & Plan(Miguel R  Einar Gip, MD; 12/27/2013 4:52 PM) Coronary atherosclerosis of native coronary artery (414.01) Story: EKG pulse/29/2014: Sinus bradycardia at rate of 57 bpm, left atrial abnormality, nonspecific inferior ST segment depression. Normal QT interval. No evidence of ischemia. No change 02/03/13.  Exercise sestamibi 07/24/12: EKG positive for ischemia. 9 min, 12.5 METs. No chest pain. Diaphamatic attenuation without ischemia.   Heart Cath: 05/21/11 Proximal RCA 90%, Large PDA with bifurcationg 80% stenosis, PL normal, LAD severe proximal and mid diffuse disease and distal tandem 90% stenosis and Proximal Circumflex 80% stenosis. Normal LVEF.  Staged intervention: 05/28/11 Proximal and Mid LAD stent 3x22 and 3x38 Resolute and Xience Prime DES. Proximal Circumflex 3x22 Resolute stent. 06/10/11 Stenting of mid RCA with 4x18 Integrity stent (non-DES) and scoring balloon angioplasty of PDA with 2.0x10 Balloon. Current Plans l Complete electrocardiogram (93000) l Pt Education - Atherosclerosis: discussed with patient and provided information.  Shortness of breath (786.05)  Essential hypertension, benign (401.1)  Pure hypercholesterolemia (272.0) Story: Lipid profile 09/30/2013: Total cholesterol 122, triglycerides 136, HDL 45, LDL 50. LP-IR score normal. LFT normal.  Labs done on 06/26/2012 reveals total cholesterol 232, triglycerides 559, HDL 33, LDL could not be calculated. Non-HDL cholesterol was 199.  Left carotid bruit (785.9)  Controlled diabetes mellitus type II without complication (629.52) Current Plans l Mechanism of underlying disease process and action of medications discussed with the patient. I discussed primary/secondary prevention and also dietary counceling was done.  Patric Buckhalter is a 68 year old Asian male with history of known coronary artery disease, diabetes mellitus and hyperlipidemia presents here for follow-up of new onset of shortness of breath that started about 2-3 weeks ago. He also has noticed mild chest discomfort especially at night. He has felt that something is wrong and felt that the blockages were coming back. He has not used any sublingual nitroglycerin.  His EKG does not reveal any significant abnormality compared to prior, however the inferior ST segment depression appears to be slightly more prominent but does not meet criteria for ischemia. Given his multiple cardiac vascular risk factors and new onset of shortness of breath in a patient with known severe multivessel coronary artery disease, I'll set him up for a exercise Myoview stress test. He has new carotid bruit on his left, I will also set him up for a carotid duplex. Office visit after this. He is advised to contact me if his symptoms are getting worse.  CC: Velna Hatchet, MD     Addendum Sunday Spillers, MD; 01/10/2014 6:21 PM) Exercise Myoview stress test 01/07/2014: 1. Resting EKG showed normal sinus rhythm, poor R wave progression. Stress EKG was positive for ischemia. There was 2.5 mm ST depression noted in the inferior and lateral leads with exercise persisting for > 3 minutes into recovery. No chest pain reported. The test was terminated due to fatigue and achievement of the target heart rate. Patient exercised on BRUCE PROTOCOL for 7 minutes 0 seconds. The maximum work level achieved was 7.6 MET's. The test was terminated due to achievement of the target heart rate. Normal BP response. 2. Perfusion imaging study demonstrated a moderate-sized moderate ischemia in the inferior wall extending from the mid ventricle towards the apex including the inferoapical segment. Dynamic gated images reveal normal wall motion and endocardial thickening. Left ventricular  ejection fraction was estimated to be 61%. Compared to the study done on 07/24/2012, ischemia is new. This represents intermediate risk study. Patient has history of non-drug-eluting stent to the right coronary artery and balloon angioplasty to  the PDA branch of RCA, and the study may represent restenosis in the segments. Clinical correlation recommended.  Carotid artery duplex 01/10/2014: 1. Moderate stenosis of the right distal internal carotid artery and mid internal carotid artery (>50% stenosis in the range of 50-69%). Mild stenosis of the right bulb (<50% stenosis in the range of 16-49%). 2. No evidence of hemodynamically significant stenosis in the left carotid bifurcation vessels. Follow up in six months is appropriate if clinically indicated.  I have discussed the findings of the stress test with the patient in the office today. Patient has started with new chest pain symptoms, and the stress test reveals abnormality in the inferior wall that may suggest restenosis. Patient wishes to proceed with coronary angiography due to presence of his symptoms. He understands the risks, benefits and alternatives to coronary angiography. We will set this up in the outpatient basis. Schedule for cardiac catheterization, and possible angioplasty. We discussed regarding risks, benefits, alternatives to this including stress testing, CTA and continued medical therapy. Patient wants to proceed. Understands <1-2% risk of death, stroke, MI, urgent CABG, bleeding, infection, renal failure but not limited to these. Video recording of the procedure shown to the patient.   Signed electronically by Laverda Page, MD (12/27/2013 4:52 PM)

## 2014-01-11 NOTE — CV Procedure (Addendum)
PROCEDURE PERFORMED:  1. Left ventriculography.  2. Selective right and left coronary arteriography.  3. PTCA and stenting of the mid RCA, PTCA and stenting of the mid LAD.  INDICATIONS: Mr. Franz Svec is a diabetic 68 year old gentleman who has history of hypertension and hyperlipidemia who had been doing well until recently, had chest discomfort, which is exertional. This started about 2 months ago. Given his significant chest pain and multiple cardiovascular risk factors and his chest pain very typical for angina pectoris, he underwent outpatient stress testing last week which had revealed severe inferior wall ischemia which was new. Hence brought to the coronary angiography suite with the thought of either in-stent restenosis in the previously placed RCA stent or new lesion.  Patient has history of staged intervention to LAD with implantation of a 3.0 x 22 mm and a 3.0 x 38 mm resolute and Xience, proximal circumflex 3.0 x 22 mm resolute DES placed on 05/28/2011. Staged intervention to the RCA on 06/10/2011 with implantation of a 4.0 x 18 mm integrity non-drug-eluting stent in the mid RCA and scoring balloon angioplasty of the primary PDA branch.    HEMODYNAMIC DATA: The left ventricular pressure was  99/7 with end- diastolic pressure of 13 mmHg. Aortic pressure was 93/58 with a mean of 75 mmHg. There was no pressure gradient across the aortic valve.   ANGIOGRAPHIC DATA: Left ventricle. Left ventricular systolic function was normal with an ejection fraction of 55% to 60% with low regional wall motion abnormalities.   Right coronary artery: Right coronary artery is a very large-caliber vessel and a dominant vessel giving origin to large PDA, which bifurcates and a large PL branch. The RCA in the proximal segment has an 80% focal stenosis, just proximal to the previously placed stent, 4.0 x 18 mm integrity non-drug-eluting stent followed by diffuse disease in the distal right coronary artery  involving the PD branch. The PDA but bifurcates immediately and the large branch which was previously angioplastied with scoring balloon is widely patent at the site. Secondary PD branch has high-grade ostial 90% stenosis which is unchanged from prior angiography.  Left main coronary artery: Left main coronary artery is a large-caliber vessel. Smooth and normal.   LAD: LAD is a large-caliber vessel in the proximal segment giving origin to high diagonal one, which is smooth with mild luminal irregularity in the midsegment. The LAD itself after the origin of  diagonal is severely diffusely diseased. The previously placed stents, 3.0 x 22 mm and a 3.0 x 38 mm resolute and Xience, are widely patent and distal to the stent there is a high-grade 80% hazy stenosis followed by a tandem 30-40% stenosis. Distal/apical LAD is subtotally occluded with diffuse disease and this is old.  Circumflex coronary artery: Circumflex coronary artery has mild diffuse disease. This a large-caliber vessel. The previously placed stents,  3.0 x 22 mm resolute DES in the proximal circumflex and mid circumflex is widely patent. Circumflex gives origin to a large OM1 which gives second branch and continues in the AV groove.  Interventional data: Successful PTCA and direct stenting of the mid RCA with implantation of a 4.0 x 24 mm promos Premier drug-eluting stent. Stenosis reduced from 80% to 0%. Successful PTCA and direct stenting of the mid LAD overlapping the previously placed stents. A 2.5 x 8 mm promos Premier drug-eluting stent was implanted with Celexa stenosis from 80% to 0%. I was also able to cross the apical LAD subtotally occluded stenosis, chronic total occlusion with  the guidewire for Dottering effect, revealed improved flow.   TECHNIQUE OF THE PROCEDURE: Under sterile precautions, using a 5-French right radial access, a 5-French Tig #4 catheter was advanced into the ascending aorta. Selective right and left coronary  arteriography was performed. Catheter was then exchanged with the help of a long exchange length J-wire for a 5-French pigtail catheter and left ventriculography was performed both in LAO and RAO projection. Then the catheter was then pulled into the asymmetric aorta then out of the body over a J- wire. Intracoronary nitroglycerin was administered during angiography.  Overall, the patient tolerated the procedure well. No immediate complications.  Technique of intervention: Using Angiomax for anticoagulation, using a 6 [place] for 4 guide, a cougar XT guidewire was utilized to cross the RCA. Direct stenting was performed into the mid RCA with implantation of a 4.0 x 24 mm promos Premier drug-eluting stent implanted at 10 atmospheric pressure, there was a significant waist noted at the tightest stenosis. The same stent balloon was advanced distally into the previously placed old stent and keeping the edges away from the balloon, a 14 atmospheric dilatation was performed with the stent balloon at 30 seconds, which reduced the stenosis from 80% to 0% with brisk flow. This secondary PD branch lesion was left alone as it has remained stable and the primary PDA branch which was balloon angioplasty previously he is widely patent.  Attention was directed towards the LAD. Using XB 3.56 [place] guide catheter the same cougar XT guidewire was advanced into the LAD. I directly stented this with a 2.5 x 8 mm promos Premier drug-eluting stent which was deployed at 12 atmospheric pressure for 50 seconds followed by gently withdrawing the same stent balloon to the previously placed mid LAD stent and a second inflation at 16 atmospheric pressure for 40 seconds performed followed by angiography. Excellent results were evident. The tandem distal stenosis, distal to the newly placed stent, had only mild disease and multiple views were taken to exclude any edge dissection. During the process intracoronary nitroglycerin was administered  both prior to angioplasty and post angioplasty. Excellent results were evident. The subtotally occluded distal LAD was also wired with the same cougar XT wire for.dottering effect, better flow in the distally was evident after angioplasty. The guide catheter was then disengaged and pulled out of the body over J-wire. Hemostasis was obtained by applying TR band. Patient will be discharged home in the morning if he remains stable. A total of 130 cc of contrast was utilized for diagnostic and interventional procedure.  Patient has been started on Effient and this will be continued for at least a period of one year.

## 2014-01-11 NOTE — Progress Notes (Signed)
TR BAND REMOVAL  LOCATION:    right radial  DEFLATED PER PROTOCOL:    yes  TIME BAND OFF / DRESSING APPLIED:    1500   SITE UPON ARRIVAL:    Level 0  SITE AFTER BAND REMOVAL:    Level 0  REVERSE ALLEN'S TEST:     positive  CIRCULATION SENSATION AND MOVEMENT:    Within Normal Limits   yes  COMMENTS:   Tolerated procedure well 

## 2014-01-12 DIAGNOSIS — Z7982 Long term (current) use of aspirin: Secondary | ICD-10-CM | POA: Diagnosis not present

## 2014-01-12 DIAGNOSIS — E785 Hyperlipidemia, unspecified: Secondary | ICD-10-CM | POA: Diagnosis not present

## 2014-01-12 DIAGNOSIS — I10 Essential (primary) hypertension: Secondary | ICD-10-CM | POA: Diagnosis not present

## 2014-01-12 DIAGNOSIS — I2510 Atherosclerotic heart disease of native coronary artery without angina pectoris: Secondary | ICD-10-CM | POA: Diagnosis not present

## 2014-01-12 DIAGNOSIS — E119 Type 2 diabetes mellitus without complications: Secondary | ICD-10-CM | POA: Diagnosis not present

## 2014-01-12 DIAGNOSIS — I209 Angina pectoris, unspecified: Secondary | ICD-10-CM | POA: Diagnosis not present

## 2014-01-12 LAB — BASIC METABOLIC PANEL
BUN: 18 mg/dL (ref 6–23)
CHLORIDE: 107 meq/L (ref 96–112)
CO2: 21 mEq/L (ref 19–32)
Calcium: 8.8 mg/dL (ref 8.4–10.5)
Creatinine, Ser: 1.17 mg/dL (ref 0.50–1.35)
GFR calc non Af Amer: 62 mL/min — ABNORMAL LOW (ref >90)
GFR, EST AFRICAN AMERICAN: 72 mL/min — AB (ref >90)
Glucose, Bld: 122 mg/dL — ABNORMAL HIGH (ref 70–99)
POTASSIUM: 3.8 meq/L (ref 3.7–5.3)
Sodium: 141 mEq/L (ref 137–147)

## 2014-01-12 LAB — CBC
HEMATOCRIT: 35.2 % — AB (ref 39.0–52.0)
Hemoglobin: 12.2 g/dL — ABNORMAL LOW (ref 13.0–17.0)
MCH: 33.2 pg (ref 26.0–34.0)
MCHC: 34.7 g/dL (ref 30.0–36.0)
MCV: 95.7 fL (ref 78.0–100.0)
PLATELETS: 214 10*3/uL (ref 150–400)
RBC: 3.68 MIL/uL — ABNORMAL LOW (ref 4.22–5.81)
RDW: 13.5 % (ref 11.5–15.5)
WBC: 7.2 10*3/uL (ref 4.0–10.5)

## 2014-01-12 LAB — GLUCOSE, CAPILLARY: Glucose-Capillary: 126 mg/dL — ABNORMAL HIGH (ref 70–99)

## 2014-01-12 MED ORDER — PRASUGREL HCL 10 MG PO TABS: 10.0000 mg | ORAL_TABLET | Freq: Every day | ORAL | Status: DC

## 2014-01-12 MED ORDER — ASPIRIN 81 MG PO TABS: 325.0000 mg | ORAL_TABLET | Freq: Every day | ORAL | Status: DC

## 2014-01-12 MED FILL — Sodium Chloride IV Soln 0.9%: INTRAVENOUS | Qty: 50 | Status: AC

## 2014-01-12 NOTE — Discharge Summary (Signed)
Physician Discharge Summary  Patient ID: Miguel Brock MRN: [id number] DOB/AGE: 1946-02-25 68 y.o.  Admit date: 01/11/2014 Discharge date: 01/12/2014  Primary Discharge Diagnosis 1. CAD, Staged intervention: 05/28/11 Proximal and Mid LAD stent 3x22 and 3x38 Resolute and Xience Prime DES. Proximal Circumflex 3x22 Resolute stent.  06/10/11 Stenting of mid RCA with 4x18 Integrity stent (non-DES) and scoring balloon angioplasty of PDA with 2.0x10 Balloon. 01/11/2014: Successful PTCA and direct stenting of the mid RCA with implantation of a 4.0 x 24 mm promos Premier drug-eluting stent. Stenosis reduced from 80% to 0%. Successful PTCA and direct stenting of the mid LAD overlapping the previously placed stents. A 2.5 x 8 mm promos Premier drug-eluting stent  Secondary Discharge Diagnosis Hypertension Hyperlipidemia Carotid artery bruit Controlled type 2 diabetes mellitus  Significant Diagnostic Studies: 01/11/2014: Successful PTCA and direct stenting of the mid RCA with implantation of a 4.0 x 24 mm promos Premier drug-eluting stent. Stenosis reduced from 80% to 0%. Successful PTCA and direct stenting of the mid LAD overlapping the previously placed stents. A 2.5 x 8 mm promos Premier drug-eluting stent was implanted with Celexa stenosis from 80% to 0%. I was also able to cross the apical LAD subtotally occluded stenosis, chronic total occlusion with the guidewire for Dottering effect, revealed improved flow.  Hospital Course: The patient is a 68 year old male who presents for a recheck of CAD. [name] is a 68 year old Caucasian male with history of known coronary artery disease presents here for followup of CAD. Patient has undergone staged intervention to mid LAD and circumflex coronary artery on 05/28/2011 and to mid RCA on 06/10/2011 with implantation of drug-eluting stents to the left system and nondrug eluding stent to the right coronary artery.  Stent data included:  LAD with implantation  of a 3.0 x 22 mm and a 3.0 x 38 mm resolute and Xience, proximal circumflex 3.0 x 22 mm resolute DES placed on 05/28/2011. Staged intervention to the RCA on 06/10/2011 with implantation of a 4.0 x 18 mm integrity non-drug-eluting stent in the mid RCA and scoring balloon angioplasty of the primary PDA branch.   Due to chest pain suggestive of angina pectoris, he underwent outpatient stress testing which revealed severe inferior wall ischemia. He was brought to the coronary angiography suite an elective fashion, and coronary angiography did reveal high-grade stenosis of the mid RCA proximal to the previously placed stent. He underwent successful angioplasty and stenting of the same. Also on the LAD system, just distal to the previously placed stent, there was high-grade stenosis noted. Due to the risk of probable proximal stent occlusion due to decreased outflow from high-grade stenosis, I decided to treat the lesion directly with stenting. Patient overall tolerated the procedure well with excellent angiographic results.  The following morning he remained stable without any recurrence of chest pain and ablated in the hallway without any problems. Felt stable for discharge.   Recommendations on discharge: Aspirin will be reduced to 81 mg from 03/07/2024 milligrams and patient has been restarted on Effient and will appear daily which will be continued for at least a period of one year.  Discharge Exam: Blood pressure 132/61, pulse 57, temperature 97.9 F (36.6 C), temperature source Oral, resp. rate 57, height 5\' 11"  (1.803 m), weight 96.7 kg (213 lb 3 oz), SpO2 97.00%.  General: Well built and overweight but musculalar body habitus who is in no acute distress. Appears stated age. Alert Ox3.  There is no cyanosis. HEENT: normal limits.  PERRLA, No JVD.  CARDIAC EXAM: S1, S2 normal, no gallop present. No murmur.  CHEST EXAM: No tenderness of chest wall. LUNGS: Clear to percuss and auscultate.  ABDOMEN: No  hepatosplenomegaly. BS normal in all 4 quadrants. Abdomen is non-tender.  EXTREMITY: Full range of movementes, No edema. MUSCULOSKELETAL EXAM: Intact with full range of motion in all 4 extremities.  NEUROLOGIC EXAM: Grossly intact without any focal deficits. Alert O x 3.  VASCULAR EXAM: No skin breakdown. Carotids left soft bruit. Extremities: Femoral pulse normal. Popliteal pulse normal ; Pedal pulse normal. Otherwise No prominent pulse felt in the abdomen. No varicose veins. Right radial arterial access site without any complication.  Labs:   Lab Results  Component Value Date   WBC 7.2 01/12/2014   HGB 12.2* 01/12/2014   HCT 35.2* 01/12/2014   MCV 95.7 01/12/2014   PLT 214 01/12/2014    Recent Labs Lab 01/12/14 0430  NA 141  K 3.8  CL 107  CO2 21  BUN 18  CREATININE 1.17  CALCIUM 8.8  GLUCOSE 122*   EKG 01/12/2014: Normal sinus rhythm. No evidence of ischemia. Radiology: No results found.    FOLLOW UP PLANS AND APPOINTMENTS    Medication List         aspirin 81 MG tablet  Take 4 tablets (325 mg total) by mouth daily.     atorvastatin 80 MG tablet  Commonly known as:  LIPITOR  Take 80 mg by mouth every morning.     diphenhydrAMINE 25 MG tablet  Commonly known as:  BENADRYL  Take 1 tablet (25 mg total) by mouth every 6 (six) hours.     EPINEPHrine 0.3 mg/0.3 mL Soaj injection  Commonly known as:  EPIPEN  Inject 0.3 mLs (0.3 mg total) into the muscle once.     famotidine 20 MG tablet  Commonly known as:  PEPCID  Take 1 tablet (20 mg total) by mouth 2 (two) times daily.     hydrochlorothiazide 25 MG tablet  Commonly known as:  HYDRODIURIL  Take 25 mg by mouth every morning.     losartan 100 MG tablet  Commonly known as:  COZAAR  Take 100 mg by mouth every morning.     metFORMIN 500 MG tablet  Commonly known as:  GLUCOPHAGE  Take 500 mg by mouth 2 (two) times daily with a meal.     methocarbamol 500 MG tablet  Commonly known as:  ROBAXIN  Take 500 mg by  mouth 3 (three) times daily as needed (pain).     metoprolol succinate 100 MG 24 hr tablet  Commonly known as:  TOPROL-XL  Take 100 mg by mouth daily. Take with or immediately following a meal.     nitroGLYCERIN 0.4 MG SL tablet  Commonly known as:  NITROSTAT  Place 0.4 mg under the tongue every 5 (five) minutes as needed for chest pain.     prasugrel 10 MG Tabs tablet  Commonly known as:  EFFIENT  Take 1 tablet (10 mg total) by mouth daily.           Follow-up Information   Follow up with Laverda Page, MD. (Keep previous appointment)    Specialty:  Cardiology   Contact information:   [facility] [place] [address] [phone]        Laverda Page, MD 01/12/2014, 9:08 AM  Pager: [phone] Office: [phone] If no answer: [phone]

## 2014-01-12 NOTE — Progress Notes (Signed)
CARDIAC REHAB PHASE I   PRE:  Rate/Rhythm: 72SR  BP:  Supine: 132/61  Sitting:   Standing:    SaO2:   MODE:  Ambulation: 460 ft   POST:  Rate/Rhythm: 76SR  BP:  Supine:   Sitting: 136/74  Standing:    SaO2:  0750-0825 Pt walked 460 ft with steady gait. No CP. Tolerated well. Pt stated he had attended CRP 2 before and did not need education. Gave heart healthy and diabetic diets to read. Offered CRP 2 again which he declined. Will walk on his own. Gave exercise ed to help pt increase walking. Has effient and stent booklets. Very pleasant. Reviewed NTG use.    Graylon Good, RN BSN  01/12/2014 8:20 AM

## 2014-01-12 NOTE — Care Management Note (Signed)
    Page 1 of 1   01/12/2014     11:15:09 AM   CARE MANAGEMENT NOTE 01/12/2014  Patient:  Miguel Brock, Miguel Brock   Account Number:  [id number]  Date Initiated:  01/12/2014  Documentation initiated by:  [facility]  Subjective/Objective Assessment:   68yo male pt c/o dizziness, sob, light headed//Home with spouse [phone] (m)]     Action/Plan:   LEFT HEART CATHETERIZATION WITH CORONARY ANGIOGRAM (N/A) and possible PCI//Home with self care; benefits check for Effient 10mg  QD)   Anticipated DC Date:  01/12/2014   Anticipated DC Plan:  [place]  CM consult      Choice offered to / List presented to:             Status of service:   Medicare Important Message given?   (If response is "NO", the following Medicare IM given date fields will be blank) Date Medicare IM given:   Date Additional Medicare IM given:    Discharge Disposition:    Per UR Regulation:    If discussed at Long Length of Stay Meetings, dates discussed:    Comments:  Spoke with pt at bedside regarding benefits check for Effient 10mg .  Pt has brochure with 30 day free card and refill assistance card intact.  Pt utilizes [employer]  for prescription needs.  NCM called pharmacy to confirm availability of medication.  Information relayed to pt.  Pt verbalizes importance of filling medication upon discharge.

## 2014-01-20 DIAGNOSIS — I2510 Atherosclerotic heart disease of native coronary artery without angina pectoris: Secondary | ICD-10-CM | POA: Diagnosis not present

## 2014-01-20 DIAGNOSIS — E7800 Pure hypercholesterolemia, unspecified: Secondary | ICD-10-CM | POA: Diagnosis not present

## 2014-01-20 DIAGNOSIS — I10 Essential (primary) hypertension: Secondary | ICD-10-CM | POA: Diagnosis not present

## 2014-01-20 DIAGNOSIS — R0989 Other specified symptoms and signs involving the circulatory and respiratory systems: Secondary | ICD-10-CM | POA: Diagnosis not present

## 2014-01-31 DIAGNOSIS — J209 Acute bronchitis, unspecified: Secondary | ICD-10-CM | POA: Diagnosis not present

## 2014-01-31 DIAGNOSIS — I10 Essential (primary) hypertension: Secondary | ICD-10-CM | POA: Diagnosis not present

## 2014-01-31 DIAGNOSIS — E785 Hyperlipidemia, unspecified: Secondary | ICD-10-CM | POA: Diagnosis not present

## 2014-01-31 DIAGNOSIS — K644 Residual hemorrhoidal skin tags: Secondary | ICD-10-CM | POA: Diagnosis not present

## 2014-01-31 DIAGNOSIS — I259 Chronic ischemic heart disease, unspecified: Secondary | ICD-10-CM | POA: Diagnosis not present

## 2014-01-31 DIAGNOSIS — Z6831 Body mass index (BMI) 31.0-31.9, adult: Secondary | ICD-10-CM | POA: Diagnosis not present

## 2014-01-31 DIAGNOSIS — E1169 Type 2 diabetes mellitus with other specified complication: Secondary | ICD-10-CM | POA: Diagnosis not present

## 2014-02-16 ENCOUNTER — Encounter (INDEPENDENT_AMBULATORY_CARE_PROVIDER_SITE_OTHER): Payer: Self-pay | Admitting: Surgery

## 2014-02-16 ENCOUNTER — Ambulatory Visit (INDEPENDENT_AMBULATORY_CARE_PROVIDER_SITE_OTHER): Payer: Medicare Other | Admitting: Surgery

## 2014-02-16 VITALS — BP 142/84 | HR 72 | Temp 97.8°F | Resp 14 | Ht 71.0 in | Wt 222.2 lb

## 2014-02-16 DIAGNOSIS — K219 Gastro-esophageal reflux disease without esophagitis: Secondary | ICD-10-CM | POA: Insufficient documentation

## 2014-02-16 DIAGNOSIS — I2510 Atherosclerotic heart disease of native coronary artery without angina pectoris: Secondary | ICD-10-CM | POA: Insufficient documentation

## 2014-02-16 DIAGNOSIS — K648 Other hemorrhoids: Secondary | ICD-10-CM | POA: Diagnosis not present

## 2014-02-16 NOTE — Progress Notes (Signed)
Subjective:     Patient ID: Miguel Brock, male   DOB: 08-May-1946, 68 y.o.   MRN: [id number]  HPI  Note: This dictation was prepared with Dragon/digital dictation along with [employer]. Any transcriptional errors that result from this process are unintentional.       Miguel Brock  [birth year] [id number]  Patient Care Team: Velna Hatchet, MD as PCP - General (Internal Medicine)  This patient is a 68 y.o.male who presents today for surgical evaluation at the request of Dr. Ardeth Perfect.   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  Using tolerable.  However, they have become more bothersome, noticeably prolapsing.  One hemorrhoid pile is prolapsed all the time.  Bleeding and more regularly.  Painful and uncomfortable.  He usually has mild constipation when medications are adjusted.  Usually 1-2 BM/day.  He can walk 20-30 minutes okay.  He did have an episode of chest pain that required catheterization, angioplasty, and stenting last month.  He is on chronic anticoagulation.  His wife is a Marine scientist and recalls being told that patients can be banded for hemorrhoids  No personal nor family history of GI/colon cancer, inflammatory bowel disease, irritable bowel syndrome, allergy such as Celiac Sprue, dietary/dairy problems, colitis, ulcers nor gastritis.  No recent sick contacts/gastroenteritis.  No travel outside the country.  No changes in diet.  No dysphagia to solids or liquids.  No significant heartburn or reflux.  No hematochezia, hematemesis, coffee ground emesis.  No evidence of prior gastric/peptic ulceration.  He recalls a normal colonoscopy in the distant past.  He is due for a follow up colonoscopy soon.  Patient Active Problem List   Diagnosis Date Noted  . Internal prolapsing hemorrhoids with pain/bleeding 02/16/2014  . Coronary artery disease   . GERD (gastroesophageal reflux disease)   . Postsurgical percutaneous  transluminal coronary angioplasty (PTCA) status 01/11/2014    Past Medical History  Diagnosis Date  . Anginal pain     occ  . Coronary artery disease 11    stents x5  . Hypertension   . Pneumonia     hx  . GERD (gastroesophageal reflux disease)     zantac  . H/O hiatal hernia   . Hepatitis 69's    food   . Diabetes mellitus without complication     TYPE 2  . Bursitis of left hip     Past Surgical History  Procedure Laterality Date  . Coronary artery bypass graft  11  . Elbow arthroscopy Right 89    ulner nerve rel  . Shoulder arthroscopy Left     release of tendon  . Thrumb Right   . Hernia repair Right 50  . Anterior fusion lumbar spine      L5 S1    07/07/2013      Dr Rolena Infante  . Anterior lumbar fusion N/A 07/07/2013    Procedure: ANTERIOR LUMBAR FUSION 1 LEVEL ALIF L5-S1;  Surgeon: Melina Schools, MD;  Location: [facility];  Service: Orthopedics;  Laterality: N/A;  . Abdominal exposure N/A 07/07/2013    Procedure: ABDOMINAL EXPOSURE FOR ANTERIOR LUMBAR FUSION;  Surgeon: Rosetta Posner, MD;  Location: [facility] OR;  Service: Vascular;  Laterality: N/A;  . Coronary angioplasty  01/11/2014    LAD & CIRCUMFLEX    History   Social History  . Marital Status: Married    Spouse Name: N/A    Number of Children: N/A  . Years of Education:  N/A   Occupational History  . Not on file.   Social History Main Topics  . Smoking status: Former Smoker -- 1.50 packs/day for 23 years    Types: Cigarettes    Quit date: 07/06/1983  . Smokeless tobacco: Never Used  . Alcohol Use: 12.6 oz/week    21 Shots of liquor per week  . Drug Use: No  . Sexual Activity: Not on file   Other Topics Concern  . Not on file   Social History Narrative  . No narrative on file    Family History  Problem Relation Age of Onset  . Heart disease Father     Current Outpatient Prescriptions  Medication Sig Dispense Refill  . aspirin 81 MG tablet Take 4 tablets (325 mg total) by mouth daily.      .  diphenhydrAMINE (BENADRYL) 25 MG tablet Take 1 tablet (25 mg total) by mouth every 6 (six) hours.  20 tablet  0  . EPINEPHrine (EPIPEN) 0.3 mg/0.3 mL SOAJ Inject 0.3 mLs (0.3 mg total) into the muscle once.  1 Device  1  . famotidine (PEPCID) 20 MG tablet Take 1 tablet (20 mg total) by mouth 2 (two) times daily.  30 tablet  0  . hydrochlorothiazide (HYDRODIURIL) 25 MG tablet Take 25 mg by mouth every morning.       Marland Kitchen losartan (COZAAR) 100 MG tablet Take 100 mg by mouth every morning.       . metFORMIN (GLUCOPHAGE) 500 MG tablet Take 500 mg by mouth 2 (two) times daily with a meal.      . methocarbamol (ROBAXIN) 500 MG tablet Take 500 mg by mouth 3 (three) times daily as needed (pain).      . metoprolol succinate (TOPROL-XL) 100 MG 24 hr tablet Take 100 mg by mouth daily. Take with or immediately following a meal.      . nitroGLYCERIN (NITROSTAT) 0.4 MG SL tablet Place 0.4 mg under the tongue every 5 (five) minutes as needed for chest pain.      . prasugrel (EFFIENT) 10 MG TABS tablet Take 1 tablet (10 mg total) by mouth daily.  30 tablet  0   No current facility-administered medications for this visit.     Allergies  Allergen Reactions  . Lisinopril     cough    BP 142/84  Pulse 72  Temp(Src) 97.8 F (36.6 C) (Oral)  Resp 14  Ht 5\' 11"  (1.803 m)  Wt 222 lb 3.2 oz (100.789 kg)  BMI 31.00 kg/m2  No results found.   Review of Systems  Constitutional: Negative for fever, chills and diaphoresis.  HENT: Negative for ear discharge, facial swelling, mouth sores, nosebleeds, sore throat and trouble swallowing.   Eyes: Negative for photophobia, discharge and visual disturbance.  Respiratory: Negative for choking, chest tightness, shortness of breath and stridor.   Cardiovascular: Negative for chest pain and palpitations.  Gastrointestinal: Positive for anal bleeding and rectal pain. Negative for nausea, vomiting, abdominal pain, diarrhea, constipation, blood in stool and abdominal  distention.  Endocrine: Negative for cold intolerance and heat intolerance.  Genitourinary: Negative for dysuria, urgency, difficulty urinating and testicular pain.  Musculoskeletal: Negative for arthralgias, back pain, gait problem and myalgias.  Skin: Negative for color change, pallor, rash and wound.  Allergic/Immunologic: Negative for environmental allergies and food allergies.  Neurological: Negative for dizziness, speech difficulty, weakness, numbness and headaches.  Hematological: Negative for adenopathy. Does not bruise/bleed easily.  Psychiatric/Behavioral: Negative for hallucinations, confusion and agitation.  Objective:   Physical Exam  Constitutional: He is oriented to person, place, and time. He appears well-developed and well-nourished. No distress.  HENT:  Head: Normocephalic.  Mouth/Throat: Oropharynx is clear and moist. No oropharyngeal exudate.  Eyes: Conjunctivae and EOM are normal. Pupils are equal, round, and reactive to light. No scleral icterus.  Neck: Normal range of motion. Neck supple. No tracheal deviation present.  Cardiovascular: Normal rate, regular rhythm and intact distal pulses.   Pulmonary/Chest: Effort normal and breath sounds normal. No respiratory distress.  Abdominal: Soft. He exhibits no distension. There is no tenderness. Hernia confirmed negative in the right inguinal area and confirmed negative in the left inguinal area.  Genitourinary: Prostate normal. Rectal exam shows internal hemorrhoid. Rectal exam shows no fissure and anal tone normal. Prostate is not enlarged and not tender.  Exam done with assistance of male Medical Assistant in the room.  Perianal skin clean with good hygiene.  No pruritis.  No pilonidal disease.  No fissure.  No abscess/fistula.    Normal sphincter tone.  Tolerates digital and anoscopic rectal exam.  R ant hemorrhoid partially prolapsed.  R post/L lat piles enalrged & partially prolapse w straining  No rectal  masses.     Musculoskeletal: Normal range of motion. He exhibits no tenderness.  Lymphadenopathy:    He has no cervical adenopathy.       Right: No inguinal adenopathy present.       Left: No inguinal adenopathy present.  Neurological: He is alert and oriented to person, place, and time. No cranial nerve deficit. He exhibits normal muscle tone. Coordination normal.  Skin: Skin is warm and dry. No rash noted. He is not diaphoretic. No erythema. No pallor.  Psychiatric: He has a normal mood and affect. His behavior is normal. Judgment and thought content normal.       Assessment:     Hemorrhoids with partial and intermittent prolapse.  Very symptomatic.  One month status post coronary stenting and full anticoagulation for coronary event.     Plan:     Assessment because he had significant disease, he is likely going to require something more aggressive done such as a THD hemorrhoidal ligation & pexy and probable resection of the partially prolapsed R anterior hemorrhoid.  However, given his recent coronary event, it is not safe to come off anticoagulation at this time.  I do think banding could work as a bridge and may be enough for him.  I discussed options.  He is interested in proceeding with banding 1st as he cannot wait.  I banded all 3 piles:   The anatomy & physiology of the anorectal region was discussed.  The pathophysiology of hemorrhoids and differential diagnosis was discussed.  Natural history progression  was discussed.   I stressed the importance of a bowel regimen to have daily soft bowel movements to minimize progression of disease.     The patient's symptoms are not adequately controlled.  Therefore, I recommended banding to treat the hemorrhoids.  I went over the technique, risks, benefits, and alternatives.   Goals of post-operative recovery were discussed as well.  Questions were answered.  The patient expressed understanding & wished to proceed.  The patient was  positioned in the lateral decubitus position.  Perianal & rectal examination was done.  Using anoscopy, I ligated the hemorrhoids above the dentate line with banding x 3 piles.  The patient tolerated the procedure well.  Educational handouts further explaining the pathology, treatment options, and bowel  regimen were given as well.   Set of screening colonoscopy as his primary care physician wished.  Will use [facility] per Dr. Ardeth Perfect

## 2014-02-16 NOTE — Patient Instructions (Signed)
HEMORRHOIDS  The rectum is the last foot of your colon, and it naturally stretches to hold stool.  Hemorrhoidal piles are natural clusters of blood vessels that help the rectum and anal canal stretch to hold stool and allow bowel movements to eliminate feces.   Hemorrhoids are abnormally swollen blood vessels in the rectum.  Too much pressure in the rectum causes hemorrhoids by forcing blood to stretch and bulge the walls of the veins, sometimes even rupturing them.  Hemorrhoids can become like varicose veins you might see on a person's legs.  Most people will develop a flare of hemorrhoids in their lifetime.  When bulging hemorrhoidal veins are irritated, they can swell, burn, itch, cause pain, and bleed.  Most flares will calm down gradually own within a few weeks.  However, once hemorrhoids are created, they are difficult to get rid of completely and tend to flare more easily than the first flare.   Fortunately, good habits and simple medical treatment usually control hemorrhoids well, and surgery is needed only in severe cases. Types of Hemorrhoids:  Internal hemorrhoids usually don't initially hurt or itch; they are deep inside the rectum and usually have no sensation. If they begin to push out (prolapse), pain and burning can occur.  However, internal hemorrhoids can bleed.  Anal bleeding should not be ignored since bleeding could come from a dangerous source like colorectal cancer, so persistent rectal bleeding should be investigated by a doctor, sometimes with a colonoscopy.  External hemorrhoids cause most of the symptoms - pain, burning, and itching. Nonirritated hemorrhoids can look like small skin tags coming out of the anus.   Thrombosed hemorrhoids can form when a hemorrhoid blood vessel bursts and causes the hemorrhoid to suddenly swell.  A purple blood clot can form in it and become an excruciatingly painful lump at the anus. Because of these unpleasant symptoms, immediate incision and  drainage by a surgeon at an office visit can provide much relief of the pain.    PREVENTION Avoiding the most frequent causes listed below will prevent most cases of hemorrhoids: Constipation Hard stools Diarrhea  Constant sitting  Straining with bowel movements Sitting on the toilet for a long time  Severe coughing  episodes Pregnancy / Childbirth  Heavy Lifting  Sometimes avoiding the above triggers is difficult:  How can you avoid sitting all day if you have a seated job? Also, we try to avoid coughing and diarrhea, but sometimes it's beyond your control.  Still, there are some practical hints to help: Keep the anal and genital area clean.  Moistened tissues such as flushable wet wipes are less irritating than toilet paper.  Using irrigating showers or bottle irrigation washing gently cleans this sensitive area.   Avoid dry toilet paper when cleaning after bowel movements.  Marland Kitchen Keep the anal and genital area dry.  Lightly pat the rectal area dry.  Avoid rubbing.  Talcum or baby powders can help GET YOUR STOOLS SOFT.   This is the most important way to prevent irritated hemorrhoids.  Hard stools are like sandpaper to the anorectal canal and will cause more problems.  The goal: ONE SOFT BOWEL MOVEMENT A DAY!  BMs from every other day to 3 times a day is a tolerable range Treat coughing, diarrhea and constipation early since irritated hemorrhoids may soon follow.  If your main job activity is seated, always stand or walk during your breaks. Make it a point to stand and walk at least 5 minutes every hour  and try to shift frequently in your chair to avoid direct rectal pressure.  Always exhale as you strain or lift. Don't hold your breath.  Do not delay or try to prevent a bowel movement when the urge is present. Exercise regularly (walking or jogging 60 minutes a day) to stimulate the bowels to move. No reading or other activity while on the toilet. If bowel movements take longer than 5 minutes,  you are too constipated. AVOID CONSTIPATION Drink plenty of liquids (1 1/2 to 2 quarts of water and other fluids a day unless fluid restricted for another medical condition). Liquids that contain caffeine (coffee a, tea, soft drinks) can be dehydrating and should be avoided until constipation is controlled. Consider minimizing milk, as dairy products may be constipating. Eat plenty of fiber (30g a day ideal, more if needed).  Fiber is the undigested part of plant food that passes into the colon, acting as "natures broom" to encourage bowel motility and movement.  Fiber can absorb and hold large amounts of water. This results in a larger, bulkier stool, which is soft and easier to pass.  Eating foods high in fiber - 12 servings - such as  Vegetables: Root (potatoes, carrots, turnips), Leafy green (lettuce, salad greens, celery, spinach), High residue (cabbage, broccoli, etc.) Fruit: Fresh, Dried (prunes, apricots, cherries), Stewed (applesauce)  Whole grain breads, pasta, whole wheat Bran cereals, muffins, etc. Consider adding supplemental bulking fiber which retains large volumes of water: Psyllium ground seeds (native plant from [place])--available as Metamucil, Konsyl, Effersyllium, Per Diem Fiber, or the less expensive generic forms.  Citrucel  (methylcellulose wood fiber) . FiberCon (Polycarbophil) Polyethylene Glycol - and "artificial" fiber commonly called Miralax or Glycolax.  It is helpful for people with gassy or bloated feelings with regular fiber Flax Seed - a less gassy natural fiber  Laxatives can be useful for a short period if constipation is severe Osmotics (Milk of Magnesia, Fleets Phospho-Soda, Magnesium Citrate)  Stimulants (Senokot,   Castor Oil,  Dulcolax, Ex-Lax)    Laxatives are not a good long-term solution as it can stress the bowels and cause too much mineral loss and dehydration.   Avoid taking laxatives for more than 7 days in a row.  AVOID DIARRHEA Switch to  liquids and simpler foods for a few days to avoid stressing your intestines further. Avoid dairy products (especially milk & ice cream) for a short time.  The intestines often can lose the ability to digest lactose when stressed. Avoid foods that cause gassiness or bloating.  Typical foods include beans and other legumes, cabbage, broccoli, and dairy foods.  Every person has some sensitivity to other foods, so listen to your body and avoid those foods that trigger problems for you. Adding fiber (Citrucel, Metamucil, FiberCon, Flax seed, Miralax) gradually can help thicken stools by absorbing excess fluid and retrain the intestines to act more normally.  Slowly increase the dose over a few weeks.  Too much fiber too soon can backfire and cause cramping & bloating. Probiotics (such as active yogurt, Align, etc) may help repopulate the intestines and colon with normal bacteria and calm down a sensitive digestive tract.  Most studies show it to be of mild help, though, and such products can be costly. Medicines: Bismuth subsalicylate (ex. Kayopectate, Pepto Bismol) every 30 minutes for up to 6 doses can help control diarrhea.  Avoid if pregnant. Loperamide (Immodium) can slow down diarrhea.  Start with two tablets (53m total) first and then try one tablet  every 6 hours.  Avoid if you are having fevers or severe pain.  If you are not better or start feeling worse, stop all medicines and call your doctor for advice Call your doctor if you are getting worse or not better.  Sometimes further testing (cultures, endoscopy, X-ray studies, bloodwork, etc) may be needed to help diagnose and treat the cause of the diarrhea. TREATMENT OF HEMORRHOID FLARE If these preventive measures fail, you must take action right away! Hemorrhoids are one condition that can be mild in the morning and become intolerable by nightfall. Most hemorrhoidal flares take several weeks to calm down.  These suggestions can help: Warm soaks.   This helps more than any topical medication.  Use up to 8 times a day.  Usually sitz baths or sitting in a warm bathtub helps.  Sitting on moist warm towels are helpful.  Switching to ice packs/cool compresses can be helpful  Use a Sitz Bath 4-8 times a day for relief A sitz bath is a warm water bath taken in the sitting position that covers only the hips and buttocks. It may be used for either healing or hygiene purposes. Sitz baths are also used to relieve pain, itching, or muscle spasms. The water may contain medicine. Moist heat will help you heal and relax.  HOME CARE INSTRUCTIONS  Take 3 to 4 sitz baths a day. 1. Fill the bathtub half full with warm water. 2. Sit in the water and open the drain a little. 3. Turn on the warm water to keep the tub half full. Keep the water running constantly. 4. Soak in the water for 15 to 20 minutes. 5. After the sitz bath, pat the affected area dry first. SEEK MEDICAL CARE IF:  You get worse instead of better. Stop the sitz baths if you get worse.  Normalize your bowels.  Extremes of diarrhea or constipation will make hemorrhoids worse.  One soft bowel movement a day is the goal.  Fiber can help get your bowels regular Wet wipes instead of toilet paper Pain control with a NSAID such as ibuprofen (Advil) or naproxen (Aleve) or acetaminophen (Tylenol) around the clock.  Narcotics are constipating and should be minimized if possible Topical creams contain steroids (bydrocortisone) or local anesthetic (xylocaine) can help make pain and itching more tolerable.   EVALUATION If hemorrhoids are still causing problems, you could benefit by an evaluation by a surgeon.  The surgeon will obtain a history and examine you.  If hemorrhoids are diagnosed, some therapies can be offered in the office, usually with an anoscope into the less sensitive area of the rectum: -injection of hemorrhoids (sclerotherapy) can scar the blood vessels of the swollen/enlarged hemorrhoids to  help shrink them down to a more normal size -rubber banding of the enlarged hemorrhoids to help shrink them down to a more normal size -drainage of the blood clot causing a thrombosed hemorrhoid,  to relieve the severe pain   While 90% of the time such problems from hemorrhoids can be managed without preceding to surgery, sometimes the hemorrhoids require a operation to control the problem (uncontrolled bleeding, prolapse, pain, etc.).   This involves being placed under general anesthesia where the surgeon can confirm the diagnosis and remove, suture, or staple the hemorrhoid(s).  Your surgeon can help you treat the problem appropriately.     HEMORRHOIDAL BANDING: POST OP INSTRUCTIONS  1. Take your usually prescribed home medications unless otherwise directed. 2. DIET: Follow a light bland diet the first  24 hours after arrival home, such as soup, liquids, crackers, etc.  Be sure to include lots of fluids daily.  Avoid fast food or heavy meals as your are more likely to get nauseated.  Eat a low fat the next few days after surgery.   3. PAIN CONTROL: a. Pain is best controlled by a usual combination of three different methods TOGETHER: i. Ice/Heat ii. Over the counter pain medication iii. Prescription pain medication b. Most patients will experience some swelling and discomfort in the anus/rectal area. and incisions.  Ice packs or heat (30-60 minutes up to 6 times a day) will help. Use ice for the first few days to help decrease swelling and bruising, then switch to heat such as warm towels, sitz baths, warm baths, etc to help relax tight/sore spots and speed recovery.  Some people prefer to use ice alone, heat alone, alternating between ice & heat.  Experiment to what works for you.  Swelling and bruising can take several weeks to resolve.   c. It is helpful to take an over-the-counter pain medication regularly for the first few weeks.  Choose one of the following that works best for  you: i. Naproxen (Aleve, etc)  Two 214m tabs twice a day ii. Ibuprofen (Advil, etc) Three 2050mtabs four times a day (every meal & bedtime) iii. Acetaminophen (Tylenol, etc) 500-65026mour times a day (every meal & bedtime) d. A  prescription for pain medication (such as oxycodone, hydrocodone, etc) should be given to you upon discharge.  Take your pain medication as prescribed.  i. If you are having problems/concerns with the prescription medicine (does not control pain, nausea, vomiting, rash, itching, etc), please call us [phone] see if we need to switch you to a different pain medicine that will work better for you and/or control your side effect better. ii. If you need a refill on your pain medication, please contact your pharmacy.  They will contact our office to request authorization. Prescriptions will not be filled after 5 pm or on week-ends.  Use a Sitz Bath 4-8 times a day for relief A sitz bath is a warm water bath taken in the sitting position that covers only the hips and buttocks. It may be used for either healing or hygiene purposes. Sitz baths are also used to relieve pain, itching, or muscle spasms. The water may contain medicine. Moist heat will help you heal and relax.  HOME CARE INSTRUCTIONS  Take 3 to 4 sitz baths a day. 6. Fill the bathtub half full with warm water. 7. Sit in the water and open the drain a little. 8. Turn on the warm water to keep the tub half full. Keep the water running constantly. 9. Soak in the water for 15 to 20 minutes. 10. After the sitz bath, pat the affected area dry first. SEEK MEDICAL CARE IF:  You get worse instead of better. Stop the sitz baths if you get worse.   4. KEEP YOUR BOWELS REGULAR a. The goal is one bowel movement a day b. Avoid getting constipated.  Between the surgery and the pain medications, it is common to experience some constipation.  Increasing fluid intake and taking a fiber supplement (such as Metamucil,  Citrucel, FiberCon, MiraLax, etc) 1-2 times a day regularly will usually help prevent this problem from occurring.  A mild laxative (prune juice, Milk of Magnesia, MiraLax, etc) should be taken according to package directions if there are no bowel movements after 48 hours.  c. Watch out for diarrhea.  If you have many loose bowel movements, simplify your diet to bland foods & liquids for a few days.  Stop any stool softeners and decrease your fiber supplement.  Switching to mild anti-diarrheal medications (Kayopectate, Pepto Bismol) can help.  If this worsens or does not improve, please call us.  5. Wound Care a. Remove your bandages the day after surgery.  Unless discharge instructions indicate otherwise, leave your bandage dry and in place overnight.  Remove the bandage during your first bowel movement.   b. Allow the wound packing to fall out over the next few days.  You can trim exposed gauze / ribbon as it falls out.  You do not need to repack the wound unless instructed otherwise.  Wear an absorbent pad or soft cotton gauze in your underwear as needed to catch any drainage and help keep the area  c. Keep the area clean and dry.  Bathe / shower every day.  Keep the area clean by showering / bathing over the incision / wound.   It is okay to soak an open wound to help wash it.  Wet wipes or showers / gentle washing after bowel movements is often less traumatic than regular toilet paper. d. Dennis Bast may have some styrofoam-like soft packing in the rectum which will come out with the first bowel movement.  e. You will often notice bleeding with bowel movements.  This should slow down by the end of the first week of surgery f. Expect some drainage.  This should slow down, too, by the end of the first week of surgery.  Wear an absorbent pad or soft cotton gauze in your underwear until the drainage stops. 6. ACTIVITIES as tolerated:   a. You may resume regular (light) daily activities beginning the next day-such  as daily self-care, walking, climbing stairs-gradually increasing activities as tolerated.  If you can walk 30 minutes without difficulty, it is safe to try more intense activity such as jogging, treadmill, bicycling, low-impact aerobics, swimming, etc. b. Save the most intensive and strenuous activity for last such as sit-ups, heavy lifting, contact sports, etc  Refrain from any heavy lifting or straining until you are off narcotics for pain control.   c. DO NOT PUSH THROUGH PAIN.  Let pain be your guide: If it hurts to do something, don't do it.  Pain is your body warning you to avoid that activity for another week until the pain goes down. d. You may drive when you are no longer taking prescription pain medication, you can comfortably sit for long periods of time, and you can safely maneuver your car and apply brakes. e. Dennis Bast may have sexual intercourse when it is comfortable.  7. FOLLOW UP in our office a. Please call [facility] at [phone] to set up an appointment to see your surgeon in the office for a follow-up appointment approximately 2 weeks after your surgery. b. Make sure that you call for this appointment the day you arrive home to insure a convenient appointment time. 10. IF YOU HAVE DISABILITY OR FAMILY LEAVE FORMS, BRING THEM TO THE OFFICE FOR PROCESSING.  DO NOT GIVE THEM TO YOUR DOCTOR.        WHEN TO CALL us [phone]: 1. Poor pain control 2. Reactions / problems with new medications (rash/itching, nausea, etc)  3. Fever over 101.5 F (38.5 C) 4. Inability to urinate 5. Nausea and/or vomiting 6. Worsening swelling or bruising 7. Continued bleeding from incision.  8. Increased pain, redness, or drainage from the incision  The clinic staff is available to answer your questions during regular business hours (8:30am-5pm).  Please don't hesitate to call and ask to speak to one of our nurses for clinical concerns.   A surgeon from [facility] is always on call at  the hospitals   If you have a medical emergency, go to the nearest emergency room or call 911.    [facility], [place], [place], [place], [place]  [id number] ? MAIN: [phone] ? TOLL FREE: [phone] ? FAX [phone] [email or web address]  GETTING TO GOOD BOWEL HEALTH. Irregular bowel habits such as constipation and diarrhea can lead to many problems over time.  Having one soft bowel movement a day is the most important way to prevent further problems.  The anorectal canal is designed to handle stretching and feces to safely manage our ability to get rid of solid waste (feces, poop, stool) out of our body.  BUT, hard constipated stools can act like ripping concrete bricks and diarrhea can be a burning fire to this very sensitive area of our body, causing inflamed hemorrhoids, anal fissures, increasing risk is perirectal abscesses, abdominal pain/bloating, an making irritable bowel worse.     The goal: ONE SOFT BOWEL MOVEMENT A DAY!  To have soft, regular bowel movements:    Drink at least 8 tall glasses of water a day.     Take plenty of fiber.  Fiber is the undigested part of plant food that passes into the colon, acting s "natures broom" to encourage bowel motility and movement.  Fiber can absorb and hold large amounts of water. This results in a larger, bulkier stool, which is soft and easier to pass. Work gradually over several weeks up to 6 servings a day of fiber (25g a day even more if needed) in the form of: o Vegetables -- Root (potatoes, carrots, turnips), leafy green (lettuce, salad greens, celery, spinach), or cooked high residue (cabbage, broccoli, etc) o Fruit -- Fresh (unpeeled skin & pulp), Dried (prunes, apricots, cherries, etc ),  or stewed ( applesauce)  o Whole grain breads, pasta, etc (whole wheat)  o Bran cereals    Bulking Agents -- This type of water-retaining fiber generally is easily obtained each day by one of the  following:  o Psyllium bran -- The psyllium plant is remarkable because its ground seeds can retain so much water. This product is available as Metamucil, Konsyl, Effersyllium, Per Diem Fiber, or the less expensive generic preparation in drug and health food stores. Although labeled a laxative, it really is not a laxative.  o Methylcellulose -- This is another fiber derived from wood which also retains water. It is available as Citrucel. o Polyethylene Glycol - and "artificial" fiber commonly called Miralax or Glycolax.  It is helpful for people with gassy or bloated feelings with regular fiber o Flax Seed - a less gassy fiber than psyllium   No reading or other relaxing activity while on the toilet. If bowel movements take longer than 5 minutes, you are too constipated   AVOID CONSTIPATION.  High fiber and water intake usually takes care of this.  Sometimes a laxative is needed to stimulate more frequent bowel movements, but    Laxatives are not a good long-term solution as it can wear the colon out. o Osmotics (Milk of Magnesia, Fleets phosphosoda, Magnesium citrate, MiraLax, GoLytely) are safer than  o Stimulants (Senokot, Castor Oil,  Dulcolax, Ex Lax)    o Do not take laxatives for more than 7days in a row.    IF SEVERELY CONSTIPATED, try a Bowel Retraining Program: o Do not use laxatives.  o Eat a diet high in roughage, such as bran cereals and leafy vegetables.  o Drink six (6) ounces of prune or apricot juice each morning.  o Eat two (2) large servings of stewed fruit each day.  o Take one (1) heaping tablespoon of a psyllium-based bulking agent twice a day. Use sugar-free sweetener when possible to avoid excessive calories.  o Eat a normal breakfast.  o Set aside 15 minutes after breakfast to sit on the toilet, but do not strain to have a bowel movement.  o If you do not have a bowel movement by the third day, use an enema and repeat the above steps.    Controlling diarrhea o Switch to  liquids and simpler foods for a few days to avoid stressing your intestines further. o Avoid dairy products (especially milk & ice cream) for a short time.  The intestines often can lose the ability to digest lactose when stressed. o Avoid foods that cause gassiness or bloating.  Typical foods include beans and other legumes, cabbage, broccoli, and dairy foods.  Every person has some sensitivity to other foods, so listen to our body and avoid those foods that trigger problems for you. o Adding fiber (Citrucel, Metamucil, psyllium, Miralax) gradually can help thicken stools by absorbing excess fluid and retrain the intestines to act more normally.  Slowly increase the dose over a few weeks.  Too much fiber too soon can backfire and cause cramping & bloating. o Probiotics (such as active yogurt, Align, etc) may help repopulate the intestines and colon with normal bacteria and calm down a sensitive digestive tract.  Most studies show it to be of mild help, though, and such products can be costly. o Medicines:   Bismuth subsalicylate (ex. Kayopectate, Pepto Bismol) every 30 minutes for up to 6 doses can help control diarrhea.  Avoid if pregnant.   Loperamide (Immodium) can slow down diarrhea.  Start with two tablets (92m total) first and then try one tablet every 6 hours.  Avoid if you are having fevers or severe pain.  If you are not better or start feeling worse, stop all medicines and call your doctor for advice o Call your doctor if you are getting worse or not better.  Sometimes further testing (cultures, endoscopy, X-ray studies, bloodwork, etc) may be needed to help diagnose and treat the cause of the diarrhea.  Coronary Angiography With Stent, Care After Refer to this sheet in the next few weeks. These instructions provide you with information on caring for yourself after your procedure. Your health care provider may also give you more specific instructions. Your treatment has been planned according  to current medical practices, but problems sometimes occur. Call your health care provider if you have any problems or questions after your procedure.  WHAT TO EXPECT AFTER THE PROCEDURE  The insertion site may be tender for a few days after your procedure. HOME CARE INSTRUCTIONS   Only take over-the-counter or prescription medicines as directed by your health care provider. Take all medicines exactly as instructed. Blood thinners may be prescribed after your procedure to improve blood flow through the stent.  Change any bandages (dressings) as directed by your health care provider.   Check your insertion site every day for redness, swelling, or fluid leaking from  the insertion.   You may take showers. Pat the insertion area dry. Do not rub the insertion area with a washcloth or towel.   Eat a heart-healthy diet. This should include plenty of fresh fruits and vegetables. Meat should be lean cuts. Avoid the following types of food:   Food that is high in salt.   Canned or highly processed food.   Food that is high in saturated fat or sugar.   Fried food.   Make any other lifestyle changes recommended by your health care provider. This may include:   Quitting smoking.   Managing your weight.   Getting regular exercise.   Managing your blood pressure.   Limiting your alcohol intake.   Managing other health problems, such as diabetes.   If you need an MRI after your heart stent was placed, be sure to tell the health care provider who orders the MRI that you have a heart stent.   Follow up with your health care provider as directed.  SEEK IMMEDIATE MEDICAL CARE IF:   You develop chest pain, shortness of breath, feel faint, or pass out.  You have bleeding, swelling larger than a walnut, or drainage from the catheter insertion site.  You develop pain, discoloration, coldness, or severe bruising in the leg or arm that held the catheter.  You develop bleeding  from any other place such as from the bowels. There may be bright red blood in the urine or stools, or it may appear as black, tarry stools.  You have a fever or chills. MAKE SURE YOU:  Understand these instructions.  Will watch your condition.  Will get help right away if you are not doing well or get worse. Document Released: 07/05/2005 Document Revised: 08/18/2013 Document Reviewed: 05/19/2013 [facility] Patient Information 2014 [place].   Exercise to Stay Healthy Exercise helps you become and stay healthy. EXERCISE IDEAS AND TIPS Choose exercises that:  You enjoy.  Fit into your day. You do not need to exercise really hard to be healthy. You can do exercises at a slow or medium level and stay healthy. You can:  Stretch before and after working out.  Try yoga, Pilates, or tai chi.  Lift weights.  Walk fast, swim, jog, run, climb stairs, bicycle, dance, or rollerskate.  Take aerobic classes. Exercises that burn about 150 calories:  Running 1  miles in 15 minutes.  Playing volleyball for 45 to 60 minutes.  Washing and waxing a car for 45 to 60 minutes.  Playing touch football for 45 minutes.  Walking 1  miles in 35 minutes.  Pushing a stroller 1  miles in 30 minutes.  Playing basketball for 30 minutes.  Raking leaves for 30 minutes.  Bicycling 5 miles in 30 minutes.  Walking 2 miles in 30 minutes.  Dancing for 30 minutes.  Shoveling snow for 15 minutes.  Swimming laps for 20 minutes.  Walking up stairs for 15 minutes.  Bicycling 4 miles in 15 minutes.  Gardening for 30 to 45 minutes.  Jumping rope for 15 minutes.  Washing windows or floors for 45 to 60 minutes. Document Released: 01/18/2011 Document Revised: 03/09/2012 Document Reviewed: 01/18/2011 [facility] Patient Information 2014 [place], [place].

## 2014-02-18 DIAGNOSIS — Z86010 Personal history of colonic polyps: Secondary | ICD-10-CM | POA: Diagnosis not present

## 2014-03-10 ENCOUNTER — Inpatient Hospital Stay (HOSPITAL_COMMUNITY)
Admission: EM | Admit: 2014-03-10 | Discharge: 2014-03-13 | DRG: 394 | Disposition: A | Discharge status: Home / Self Care | Payer: Medicare Other | Attending: Internal Medicine | Admitting: Internal Medicine

## 2014-03-10 ENCOUNTER — Encounter (HOSPITAL_COMMUNITY): Payer: Self-pay | Admitting: Emergency Medicine

## 2014-03-10 DIAGNOSIS — K644 Residual hemorrhoidal skin tags: Secondary | ICD-10-CM | POA: Diagnosis not present

## 2014-03-10 DIAGNOSIS — Z9861 Coronary angioplasty status: Secondary | ICD-10-CM

## 2014-03-10 DIAGNOSIS — Z794 Long term (current) use of insulin: Secondary | ICD-10-CM

## 2014-03-10 DIAGNOSIS — K5730 Diverticulosis of large intestine without perforation or abscess without bleeding: Secondary | ICD-10-CM | POA: Diagnosis present

## 2014-03-10 DIAGNOSIS — K449 Diaphragmatic hernia without obstruction or gangrene: Secondary | ICD-10-CM | POA: Diagnosis present

## 2014-03-10 DIAGNOSIS — Z833 Family history of diabetes mellitus: Secondary | ICD-10-CM

## 2014-03-10 DIAGNOSIS — I10 Essential (primary) hypertension: Secondary | ICD-10-CM | POA: Diagnosis present

## 2014-03-10 DIAGNOSIS — E785 Hyperlipidemia, unspecified: Secondary | ICD-10-CM | POA: Diagnosis not present

## 2014-03-10 DIAGNOSIS — K219 Gastro-esophageal reflux disease without esophagitis: Secondary | ICD-10-CM | POA: Diagnosis present

## 2014-03-10 DIAGNOSIS — I2510 Atherosclerotic heart disease of native coronary artery without angina pectoris: Secondary | ICD-10-CM | POA: Diagnosis present

## 2014-03-10 DIAGNOSIS — K922 Gastrointestinal hemorrhage, unspecified: Secondary | ICD-10-CM | POA: Diagnosis not present

## 2014-03-10 DIAGNOSIS — D62 Acute posthemorrhagic anemia: Secondary | ICD-10-CM | POA: Diagnosis present

## 2014-03-10 DIAGNOSIS — Z79899 Other long term (current) drug therapy: Secondary | ICD-10-CM

## 2014-03-10 DIAGNOSIS — D509 Iron deficiency anemia, unspecified: Secondary | ICD-10-CM | POA: Diagnosis not present

## 2014-03-10 DIAGNOSIS — Z8249 Family history of ischemic heart disease and other diseases of the circulatory system: Secondary | ICD-10-CM

## 2014-03-10 DIAGNOSIS — I959 Hypotension, unspecified: Secondary | ICD-10-CM | POA: Diagnosis present

## 2014-03-10 DIAGNOSIS — Z860100 Personal history of colon polyps, unspecified: Secondary | ICD-10-CM

## 2014-03-10 DIAGNOSIS — Z87891 Personal history of nicotine dependence: Secondary | ICD-10-CM | POA: Diagnosis not present

## 2014-03-10 DIAGNOSIS — K921 Melena: Secondary | ICD-10-CM | POA: Diagnosis not present

## 2014-03-10 DIAGNOSIS — M19049 Primary osteoarthritis, unspecified hand: Secondary | ICD-10-CM | POA: Diagnosis present

## 2014-03-10 DIAGNOSIS — E119 Type 2 diabetes mellitus without complications: Secondary | ICD-10-CM | POA: Diagnosis not present

## 2014-03-10 DIAGNOSIS — Z7902 Long term (current) use of antithrombotics/antiplatelets: Secondary | ICD-10-CM | POA: Diagnosis not present

## 2014-03-10 DIAGNOSIS — K648 Other hemorrhoids: Secondary | ICD-10-CM | POA: Diagnosis not present

## 2014-03-10 HISTORY — DX: Post-traumatic stress disorder, unspecified: F43.10

## 2014-03-10 HISTORY — DX: Major depressive disorder, single episode, unspecified: F32.9

## 2014-03-10 LAB — COMPREHENSIVE METABOLIC PANEL
ALBUMIN: 4.2 g/dL (ref 3.5–5.2)
ALT: 31 U/L (ref 0–53)
AST: 29 U/L (ref 0–37)
Alkaline Phosphatase: 85 U/L (ref 39–117)
BILIRUBIN TOTAL: 0.7 mg/dL (ref 0.3–1.2)
BUN: 27 mg/dL — ABNORMAL HIGH (ref 6–23)
CO2: 22 meq/L (ref 19–32)
Calcium: 9.6 mg/dL (ref 8.4–10.5)
Chloride: 100 mEq/L (ref 96–112)
Creatinine, Ser: 1.5 mg/dL — ABNORMAL HIGH (ref 0.50–1.35)
GFR, EST AFRICAN AMERICAN: 53 mL/min — AB (ref >90)
GFR, EST NON AFRICAN AMERICAN: 46 mL/min — AB (ref >90)
Glucose, Bld: 169 mg/dL — ABNORMAL HIGH (ref 70–99)
Potassium: 4.1 mEq/L (ref 3.7–5.3)
Sodium: 139 mEq/L (ref 137–147)
Total Protein: 6.8 g/dL (ref 6.0–8.3)

## 2014-03-10 LAB — APTT: aPTT: 24 seconds (ref 24–37)

## 2014-03-10 LAB — CBC
HCT: 38.4 % — ABNORMAL LOW (ref 39.0–52.0)
Hemoglobin: 13.6 g/dL (ref 13.0–17.0)
MCH: 33.7 pg (ref 26.0–34.0)
MCHC: 35.4 g/dL (ref 30.0–36.0)
MCV: 95.3 fL (ref 78.0–100.0)
Platelets: 287 10*3/uL (ref 150–400)
RBC: 4.03 MIL/uL — ABNORMAL LOW (ref 4.22–5.81)
RDW: 12.3 % (ref 11.5–15.5)
WBC: 10 10*3/uL (ref 4.0–10.5)

## 2014-03-10 LAB — TROPONIN I: Troponin I: <0.3 ng/mL (ref <0.30)

## 2014-03-10 LAB — POC OCCULT BLOOD, ED: Fecal Occult Bld: POSITIVE — AB

## 2014-03-10 LAB — TYPE AND SCREEN
ABO/RH(D): O POS
ANTIBODY SCREEN: NEGATIVE

## 2014-03-10 LAB — PROTIME-INR
INR: 1.01 (ref 0.00–1.49)
Prothrombin Time: 13.1 seconds (ref 11.6–15.2)

## 2014-03-10 LAB — CBG MONITORING, ED: GLUCOSE-CAPILLARY: 107 mg/dL — AB (ref 70–99)

## 2014-03-10 MED ORDER — SODIUM CHLORIDE 0.9 % IV BOLUS (SEPSIS)
1000.0000 mL | Freq: Once | INTRAVENOUS | Status: AC
  Administered 2014-03-10: 1000 mL via INTRAVENOUS

## 2014-03-10 MED ORDER — DIPHENHYDRAMINE HCL 25 MG PO TABS: 25.0000 mg | ORAL_TABLET | Freq: Four times a day (QID) | ORAL | Status: DC | PRN

## 2014-03-10 MED ORDER — HYDROCODONE-ACETAMINOPHEN 5-325 MG PO TABS
1.0000 | ORAL_TABLET | ORAL | Status: DC | PRN
  Administered 2014-03-12: 1 via ORAL
  Filled 2014-03-10: qty 1

## 2014-03-10 MED ORDER — NITROGLYCERIN 0.4 MG SL SUBL: 0.4000 mg | SUBLINGUAL_TABLET | SUBLINGUAL | Status: DC | PRN

## 2014-03-10 MED ORDER — METHOCARBAMOL 500 MG PO TABS
500.0000 mg | ORAL_TABLET | Freq: Three times a day (TID) | ORAL | Status: DC | PRN
  Filled 2014-03-10: qty 1

## 2014-03-10 MED ORDER — ONDANSETRON HCL 4 MG/2ML IJ SOLN: 4.0000 mg | Freq: Four times a day (QID) | INTRAMUSCULAR | Status: DC | PRN

## 2014-03-10 MED ORDER — ATORVASTATIN CALCIUM 80 MG PO TABS
80.0000 mg | ORAL_TABLET | Freq: Every day | ORAL | Status: DC
  Administered 2014-03-10 – 2014-03-13 (×4): 80 mg via ORAL
  Filled 2014-03-10 (×4): qty 1

## 2014-03-10 MED ORDER — SODIUM CHLORIDE 0.9 % IV SOLN
INTRAVENOUS | Status: DC
  Administered 2014-03-10: 17:00:00 via INTRAVENOUS
  Administered 2014-03-11: 500 mL via INTRAVENOUS
  Administered 2014-03-12 – 2014-03-13 (×2): via INTRAVENOUS

## 2014-03-10 MED ORDER — INSULIN ASPART 100 UNIT/ML ~~LOC~~ SOLN: 0.0000 [IU] | Freq: Every day | SUBCUTANEOUS | Status: DC

## 2014-03-10 MED ORDER — INSULIN ASPART 100 UNIT/ML ~~LOC~~ SOLN
0.0000 [IU] | Freq: Three times a day (TID) | SUBCUTANEOUS | Status: DC
  Administered 2014-03-11: 5 [IU] via SUBCUTANEOUS
  Administered 2014-03-12: 2 [IU] via SUBCUTANEOUS

## 2014-03-10 MED ORDER — METOPROLOL SUCCINATE ER 100 MG PO TB24
100.0000 mg | ORAL_TABLET | Freq: Every day | ORAL | Status: DC
  Administered 2014-03-11 – 2014-03-13 (×3): 100 mg via ORAL
  Filled 2014-03-10 (×4): qty 1

## 2014-03-10 MED ORDER — LOSARTAN POTASSIUM 50 MG PO TABS: 100.0000 mg | ORAL_TABLET | Freq: Every morning | ORAL | Status: DC

## 2014-03-10 MED ORDER — FAMOTIDINE 20 MG PO TABS
20.0000 mg | ORAL_TABLET | Freq: Two times a day (BID) | ORAL | Status: DC
  Administered 2014-03-10 – 2014-03-13 (×6): 20 mg via ORAL
  Filled 2014-03-10 (×7): qty 1

## 2014-03-10 MED ORDER — LOSARTAN POTASSIUM 50 MG PO TABS
100.0000 mg | ORAL_TABLET | Freq: Every day | ORAL | Status: DC
  Administered 2014-03-11 – 2014-03-13 (×3): 100 mg via ORAL
  Filled 2014-03-10 (×3): qty 2

## 2014-03-10 MED ORDER — SODIUM CHLORIDE 0.9 % IJ SOLN
3.0000 mL | Freq: Two times a day (BID) | INTRAMUSCULAR | Status: DC
  Administered 2014-03-10 – 2014-03-13 (×4): 3 mL via INTRAVENOUS

## 2014-03-10 MED ORDER — DIPHENHYDRAMINE HCL 25 MG PO CAPS: 25.0000 mg | ORAL_CAPSULE | Freq: Four times a day (QID) | ORAL | Status: DC | PRN

## 2014-03-10 MED ORDER — MORPHINE SULFATE 2 MG/ML IJ SOLN
1.0000 mg | INTRAMUSCULAR | Status: DC | PRN
  Administered 2014-03-12: 1 mg via INTRAVENOUS
  Filled 2014-03-10: qty 1

## 2014-03-10 MED ORDER — ONDANSETRON HCL 4 MG PO TABS: 4.0000 mg | ORAL_TABLET | Freq: Four times a day (QID) | ORAL | Status: DC | PRN

## 2014-03-10 MED ORDER — SODIUM CHLORIDE 0.9 % IV SOLN
INTRAVENOUS | Status: DC
  Administered 2014-03-10: 22:00:00 via INTRAVENOUS

## 2014-03-10 NOTE — ED Notes (Signed)
Upon pt's arrival to room, placed pt on stretcher and in gown. RN and PA student at bedside. Pt passed out while in bed. Placed nonrebreather on pt. MD came to bedside and pt now alert and oriented. Obtained IV and started NS bolus.

## 2014-03-10 NOTE — ED Notes (Signed)
Pt had one bloody stool while in room. Stool had numerous blood clots

## 2014-03-10 NOTE — H&P (Signed)
Physician Admission History and Physical     PCP:   Velna Hatchet, MD   Chief Complaint:  GI bleed   HPI: Patient w/ PMHx of CAD, HLD, HTN, DM, HCV s/p treatment who presents w/ a lower GI bleed. He has had gross red blood per rectum x 5 today and x 2 in ED. Of note, he recently had banding of his hemorrhoids by [facility] in Feb. His cardiac hx involves angioplasty in 2014 for LAD, RCA and circ coronaries and more recently underwent cath in 01/11/14 where he had DES to the RCA , LAD. He is currently on effient as antiplatelet therapy. His cardiologist is Dr Einar Gip. His last colonoscopy was Jan 2010 that showed 2 hyperplastic polyps and f/u in 2015 was recommended. This was done by [facility].   Beyond this painless bleed, he has no other complaints. He did have brief period of syncope in the ED but recovered quickly. Vitals currently stable. Hgb stable.    Review of Systems:  Neg except as noted above  Past History Past Medical History (reviewed - no changes required): CAD - s/p 1 year effient // HLD w/ High TG //  HTN // controlled type 2 DM     - 05/10/11 - nuclear stress - EKG changes present     - 05/21/11 - cath : 90% prox RCA, prox circ 80%, LAD 90% stenosis --- s/p PCI as stated below  arthritis (hands, lower back and L hip)  GERD , hiatal hernia , moderately controlled  HCV hx, s/p treatment in 1980s  DDD of L spine  hx of tob abuse   ( Dr Nadyne Coombes * cardiology )( Dr Rex Kras * prior PCP in [place] ) ( [place] in [place], eye exams, etc ) ( Dr Rolena Infante * [facility] ) ( Dr Nelva Bush * ortho/spine )  Surgical History (reviewed - no changes required): Sept 2014 - fusion L5-S1 - Dr Rolena Infante  May 2012 - DES to LAD and nonDES to RCA  L rotator cuff 1991 R elbow 1999 R hand 2011 eyelid surgery 2010 appy  hernia repair  Family History (reviewed - no changes required): cad - father (fatal MI at 82), brother has multiple stents  DM - mothers side of family   Social History (reviewed - no changes  required): married w/ 3 children. lives in [place]. veteran 2 yrs [place] and 3 yrs [place] and then [employer].  tob: quit 1994. ~ 60 pack years  etoh: 2-3 shots of crowne royal daily  drugs: none    Complete Medication List: 2)  Effient 10 Mg Oral Tabs (Prasugrel hcl) .... Take 1 tab daily 3)  Naproxen 500 Mg Tabs (Naproxen) .... Take 1 tab twice daily as needed 4)  Hydrocortisone Acetate 1 % Crea (Hydrocortisone acetate) .... Apply to affected area bid as needed 5)  Epipen 2-pak 0.3 Mg/0.39ml Soaj (Epinephrine) .... Use as needed for anyphalaxis 7)  Tramadol Hcl 50 Mg Tabs (Tramadol hcl) .... Take 1 tab as needed q4-6 hours prn pain 8)  Cyclobenzaprine Hcl 10 Mg Tabs (Cyclobenzaprine hcl) .... Take 1 tablet by mouth three times a day as needed for back pain 9)  Metformin Hcl 500 Mg Tabs (Metformin hcl) .... Take 1 tablet by mouth twice a day 10)  Hydrochlorothiazide 25 Mg Tabs (Hydrochlorothiazide) .... Take 1 tablet by mouth every day 11)  Cialis 20 Mg Tabs (Tadalafil) .... Take as needed 12)  Vitamin D3 2000 Unit Caps (Cholecalciferol) .... Take 2 capsules  by mouth every day 13)  Atorvastatin Calcium 80 Mg Tabs (Atorvastatin calcium) .... Take 1 tab qhs 14)  Nitrostat 0.4 Mg Subl (Nitroglycerin) .... Take 1 tab as needed for cp 15)  Metoprolol Succinate Er 100 Mg Xr24h-tab (Metoprolol succinate) .... Take 1 tablet by mouth every day 16)  Losartan Potassium 100 Mg Tabs (Losartan potassium) .... Take 1 tab daily 17)  Aspirin 81 Mg Oral Tabs (Aspirin) .... Take 1 tab daily         Physical Exam: Filed Vitals:   03/10/14 1248 03/10/14 1315 03/10/14 1345 03/10/14 1500  BP: 128/68 110/68 102/42 111/52  Pulse: 72 59 55 58  Temp: 97.7 F (36.5 C)     TempSrc: Oral     Resp: 15 11 16 12   Weight: 97.07 kg (214 lb)     SpO2: 96% 100% 100% 100%   Gen: WM in NAD  HEENT: trachea midline, MMM, anicteric  Lungs:CTAB, no wheezes/rales  Cardio: RRR, no mRG  Abd: soft, NT, ND  MSK: no  focal deficits  Neuro:  No focal deficits  Skin: warm dry     Labs on Admission:   Recent Labs  03/10/14 1310  NA 139  K 4.1  CL 100  CO2 22  GLUCOSE 169*  BUN 27*  CREATININE 1.50*  CALCIUM 9.6    Recent Labs  03/10/14 1310  AST 29  ALT 31  ALKPHOS 85  BILITOT 0.7  PROT 6.8  ALBUMIN 4.2   No results found for this basename: LIPASE, AMYLASE,  in the last 72 hours  Recent Labs  03/10/14 1310  WBC 10.0  HGB 13.6  HCT 38.4*  MCV 95.3  PLT 287    Recent Labs  03/10/14 1310  TROPONINI <0.30   Lab Results  Component Value Date   INR 1.01 03/10/2014   INR 0.87 01/11/2014   No results found for this basename: TSH, T4TOTAL, FREET3, T3FREE, THYROIDAB,  in the last 72 hours No results found for this basename: VITAMINB12, FOLATE, FERRITIN, TIBC, IRON, RETICCTPCT,  in the last 72 hours  Radiological Exams on Admission: No results found. Orders placed during the hospital encounter of 03/10/14  . EKG 12-LEAD  . EKG 12-LEAD    Assessment/Plan GI bleed - NPO while GI works up patient, Dr Paulita Fujita has been consulted. xfuse prn for goal Hgb of 8. hold effient while actively bleeding, Dr Einar Gip has been consulted. does have hx of hemorrhoidal bleed that was banded by [facility] in Feb. Will await GI recs for further plan. Recheck CBC in AM   CAD s/p DES in Jan 2015 - holding effient + ASA in setting of acute GI bleed . Dr Einar Gip has been consulted for further recs. Likely will resume plavix + ASA on d/c .   HTN - home meds except holding HCTZ while we hydrate in setting of hypotension w/ GI bleed   DM - holding home metformin while in house. SSI   HLD - home meds    hemorrhoids - steroid cream prn   PPx - SCDs while acute bleed   Kimberlea Schlag 03/10/2014, 3:20 PM

## 2014-03-10 NOTE — ED Provider Notes (Signed)
CSN: [id number]     Arrival date & time 03/10/14  1244 History   First MD Initiated Contact with Patient 03/10/14 1257     Chief Complaint  Patient presents with  . Rectal Bleeding     (Consider location/radiation/quality/duration/timing/severity/associated sxs/prior Treatment) HPI Comments: Patient is a 68 year old male who presents with complaints of multiple episodes of bloody bowel movements. He recently had a stent placed and has been put on Effient for this. He denies any fever or pain. He has seen Dr. Amedeo Plenty in the past but has not had a colonoscopy.  Patient is a 68 y.o. male presenting with hematochezia. The history is provided by the patient.  Rectal Bleeding Quality:  Bright red Amount:  Moderate Duration:  6 hours Timing:  Intermittent Progression:  Worsening Chronicity:  New Context: hemorrhoids   Context: not anal fissures   Relieved by:  Nothing Worsened by:  Nothing tried Ineffective treatments:  None tried   Past Medical History  Diagnosis Date  . Anginal pain     occ  . Coronary artery disease 11    stents x5  . Hypertension   . Pneumonia     hx  . GERD (gastroesophageal reflux disease)     zantac  . H/O hiatal hernia   . Hepatitis 70's    food   . Diabetes mellitus without complication     TYPE 2  . Bursitis of left hip    Past Surgical History  Procedure Laterality Date  . Coronary artery bypass graft  10  . Elbow arthroscopy Right 89    ulner nerve rel  . Shoulder arthroscopy Left     release of tendon  . Thrumb Right   . Hernia repair Right 50  . Anterior fusion lumbar spine      L5 S1    07/07/2013      Dr Rolena Infante  . Anterior lumbar fusion N/A 07/07/2013    Procedure: ANTERIOR LUMBAR FUSION 1 LEVEL ALIF L5-S1;  Surgeon: Melina Schools, MD;  Location: [facility];  Service: Orthopedics;  Laterality: N/A;  . Abdominal exposure N/A 07/07/2013    Procedure: ABDOMINAL EXPOSURE FOR ANTERIOR LUMBAR FUSION;  Surgeon: Rosetta Posner, MD;  Location: [facility] OR;   Service: Vascular;  Laterality: N/A;  . Coronary angioplasty  01/11/2014    LAD & CIRCUMFLEX   Family History  Problem Relation Age of Onset  . Heart disease Father    History  Substance Use Topics  . Smoking status: Former Smoker -- 1.50 packs/day for 23 years    Types: Cigarettes    Quit date: 07/06/1983  . Smokeless tobacco: Never Used  . Alcohol Use: 12.6 oz/week    21 Shots of liquor per week    Review of Systems  Gastrointestinal: Positive for hematochezia.  All other systems reviewed and are negative.      Allergies  Lisinopril  Home Medications   Current Outpatient Rx  Name  Route  Sig  Dispense  Refill  . aspirin 81 MG tablet   Oral   Take 4 tablets (325 mg total) by mouth daily.         Marland Kitchen atorvastatin (LIPITOR) 80 MG tablet   Oral   Take 80 mg by mouth daily.         . diphenhydrAMINE (BENADRYL) 25 MG tablet   Oral   Take 25 mg by mouth every 6 (six) hours as needed for allergies.         Marland Kitchen EPINEPHrine (  EPIPEN) 0.3 mg/0.3 mL SOAJ   Intramuscular   Inject 0.3 mLs (0.3 mg total) into the muscle once.   1 Device   1   . famotidine (PEPCID) 20 MG tablet   Oral   Take 1 tablet (20 mg total) by mouth 2 (two) times daily.   30 tablet   0   . hydrochlorothiazide (HYDRODIURIL) 25 MG tablet   Oral   Take 25 mg by mouth every morning.          Marland Kitchen losartan (COZAAR) 100 MG tablet   Oral   Take 100 mg by mouth every morning.          . metFORMIN (GLUCOPHAGE) 500 MG tablet   Oral   Take 500 mg by mouth 2 (two) times daily with a meal.         . methocarbamol (ROBAXIN) 500 MG tablet   Oral   Take 500 mg by mouth 3 (three) times daily as needed (pain).         . metoprolol succinate (TOPROL-XL) 100 MG 24 hr tablet   Oral   Take 100 mg by mouth daily. Take with or immediately following a meal.         . nitroGLYCERIN (NITROSTAT) 0.4 MG SL tablet   Sublingual   Place 0.4 mg under the tongue every 5 (five) minutes as needed for chest  pain.         . prasugrel (EFFIENT) 10 MG TABS tablet   Oral   Take 1 tablet (10 mg total) by mouth daily.   30 tablet   0     Will sent Rx for 11 more refills for stents once c ...    BP 111/52  Pulse 58  Temp(Src) 97.7 F (36.5 C) (Oral)  Resp 12  Wt 214 lb (97.07 kg)  SpO2 100% Physical Exam  Nursing note and vitals reviewed. Constitutional: He is oriented to person, place, and time. He appears well-developed and well-nourished. No distress.  HENT:  Head: Normocephalic and atraumatic.  Mouth/Throat: Oropharynx is clear and moist.  Neck: Normal range of motion. Neck supple.  Cardiovascular: Normal rate, regular rhythm and normal heart sounds.   No murmur heard. Pulmonary/Chest: Effort normal and breath sounds normal. No respiratory distress. He has no wheezes.  Abdominal: Soft. Bowel sounds are normal. He exhibits no distension. There is no tenderness.  Musculoskeletal: Normal range of motion. He exhibits no edema.  Neurological: He is alert and oriented to person, place, and time.  Skin: Skin is warm and dry. He is not diaphoretic.    ED Course  Procedures (including critical care time) Labs Review Labs Reviewed  CBC - Abnormal; Notable for the following:    RBC 4.03 (*)    HCT 38.4 (*)    All other components within normal limits  COMPREHENSIVE METABOLIC PANEL - Abnormal; Notable for the following:    Glucose, Bld 169 (*)    BUN 27 (*)    Creatinine, Ser 1.50 (*)    GFR calc non Af Amer 46 (*)    GFR calc Af Amer 53 (*)    All other components within normal limits  POC OCCULT BLOOD, ED - Abnormal; Notable for the following:    Fecal Occult Bld POSITIVE (*)    All other components within normal limits  PROTIME-INR  APTT  TROPONIN I  TYPE AND SCREEN   Imaging Review No results found.   EKG Interpretation   Date/Time:  Thursday March 10 2014 13:15:33 EDT Ventricular Rate:  62 PR Interval:  160 QRS Duration: 98 QT Interval:  422 QTC Calculation:  428 R Axis:   58 Text Interpretation:  Sinus rhythm Minimal ST depression, inferior leads  Baseline wander in lead(s) II III aVF Confirmed by DELOS  MD, Delfina Schreurs  [id number]) on 03/10/2014 3:29:35 PM       MDM   Final diagnoses:  None       patient is a 68 year old male who presents with complaints of GI bleeding since this morning.  He has had several episodes of this. shortly after being placed in the exam room he became suddenly hypotensive and near syncopal. His blood pressure dropped and he became diaphoretic. He was evaluated immediately by me and IV fluids were started and labs were obtained. I've spoken with Dr. Paulita Fujita from gastroenterology who agrees the patient he be admitted to his primary Dr. Dr. Ardeth Perfect agrees to admit.    Veryl Speak, MD 03/10/14 [phone]

## 2014-03-10 NOTE — ED Notes (Addendum)
Verbalized to patient that he will have a bed on [place].

## 2014-03-10 NOTE — ED Notes (Signed)
He states hes had 5 bright red bloody bowel movements since this am, filled the toilet bowl with blood. He had hemorrhoid banding in February. He denies any pain.

## 2014-03-11 ENCOUNTER — Encounter (HOSPITAL_COMMUNITY): Payer: Self-pay

## 2014-03-11 ENCOUNTER — Encounter (HOSPITAL_COMMUNITY)
Admission: EM | Disposition: A | Discharge status: Home / Self Care | Payer: Self-pay | Source: Home / Self Care | Attending: Internal Medicine

## 2014-03-11 HISTORY — PX: ESOPHAGOGASTRODUODENOSCOPY: SHX5428

## 2014-03-11 LAB — CBC
HCT: 30.1 % — ABNORMAL LOW (ref 39.0–52.0)
HEMATOCRIT: 29.5 % — AB (ref 39.0–52.0)
Hemoglobin: 10.4 g/dL — ABNORMAL LOW (ref 13.0–17.0)
Hemoglobin: 10.5 g/dL — ABNORMAL LOW (ref 13.0–17.0)
MCH: 33.8 pg (ref 26.0–34.0)
MCH: 33.5 pg (ref 26.0–34.0)
MCHC: 35.3 g/dL (ref 30.0–36.0)
MCHC: 34.9 g/dL (ref 30.0–36.0)
MCV: 95.8 fL (ref 78.0–100.0)
MCV: 96.2 fL (ref 78.0–100.0)
PLATELETS: 198 10*3/uL (ref 150–400)
PLATELETS: 198 10*3/uL (ref 150–400)
RBC: 3.08 MIL/uL — ABNORMAL LOW (ref 4.22–5.81)
RBC: 3.13 MIL/uL — ABNORMAL LOW (ref 4.22–5.81)
RDW: 12.6 % (ref 11.5–15.5)
RDW: 12.7 % (ref 11.5–15.5)
WBC: 6.4 10*3/uL (ref 4.0–10.5)
WBC: 6.2 10*3/uL (ref 4.0–10.5)

## 2014-03-11 LAB — BASIC METABOLIC PANEL
BUN: 22 mg/dL (ref 6–23)
CALCIUM: 8.4 mg/dL (ref 8.4–10.5)
CHLORIDE: 105 meq/L (ref 96–112)
CO2: 22 mEq/L (ref 19–32)
Creatinine, Ser: 1.2 mg/dL (ref 0.50–1.35)
GFR, EST AFRICAN AMERICAN: 70 mL/min — AB (ref >90)
GFR, EST NON AFRICAN AMERICAN: 60 mL/min — AB (ref >90)
Glucose, Bld: 99 mg/dL (ref 70–99)
Potassium: 3.7 mEq/L (ref 3.7–5.3)
Sodium: 141 mEq/L (ref 137–147)

## 2014-03-11 LAB — GLUCOSE, CAPILLARY
GLUCOSE-CAPILLARY: 103 mg/dL — AB (ref 70–99)
Glucose-Capillary: 97 mg/dL (ref 70–99)
Glucose-Capillary: 124 mg/dL — ABNORMAL HIGH (ref 70–99)
Glucose-Capillary: 153 mg/dL — ABNORMAL HIGH (ref 70–99)
Glucose-Capillary: 110 mg/dL — ABNORMAL HIGH (ref 70–99)

## 2014-03-11 LAB — MRSA PCR SCREENING: MRSA BY PCR: POSITIVE — AB

## 2014-03-11 SURGERY — EGD (ESOPHAGOGASTRODUODENOSCOPY)
Anesthesia: Moderate Sedation

## 2014-03-11 MED ORDER — CHLORHEXIDINE GLUCONATE CLOTH 2 % EX PADS
6.0000 | MEDICATED_PAD | Freq: Every day | CUTANEOUS | Status: DC
  Administered 2014-03-11 – 2014-03-13 (×3): 6 via TOPICAL

## 2014-03-11 MED ORDER — SODIUM CHLORIDE 0.9 % IV SOLN: INTRAVENOUS | Status: DC

## 2014-03-11 MED ORDER — MUPIROCIN 2 % EX OINT
1.0000 "application " | TOPICAL_OINTMENT | Freq: Two times a day (BID) | CUTANEOUS | Status: DC
  Administered 2014-03-11 – 2014-03-13 (×5): 1 via NASAL
  Filled 2014-03-11: qty 22

## 2014-03-11 MED ORDER — BUTAMBEN-TETRACAINE-BENZOCAINE 2-2-14 % EX AERO
INHALATION_SPRAY | CUTANEOUS | Status: DC | PRN
  Administered 2014-03-11: 2 via TOPICAL

## 2014-03-11 MED ORDER — FENTANYL CITRATE 0.05 MG/ML IJ SOLN
INTRAMUSCULAR | Status: AC
  Filled 2014-03-11: qty 2

## 2014-03-11 MED ORDER — MIDAZOLAM HCL 10 MG/2ML IJ SOLN
INTRAMUSCULAR | Status: DC | PRN
  Administered 2014-03-11 (×2): 2 mg via INTRAVENOUS

## 2014-03-11 MED ORDER — MIDAZOLAM HCL 5 MG/ML IJ SOLN
INTRAMUSCULAR | Status: AC
  Filled 2014-03-11: qty 2

## 2014-03-11 MED ORDER — FENTANYL CITRATE 0.05 MG/ML IJ SOLN
INTRAMUSCULAR | Status: DC | PRN
  Administered 2014-03-11 (×2): 25 ug via INTRAVENOUS

## 2014-03-11 MED ORDER — PEG 3350-KCL-NA BICARB-NACL 420 G PO SOLR
4000.0000 mL | Freq: Once | ORAL | Status: AC
  Administered 2014-03-11: 4000 mL via ORAL
  Filled 2014-03-11: qty 4000

## 2014-03-11 MED ORDER — SODIUM CHLORIDE 0.9 % IV SOLN
INTRAVENOUS | Status: DC
  Administered 2014-03-11: 20 mL/h via INTRAVENOUS

## 2014-03-11 MED ORDER — DIPHENHYDRAMINE HCL 50 MG/ML IJ SOLN
INTRAMUSCULAR | Status: AC
  Filled 2014-03-11: qty 1

## 2014-03-11 MED ORDER — ASPIRIN 325 MG PO TABS
325.0000 mg | ORAL_TABLET | Freq: Every day | ORAL | Status: DC
  Administered 2014-03-11: 325 mg via ORAL
  Filled 2014-03-11 (×2): qty 1

## 2014-03-11 NOTE — H&P (View-Only) (Signed)
[facility] CONSULT Reason for consult: G.I. bleeding Referring Physician: Dr. Cheral Almas is an 68 y.o. male.  HPI: is well-known to my partner Dr. Madilyn Fireman. He has had previous colonoscopy for the removal of colon polyps. Less colonoscopy 2010 with small hyperplastic polyps and hemorrhoids noted. Notable to know diverticulosis was noted in the op note. The patient was admitted with painless G.I. bleeding it was more hematochezia. He has been taking Naprosyn for per site is on chronic basis. He is not been in any as reducing medications. No melena or abdominal pain. Other issues have been cardiac stents last placed in January with the need to be on Effiient. Has had some rectal bleeding as an outpatient and is seen Dr. Michaell Cowing. At some point, hemorrhoidectomy was planned following preoperative colonoscopy. The plan was to wait 6 months after his last stent which was placed in January. Were asked to see the patient regarding this G.I. bleeding.  Past Medical History  Diagnosis Date  . Anginal pain     occ  . Coronary artery disease 10    stents x5  . Hypertension   . Pneumonia     hx  . GERD (gastroesophageal reflux disease)     zantac  . H/O hiatal hernia   . Hepatitis 70's    food   . Diabetes mellitus without complication     TYPE 2  . Bursitis of left hip   . Depression   . PTSD (post-traumatic stress disorder)     Past Surgical History  Procedure Laterality Date  . Elbow arthroscopy Right 89    ulner nerve rel  . Shoulder arthroscopy Left     release of tendon  . Thrumb Right   . Hernia repair Right 64  . Anterior fusion lumbar spine      L5 S1    07/07/2013      Dr Shon Baton  . Anterior lumbar fusion N/A 07/07/2013    Procedure: ANTERIOR LUMBAR FUSION 1 LEVEL ALIF L5-S1;  Surgeon: Venita Lick, MD;  Location: [facility] OR;  Service: Orthopedics;  Laterality: N/A;  . Abdominal exposure N/A 07/07/2013    Procedure: ABDOMINAL EXPOSURE FOR ANTERIOR LUMBAR FUSION;   Surgeon: Larina Earthly, MD;  Location: [facility] OR;  Service: Vascular;  Laterality: N/A;  . Coronary angioplasty  01/11/2014    LAD & CIRCUMFLEX    Family History  Problem Relation Age of Onset  . Heart disease Father     Social History:  reports that he quit smoking about 32 years ago. His smoking use included Cigarettes. He has a 34.5 pack-year smoking history. He has never used smokeless tobacco. He reports that he drinks about 12.6 ounces of alcohol per week. He reports that he does not use illicit drugs.  Allergies:  Allergies  Allergen Reactions  . Lisinopril     cough    Medications; Prior to Admission medications   Medication Sig Start Date End Date Taking? Authorizing Provider  aspirin 81 MG tablet Take 4 tablets (325 mg total) by mouth daily. 01/12/14  Yes Pamella Pert, MD  atorvastatin (LIPITOR) 80 MG tablet Take 80 mg by mouth daily.   Yes Historical Provider, MD  diphenhydrAMINE (BENADRYL) 25 MG tablet Take 25 mg by mouth every 6 (six) hours as needed for allergies. 07/24/13  Yes Olivia Mackie, MD  EPINEPHrine (EPIPEN) 0.3 mg/0.3 mL SOAJ Inject 0.3 mLs (0.3 mg total) into the muscle once. 07/24/13  Yes Olivia Mackie, MD  famotidine (PEPCID) 20 MG tablet Take 1 tablet (20 mg total) by mouth 2 (two) times daily. 07/24/13  Yes Kalman Drape, MD  hydrochlorothiazide (HYDRODIURIL) 25 MG tablet Take 25 mg by mouth every morning.    Yes Historical Provider, MD  losartan (COZAAR) 100 MG tablet Take 100 mg by mouth every morning.    Yes Historical Provider, MD  metFORMIN (GLUCOPHAGE) 500 MG tablet Take 500 mg by mouth 2 (two) times daily with a meal.   Yes Historical Provider, MD  methocarbamol (ROBAXIN) 500 MG tablet Take 500 mg by mouth 3 (three) times daily as needed (pain). 07/09/13  Yes Melina Schools, MD  metoprolol succinate (TOPROL-XL) 100 MG 24 hr tablet Take 100 mg by mouth daily. Take with or immediately following a meal.   Yes Historical Provider, MD  nitroGLYCERIN (NITROSTAT)  0.4 MG SL tablet Place 0.4 mg under the tongue every 5 (five) minutes as needed for chest pain.   Yes Historical Provider, MD  prasugrel (EFFIENT) 10 MG TABS tablet Take 1 tablet (10 mg total) by mouth daily. 01/12/14  Yes Laverda Page, MD   . atorvastatin  80 mg Oral Daily  . Chlorhexidine Gluconate Cloth  6 each Topical Q0600  . famotidine  20 mg Oral BID  . insulin aspart  0-15 Units Subcutaneous TID WC  . insulin aspart  0-5 Units Subcutaneous QHS  . losartan  100 mg Oral Daily  . metoprolol succinate  100 mg Oral Daily  . mupirocin ointment  1 application Nasal BID  . sodium chloride  3 mL Intravenous Q12H   PRN Meds diphenhydrAMINE, HYDROcodone-acetaminophen, methocarbamol, morphine injection, nitroGLYCERIN, ondansetron (ZOFRAN) IV, ondansetron Results for orders placed during the hospital encounter of 03/10/14 (from the past 48 hour(s))  CBC     Status: Abnormal   Collection Time    03/10/14  1:10 PM      Result Value Ref Range   WBC 10.0  4.0 - 10.5 K/uL   RBC 4.03 (*) 4.22 - 5.81 MIL/uL   Hemoglobin 13.6  13.0 - 17.0 g/dL   HCT 38.4 (*) 39.0 - 52.0 %   MCV 95.3  78.0 - 100.0 fL   MCH 33.7  26.0 - 34.0 pg   MCHC 35.4  30.0 - 36.0 g/dL   RDW 12.3  11.5 - 15.5 %   Platelets 287  150 - 400 K/uL  COMPREHENSIVE METABOLIC PANEL     Status: Abnormal   Collection Time    03/10/14  1:10 PM      Result Value Ref Range   Sodium 139  137 - 147 mEq/L   Potassium 4.1  3.7 - 5.3 mEq/L   Chloride 100  96 - 112 mEq/L   CO2 22  19 - 32 mEq/L   Glucose, Bld 169 (*) 70 - 99 mg/dL   BUN 27 (*) 6 - 23 mg/dL   Creatinine, Ser 1.50 (*) 0.50 - 1.35 mg/dL   Calcium 9.6  8.4 - 10.5 mg/dL   Total Protein 6.8  6.0 - 8.3 g/dL   Albumin 4.2  3.5 - 5.2 g/dL   AST 29  0 - 37 U/L   ALT 31  0 - 53 U/L   Alkaline Phosphatase 85  39 - 117 U/L   Total Bilirubin 0.7  0.3 - 1.2 mg/dL   GFR calc non Af Amer 46 (*) >90 mL/min   GFR calc Af Amer 53 (*) >90 mL/min   Comment: (NOTE)  The eGFR has  been calculated using the CKD EPI equation.     This calculation has not been validated in all clinical situations.     eGFR's persistently <90 mL/min signify possible Chronic Kidney     Disease.  TYPE AND SCREEN     Status: None   Collection Time    03/10/14  1:10 PM      Result Value Ref Range   ABO/RH(D) O POS     Antibody Screen NEG     Sample Expiration 03/13/2014    PROTIME-INR     Status: None   Collection Time    03/10/14  1:10 PM      Result Value Ref Range   Prothrombin Time 13.1  11.6 - 15.2 seconds   INR 1.01  0.00 - 1.49  APTT     Status: None   Collection Time    03/10/14  1:10 PM      Result Value Ref Range   aPTT 24  24 - 37 seconds  TROPONIN I     Status: None   Collection Time    03/10/14  1:10 PM      Result Value Ref Range   Troponin I <0.30  <0.30 ng/mL   Comment:            Due to the release kinetics of cTnI,     a negative result within the first hours     of the onset of symptoms does not rule out     myocardial infarction with certainty.     If myocardial infarction is still suspected,     repeat the test at appropriate intervals.  POC OCCULT BLOOD, ED     Status: Abnormal   Collection Time    03/10/14  1:44 PM      Result Value Ref Range   Fecal Occult Bld POSITIVE (*) NEGATIVE  CBG MONITORING, ED     Status: Abnormal   Collection Time    03/10/14  4:57 PM      Result Value Ref Range   Glucose-Capillary 107 (*) 70 - 99 mg/dL  GLUCOSE, CAPILLARY     Status: None   Collection Time    03/10/14  9:42 PM      Result Value Ref Range   Glucose-Capillary 97  70 - 99 mg/dL   Comment 1 Notify RN     Comment 2 Documented in Chart    MRSA PCR SCREENING     Status: Abnormal   Collection Time    03/10/14 10:37 PM      Result Value Ref Range   MRSA by PCR POSITIVE (*) NEGATIVE   Comment:            The GeneXpert MRSA Assay (FDA     approved for NASAL specimens     only), is one component of a     comprehensive MRSA colonization     surveillance  program. It is not     intended to diagnose MRSA     infection nor to guide or     monitor treatment for     MRSA infections.     RESULT CALLED TO, READ BACK BY AND VERIFIED WITH:     D DILLON,RN 03/11/14 0128 [facility] M  BASIC METABOLIC PANEL     Status: Abnormal   Collection Time    03/11/14  3:02 AM      Result Value Ref Range   Sodium 141  137 -  147 mEq/L   Potassium 3.7  3.7 - 5.3 mEq/L   Chloride 105  96 - 112 mEq/L   CO2 22  19 - 32 mEq/L   Glucose, Bld 99  70 - 99 mg/dL   BUN 22  6 - 23 mg/dL   Creatinine, Ser 1.20  0.50 - 1.35 mg/dL   Calcium 8.4  8.4 - 10.5 mg/dL   GFR calc non Af Amer 60 (*) >90 mL/min   GFR calc Af Amer 70 (*) >90 mL/min   Comment: (NOTE)     The eGFR has been calculated using the CKD EPI equation.     This calculation has not been validated in all clinical situations.     eGFR's persistently <90 mL/min signify possible Chronic Kidney     Disease.  CBC     Status: Abnormal   Collection Time    03/11/14  3:02 AM      Result Value Ref Range   WBC 6.4  4.0 - 10.5 K/uL   RBC 3.08 (*) 4.22 - 5.81 MIL/uL   Hemoglobin 10.4 (*) 13.0 - 17.0 g/dL   Comment: DELTA CHECK NOTED     REPEATED TO VERIFY     SPECIMEN CHECKED FOR CLOTS   HCT 29.5 (*) 39.0 - 52.0 %   MCV 95.8  78.0 - 100.0 fL   MCH 33.8  26.0 - 34.0 pg   MCHC 35.3  30.0 - 36.0 g/dL   RDW 12.6  11.5 - 15.5 %   Platelets 198  150 - 400 K/uL   Comment: DELTA CHECK NOTED     REPEATED TO VERIFY     SPECIMEN CHECKED FOR CLOTS  GLUCOSE, CAPILLARY     Status: Abnormal   Collection Time    03/11/14  8:08 AM      Result Value Ref Range   Glucose-Capillary 124 (*) 70 - 99 mg/dL    No results found.            Blood pressure 121/53, pulse 65, temperature 97.6 F (36.4 C), temperature source Oral, resp. rate 13, height $RemoveBe'5\' 11"'[id number]$  (1.803 m), weight 97.3 kg (214 lb 8.1 oz), SpO2 95.00%.  Physical exam:   General-- alert white male no acute distress Heart-- regular rate and rhythm without  murmurs are gallops Lungs--clear Abdomen-- completely soft and nontender   Assessment: 1. G.I. bleeding. There's been a significant drop in hemoglobin in the blood is bright red. The UN creatinine are normal. I suspect this is lower G.I. probably hemorrhoidal, but in view of the Naprosyn use I think we need to rule out  Bleeding from NSAID induced ulcer  Plan: 1. We will go ahead and proceed with EGD this morning. If this is negative, we might want to consider colonoscopy this admission to help make a decision about antiplatelet agents.   Cicley Ganesh JR,Celedonio Sortino L 03/11/2014, 8:33 AM

## 2014-03-11 NOTE — Progress Notes (Signed)
Physician Daily Progress Note  Subjective: Hungry this AM . No further bloody BMs  No other issues   Objective: Vital signs in last 24 hours: Temp:  [97.6 F (36.4 C)-97.9 F (36.6 C)] 97.6 F (36.4 C) (03/13 0315) Pulse Rate:  [50-72] 65 (03/13 0315) Resp:  [11-26] 13 (03/13 0315) BP: (102-158)/(42-68) 121/53 mmHg (03/13 0315) SpO2:  [95 %-100 %] 95 % (03/13 0315) Weight:  [97.07 kg (214 lb)-97.3 kg (214 lb 8.1 oz)] 97.3 kg (214 lb 8.1 oz) (03/13 0659) Weight change:  Last BM Date: 03/10/14  CBG (last 3)   Recent Labs  03/10/14 1657 03/10/14 2142  GLUCAP 107* 97    Intake/Output from previous day:  Intake/Output Summary (Last 24 hours) at 03/11/14 0813 Last data filed at 03/11/14 0700  Gross per 24 hour  Intake   1965 ml  Output    400 ml  Net   1565 ml   03/12 0701 - 03/13 0700 In: 1965 [I.V.:1965] Out: 400 [Urine:400]  Physical Exam General appearance: WM in NAD  Eyes: no scleral icterus Throat: oropharynx moist without erythema Resp: CTAB, no wheezes, rlaes  Cardio: RRR, no MRG  GI: soft, non-tender; bowel sounds normal; no masses,  no organomegaly Extremities: no clubbing, cyanosis or edema   Lab Results:  Recent Labs  03/10/14 1310 03/11/14 0302  NA 139 141  K 4.1 3.7  CL 100 105  CO2 22 22  GLUCOSE 169* 99  BUN 27* 22  CREATININE 1.50* 1.20  CALCIUM 9.6 8.4     Recent Labs  03/10/14 1310  AST 29  ALT 31  ALKPHOS 85  BILITOT 0.7  PROT 6.8  ALBUMIN 4.2     Recent Labs  03/10/14 1310 03/11/14 0302  WBC 10.0 6.4  HGB 13.6 10.4*  HCT 38.4* 29.5*  MCV 95.3 95.8  PLT 287 198    Lab Results  Component Value Date   INR 1.01 03/10/2014   INR 0.87 01/11/2014     Recent Labs  03/10/14 1310  TROPONINI <0.30    No results found for this basename: TSH, T4TOTAL, FREET3, T3FREE, THYROIDAB,  in the last 72 hours  No results found for this basename: VITAMINB12, FOLATE, FERRITIN, TIBC, IRON, RETICCTPCT,  in the last 72  hours  Micro Results: Recent Results (from the past 240 hour(s))  MRSA PCR SCREENING     Status: Abnormal   Collection Time    03/10/14 10:37 PM      Result Value Ref Range Status   MRSA by PCR POSITIVE (*) NEGATIVE Final   Comment:            The GeneXpert MRSA Assay (FDA     approved for NASAL specimens     only), is one component of a     comprehensive MRSA colonization     surveillance program. It is not     intended to diagnose MRSA     infection nor to guide or     monitor treatment for     MRSA infections.     RESULT CALLED TO, READ BACK BY AND VERIFIED WITH:     D DILLON,RN 03/11/14 0128 [facility]    Studies/Results: No results found.   Medications: Scheduled: . atorvastatin  80 mg Oral Daily  . Chlorhexidine Gluconate Cloth  6 each Topical Q0600  . famotidine  20 mg Oral BID  . insulin aspart  0-15 Units Subcutaneous TID WC  . insulin aspart  0-5 Units Subcutaneous  QHS  . losartan  100 mg Oral Daily  . metoprolol succinate  100 mg Oral Daily  . mupirocin ointment  1 application Nasal BID  . sodium chloride  3 mL Intravenous Q12H   Continuous: . sodium chloride Stopped (03/10/14 2102)    Assessment/Plan:  GI bleed - pt is NPO this AM. GI is seeing in consultation, appreciate their recs. EGD at 930 to eval for ulceration given chronic NSAID use, if neg, will consider colonoscopy in AM to r/o lower GI bleed. If all above unremarkable, likely hemorrhoidal, d/c on antiplatelet recs per Dr Einar Gip + PPI daily for ulcer ppx therapy   CAD s/p DES in Jan - holding effient and ASA during acute GI bleed while we await GI recs . Dr Einar Gip to see the patient today for future antiplatelet recs in setting of this bleed, he stated likely ASA + plavix  HTN - home meds except holding HCTZ while we hydrate in setting of hypotension w/ GI bleed . Resume home HCTZ when able  DM - holding home metformin while in house. SSI currently, resume metformin on discharge home  HLD - home  meds  hemorrhoids - steroid cream prn  PPx - SCDs while acute bleed  Dispo - home pending continued resolution of bleed and EGD / Colonoscopy results on daily PPI therapy indefinitely   DVT Prophylaxis   LOS: 1 day   Miguel Brock 03/11/2014, 8:13 AM

## 2014-03-11 NOTE — Consult Note (Signed)
[facility] CONSULT Reason for consult: G.I. bleeding Referring Physician: Dr. Cheral Almas is an 68 y.o. male.  HPI: is well-known to my partner Dr. Madilyn Fireman. He has had previous colonoscopy for the removal of colon polyps. Less colonoscopy 2010 with small hyperplastic polyps and hemorrhoids noted. Notable to know diverticulosis was noted in the op note. The patient was admitted with painless G.I. bleeding it was more hematochezia. He has been taking Naprosyn for per site is on chronic basis. He is not been in any as reducing medications. No melena or abdominal pain. Other issues have been cardiac stents last placed in January with the need to be on Effiient. Has had some rectal bleeding as an outpatient and is seen Dr. Michaell Cowing. At some point, hemorrhoidectomy was planned following preoperative colonoscopy. The plan was to wait 6 months after his last stent which was placed in January. Were asked to see the patient regarding this G.I. bleeding.  Past Medical History  Diagnosis Date  . Anginal pain     occ  . Coronary artery disease 11    stents x5  . Hypertension   . Pneumonia     hx  . GERD (gastroesophageal reflux disease)     zantac  . H/O hiatal hernia   . Hepatitis 70's    food   . Diabetes mellitus without complication     TYPE 2  . Bursitis of left hip   . Depression   . PTSD (post-traumatic stress disorder)     Past Surgical History  Procedure Laterality Date  . Elbow arthroscopy Right 89    ulner nerve rel  . Shoulder arthroscopy Left     release of tendon  . Thrumb Right   . Hernia repair Right 64  . Anterior fusion lumbar spine      L5 S1    07/07/2013      Dr Shon Baton  . Anterior lumbar fusion N/A 07/07/2013    Procedure: ANTERIOR LUMBAR FUSION 1 LEVEL ALIF L5-S1;  Surgeon: Venita Lick, MD;  Location: [facility] OR;  Service: Orthopedics;  Laterality: N/A;  . Abdominal exposure N/A 07/07/2013    Procedure: ABDOMINAL EXPOSURE FOR ANTERIOR LUMBAR FUSION;   Surgeon: Larina Earthly, MD;  Location: [facility] OR;  Service: Vascular;  Laterality: N/A;  . Coronary angioplasty  01/11/2014    LAD & CIRCUMFLEX    Family History  Problem Relation Age of Onset  . Heart disease Father     Social History:  reports that he quit smoking about 32 years ago. His smoking use included Cigarettes. He has a 34.5 pack-year smoking history. He has never used smokeless tobacco. He reports that he drinks about 12.6 ounces of alcohol per week. He reports that he does not use illicit drugs.  Allergies:  Allergies  Allergen Reactions  . Lisinopril     cough    Medications; Prior to Admission medications   Medication Sig Start Date End Date Taking? Authorizing Provider  aspirin 81 MG tablet Take 4 tablets (325 mg total) by mouth daily. 01/12/14  Yes Pamella Pert, MD  atorvastatin (LIPITOR) 80 MG tablet Take 80 mg by mouth daily.   Yes Historical Provider, MD  diphenhydrAMINE (BENADRYL) 25 MG tablet Take 25 mg by mouth every 6 (six) hours as needed for allergies. 07/24/13  Yes Olivia Mackie, MD  EPINEPHrine (EPIPEN) 0.3 mg/0.3 mL SOAJ Inject 0.3 mLs (0.3 mg total) into the muscle once. 07/24/13  Yes Olivia Mackie, MD  famotidine (PEPCID) 20 MG tablet Take 1 tablet (20 mg total) by mouth 2 (two) times daily. 07/24/13  Yes Kalman Drape, MD  hydrochlorothiazide (HYDRODIURIL) 25 MG tablet Take 25 mg by mouth every morning.    Yes Historical Provider, MD  losartan (COZAAR) 100 MG tablet Take 100 mg by mouth every morning.    Yes Historical Provider, MD  metFORMIN (GLUCOPHAGE) 500 MG tablet Take 500 mg by mouth 2 (two) times daily with a meal.   Yes Historical Provider, MD  methocarbamol (ROBAXIN) 500 MG tablet Take 500 mg by mouth 3 (three) times daily as needed (pain). 07/09/13  Yes Melina Schools, MD  metoprolol succinate (TOPROL-XL) 100 MG 24 hr tablet Take 100 mg by mouth daily. Take with or immediately following a meal.   Yes Historical Provider, MD  nitroGLYCERIN (NITROSTAT)  0.4 MG SL tablet Place 0.4 mg under the tongue every 5 (five) minutes as needed for chest pain.   Yes Historical Provider, MD  prasugrel (EFFIENT) 10 MG TABS tablet Take 1 tablet (10 mg total) by mouth daily. 01/12/14  Yes Laverda Page, MD   . atorvastatin  80 mg Oral Daily  . Chlorhexidine Gluconate Cloth  6 each Topical Q0600  . famotidine  20 mg Oral BID  . insulin aspart  0-15 Units Subcutaneous TID WC  . insulin aspart  0-5 Units Subcutaneous QHS  . losartan  100 mg Oral Daily  . metoprolol succinate  100 mg Oral Daily  . mupirocin ointment  1 application Nasal BID  . sodium chloride  3 mL Intravenous Q12H   PRN Meds diphenhydrAMINE, HYDROcodone-acetaminophen, methocarbamol, morphine injection, nitroGLYCERIN, ondansetron (ZOFRAN) IV, ondansetron Results for orders placed during the hospital encounter of 03/10/14 (from the past 48 hour(s))  CBC     Status: Abnormal   Collection Time    03/10/14  1:10 PM      Result Value Ref Range   WBC 10.0  4.0 - 10.5 K/uL   RBC 4.03 (*) 4.22 - 5.81 MIL/uL   Hemoglobin 13.6  13.0 - 17.0 g/dL   HCT 38.4 (*) 39.0 - 52.0 %   MCV 95.3  78.0 - 100.0 fL   MCH 33.7  26.0 - 34.0 pg   MCHC 35.4  30.0 - 36.0 g/dL   RDW 12.3  11.5 - 15.5 %   Platelets 287  150 - 400 K/uL  COMPREHENSIVE METABOLIC PANEL     Status: Abnormal   Collection Time    03/10/14  1:10 PM      Result Value Ref Range   Sodium 139  137 - 147 mEq/L   Potassium 4.1  3.7 - 5.3 mEq/L   Chloride 100  96 - 112 mEq/L   CO2 22  19 - 32 mEq/L   Glucose, Bld 169 (*) 70 - 99 mg/dL   BUN 27 (*) 6 - 23 mg/dL   Creatinine, Ser 1.50 (*) 0.50 - 1.35 mg/dL   Calcium 9.6  8.4 - 10.5 mg/dL   Total Protein 6.8  6.0 - 8.3 g/dL   Albumin 4.2  3.5 - 5.2 g/dL   AST 29  0 - 37 U/L   ALT 31  0 - 53 U/L   Alkaline Phosphatase 85  39 - 117 U/L   Total Bilirubin 0.7  0.3 - 1.2 mg/dL   GFR calc non Af Amer 46 (*) >90 mL/min   GFR calc Af Amer 53 (*) >90 mL/min   Comment: (NOTE)  The eGFR has  been calculated using the CKD EPI equation.     This calculation has not been validated in all clinical situations.     eGFR's persistently <90 mL/min signify possible Chronic Kidney     Disease.  TYPE AND SCREEN     Status: None   Collection Time    03/10/14  1:10 PM      Result Value Ref Range   ABO/RH(D) O POS     Antibody Screen NEG     Sample Expiration 03/13/2014    PROTIME-INR     Status: None   Collection Time    03/10/14  1:10 PM      Result Value Ref Range   Prothrombin Time 13.1  11.6 - 15.2 seconds   INR 1.01  0.00 - 1.49  APTT     Status: None   Collection Time    03/10/14  1:10 PM      Result Value Ref Range   aPTT 24  24 - 37 seconds  TROPONIN I     Status: None   Collection Time    03/10/14  1:10 PM      Result Value Ref Range   Troponin I <0.30  <0.30 ng/mL   Comment:            Due to the release kinetics of cTnI,     a negative result within the first hours     of the onset of symptoms does not rule out     myocardial infarction with certainty.     If myocardial infarction is still suspected,     repeat the test at appropriate intervals.  POC OCCULT BLOOD, ED     Status: Abnormal   Collection Time    03/10/14  1:44 PM      Result Value Ref Range   Fecal Occult Bld POSITIVE (*) NEGATIVE  CBG MONITORING, ED     Status: Abnormal   Collection Time    03/10/14  4:57 PM      Result Value Ref Range   Glucose-Capillary 107 (*) 70 - 99 mg/dL  GLUCOSE, CAPILLARY     Status: None   Collection Time    03/10/14  9:42 PM      Result Value Ref Range   Glucose-Capillary 97  70 - 99 mg/dL   Comment 1 Notify RN     Comment 2 Documented in Chart    MRSA PCR SCREENING     Status: Abnormal   Collection Time    03/10/14 10:37 PM      Result Value Ref Range   MRSA by PCR POSITIVE (*) NEGATIVE   Comment:            The GeneXpert MRSA Assay (FDA     approved for NASAL specimens     only), is one component of a     comprehensive MRSA colonization     surveillance  program. It is not     intended to diagnose MRSA     infection nor to guide or     monitor treatment for     MRSA infections.     RESULT CALLED TO, READ BACK BY AND VERIFIED WITH:     D DILLON,RN 03/11/14 0128 [facility] M  BASIC METABOLIC PANEL     Status: Abnormal   Collection Time    03/11/14  3:02 AM      Result Value Ref Range   Sodium 141  137 -  147 mEq/L   Potassium 3.7  3.7 - 5.3 mEq/L   Chloride 105  96 - 112 mEq/L   CO2 22  19 - 32 mEq/L   Glucose, Bld 99  70 - 99 mg/dL   BUN 22  6 - 23 mg/dL   Creatinine, Ser 1.20  0.50 - 1.35 mg/dL   Calcium 8.4  8.4 - 10.5 mg/dL   GFR calc non Af Amer 60 (*) >90 mL/min   GFR calc Af Amer 70 (*) >90 mL/min   Comment: (NOTE)     The eGFR has been calculated using the CKD EPI equation.     This calculation has not been validated in all clinical situations.     eGFR's persistently <90 mL/min signify possible Chronic Kidney     Disease.  CBC     Status: Abnormal   Collection Time    03/11/14  3:02 AM      Result Value Ref Range   WBC 6.4  4.0 - 10.5 K/uL   RBC 3.08 (*) 4.22 - 5.81 MIL/uL   Hemoglobin 10.4 (*) 13.0 - 17.0 g/dL   Comment: DELTA CHECK NOTED     REPEATED TO VERIFY     SPECIMEN CHECKED FOR CLOTS   HCT 29.5 (*) 39.0 - 52.0 %   MCV 95.8  78.0 - 100.0 fL   MCH 33.8  26.0 - 34.0 pg   MCHC 35.3  30.0 - 36.0 g/dL   RDW 12.6  11.5 - 15.5 %   Platelets 198  150 - 400 K/uL   Comment: DELTA CHECK NOTED     REPEATED TO VERIFY     SPECIMEN CHECKED FOR CLOTS  GLUCOSE, CAPILLARY     Status: Abnormal   Collection Time    03/11/14  8:08 AM      Result Value Ref Range   Glucose-Capillary 124 (*) 70 - 99 mg/dL    No results found.            Blood pressure 121/53, pulse 65, temperature 97.6 F (36.4 C), temperature source Oral, resp. rate 13, height $RemoveBe'5\' 11"'miKXANhon$  (1.803 m), weight 97.3 kg (214 lb 8.1 oz), SpO2 95.00%.  Physical exam:   General-- alert white male no acute distress Heart-- regular rate and rhythm without  murmurs are gallops Lungs--clear Abdomen-- completely soft and nontender   Assessment: 1. G.I. bleeding. There's been a significant drop in hemoglobin in the blood is bright red. The UN creatinine are normal. I suspect this is lower G.I. probably hemorrhoidal, but in view of the Naprosyn use I think we need to rule out  Bleeding from NSAID induced ulcer  Plan: 1. We will go ahead and proceed with EGD this morning. If this is negative, we might want to consider colonoscopy this admission to help make a decision about antiplatelet agents.   Zyree Traynham JR,Luna Audia L 03/11/2014, 8:33 AM

## 2014-03-11 NOTE — Progress Notes (Signed)
Utilization review completed. Chancy Claros, RN, BSN. 

## 2014-03-11 NOTE — Progress Notes (Signed)
Patient transferred from ER via stretcher with ER RN at side. Patient ambulated from stretcher to bed without assist, steady gait. Patient oriented to unit and room, instructed on callbell and placed at side. No family at this time, however patient called wife and updated her on patient's location

## 2014-03-11 NOTE — Op Note (Signed)
[facility] [place], [id number]   ENDOSCOPY PROCEDURE REPORT  PATIENT: Miguel, Brock  MR#: [id number] BIRTHDATE: 02/22/1946 , 12  yrs. old GENDER: Male ENDOSCOPIST:Tywone Bembenek Oletta Lamas, MD REFERRED BY:  Dr Ardeth Perfect PROCEDURE DATE:  03/11/2014 PROCEDURE:   EGD ASA CLASS:  class 2 INDICATIONS:   G.I. bleeding MEDICATION:   fentanyl 50 mcg, versed 5 mg IV TOPICAL ANESTHETIC:    cetacaine spray  DESCRIPTION OF PROCEDURE:   The procedure had been explained to the patient and consider obtain. In the left lateral decubitus position the Pentax adult scope was inserted into the esophagus with swallowing without difficulty. There was a moderate sized hiatal hernia and widely patent GEJ. There was no active bleeding, esophagitis, varices, or Barrett's esophagus. The stomach was entered and there was no active bleeding of the stomach. It was examined in the forward and retroflex view. Possibly slight gastric irritation. Duodenum seen well and mild duodenitis but no ulceration or bleeding. The scope was withdrawn in the patient tolerated procedure well.     COMPLICATIONS: None  ENDOSCOPIC IMPRESSION: 1. G.I. bleeding with negative EGD. RECOMMENDATIONS: 1. would continue patient on PPI therapy while Naprosyn. 2. Will proceed with colonoscopy tomorrow morning.    _______________________________ Lorrin MaisLaurence Spates, MD 03/11/2014 9:59 AM CC: Dr Ardeth Perfect, Dr. Johney Maine

## 2014-03-11 NOTE — Consult Note (Signed)
CARDIOLOGY CONSULT NOTE  Patient ID: Miguel Brock MRN: [id number] DOB/AGE: February 16, 1946 68 y.o.  Admit date: 03/10/2014 Referring Physician  Velna Hatchet, MD Primary Physician:  Velna Hatchet, MD Reason for Consultation  CAD and antiplatelet management  HPI: [name] is a 68 year old Caucasian male with history of CAD, HLD, HTN, DM, HCV s/p treatment who presents w/ a lower GI bleed. He has had gross red blood per rectum x 5 today and x 2 in ED. Of note, he recently had banding of his hemorrhoids by [facility] in Feb.  I was asked to see the patient with regard to dual antiplatelet therapy management.  Fortunately patient has not needed any blood transfusion.  He is scheduled for EGD and possible colonoscopy this hospital admission.  Since his angioplasty on 01/11/2014, he has not had any recurrence of chest discomfort he is tolerating all his medications well.  Presently doing well and denies any abdominal discomfort, heartburn, worsening dyspnea.  Otherwise he states that he is doing well except for frankly bloody stools.  Since morning he has not had any further bleeding.   Past Medical History  Diagnosis Date  . Anginal pain     occ  . Coronary artery disease 11    stents x5  . Hypertension   . Pneumonia     hx  . GERD (gastroesophageal reflux disease)     zantac  . H/O hiatal hernia   . Hepatitis 70's    food   . Diabetes mellitus without complication     TYPE 2  . Bursitis of left hip   . Depression   . PTSD (post-traumatic stress disorder)      Past Surgical History  Procedure Laterality Date  . Elbow arthroscopy Right 89    ulner nerve rel  . Shoulder arthroscopy Left     release of tendon  . Thrumb Right   . Hernia repair Right 8  . Anterior fusion lumbar spine      L5 S1    07/07/2013      Dr Rolena Infante  . Anterior lumbar fusion N/A 07/07/2013    Procedure: ANTERIOR LUMBAR FUSION 1 LEVEL ALIF L5-S1;  Surgeon: Melina Schools, MD;  Location: [place];  Service:  Orthopedics;  Laterality: N/A;  . Abdominal exposure N/A 07/07/2013    Procedure: ABDOMINAL EXPOSURE FOR ANTERIOR LUMBAR FUSION;  Surgeon: Rosetta Posner, MD;  Location: [facility] OR;  Service: Vascular;  Laterality: N/A;  . Coronary angioplasty  01/11/2014    LAD & CIRCUMFLEX     Family History  Problem Relation Age of Onset  . Heart disease Father      Social History: History   Social History  . Marital Status: Married    Spouse Name: N/A    Number of Children: N/A  . Years of Education: N/A   Occupational History  . Not on file.   Social History Main Topics  . Smoking status: Former Smoker -- 1.50 packs/day for 23 years    Types: Cigarettes    Quit date: 07/05/1981  . Smokeless tobacco: Never Used  . Alcohol Use: 12.6 oz/week    21 Shots of liquor per week     Comment: 6 shots daily of Crown and Coke, beer "once in a while"  . Drug Use: No  . Sexual Activity: Not on file   Other Topics Concern  . 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 aspirin 81 MG tablet Take 4 tablets (325 mg total) by mouth daily.      Marland Kitchen atorvastatin (LIPITOR) 80 MG tablet Take 80 mg by mouth daily.      . diphenhydrAMINE (BENADRYL) 25 MG tablet Take 25 mg by mouth every 6 (six) hours as needed for allergies.      Marland Kitchen EPINEPHrine (EPIPEN) 0.3 mg/0.3 mL SOAJ Inject 0.3 mLs (0.3 mg total) into the muscle once.  1 Device  1  . famotidine (PEPCID) 20 MG tablet Take 1 tablet (20 mg total) by mouth 2 (two) times daily.  30 tablet  0  . hydrochlorothiazide (HYDRODIURIL) 25 MG tablet Take 25 mg by mouth every morning.       Marland Kitchen losartan (COZAAR) 100 MG tablet Take 100 mg by mouth every morning.       . metFORMIN (GLUCOPHAGE) 500 MG tablet Take 500 mg by mouth 2 (two) times daily with a meal.      . methocarbamol (ROBAXIN) 500 MG tablet Take 500 mg by mouth 3 (three)  times daily as needed (pain).      . metoprolol succinate (TOPROL-XL) 100 MG 24 hr tablet Take 100 mg by mouth daily. Take with or immediately following a meal.      . nitroGLYCERIN (NITROSTAT) 0.4 MG SL tablet Place 0.4 mg under the tongue every 5 (five) minutes as needed for chest pain.      . prasugrel (EFFIENT) 10 MG TABS tablet Take 1 tablet (10 mg total) by mouth daily.  30 tablet  0    Scheduled Meds: . aspirin  325 mg Oral Daily  . atorvastatin  80 mg Oral Daily  . Chlorhexidine Gluconate Cloth  6 each Topical Q0600  . famotidine  20 mg Oral BID  . insulin aspart  0-15 Units Subcutaneous TID WC  . insulin aspart  0-5 Units Subcutaneous QHS  . losartan  100 mg Oral Daily  . metoprolol succinate  100 mg Oral Daily  . mupirocin ointment  1 application Nasal BID  . polyethylene glycol-electrolytes  4,000 mL Oral Once  . sodium chloride  3 mL Intravenous Q12H   Continuous Infusions: . sodium chloride 500 mL (03/11/14 0850)  . sodium chloride    . sodium chloride    . sodium chloride 20 mL/hr (03/11/14 1111)   PRN Meds:.diphenhydrAMINE, HYDROcodone-acetaminophen, methocarbamol, morphine injection, nitroGLYCERIN, ondansetron (ZOFRAN) IV, ondansetron  ROS: General: no fevers/chills/night sweats Eyes: no blurry vision, diplopia, or amaurosis ENT: no sore throat or hearing loss Resp: no cough, wheezing, or hemoptysis CV: no edema or palpitations GU: no dysuria, frequency, or hematuria Skin: no rash Neuro: no headache, numbness, tingling, or weakness of extremities Musculoskeletal: no joint pain or swelling Heme: no bleeding, DVT, or easy bruising Endo: no polydipsia or polyuria    Physical Exam: Blood pressure 120/72, pulse 63, temperature 97.8 F (36.6 C), temperature source Oral, resp. rate 16, height 5\' 11"  (1.803 m), weight 97.3 kg (214 lb 8.1 oz), SpO2 96.00%.  General: Well built and overweight but musculalar body habitus who is in no acute distress. Appears stated  age. Alert Ox3.  There is no cyanosis. HEENT: normal limits. PERRLA, No JVD.  CARDIAC EXAM: S1, S2 normal, no gallop present. No murmur.  CHEST EXAM: No tenderness of chest wall. LUNGS: Clear to percuss and auscultate.  ABDOMEN: No hepatosplenomegaly. BS normal in all 4 quadrants. Abdomen is  non-tender.  EXTREMITY: Full range of movementes, No edema. MUSCULOSKELETAL EXAM: Intact with full range of motion in all 4 extremities.  NEUROLOGIC EXAM: Grossly intact without any focal deficits. Alert O x 3.  VASCULAR EXAM: No skin breakdown. Carotids left soft bruit. Extremities: Femoral pulse normal. Popliteal pulse normal ; Pedal pulse normal.  Labs:   Lab Results  Component Value Date   WBC 6.4 03/11/2014   HGB 10.4* 03/11/2014   HCT 29.5* 03/11/2014   MCV 95.8 03/11/2014   PLT 198 03/11/2014    Recent Labs Lab 03/10/14 1310 03/11/14 0302  NA 139 141  K 4.1 3.7  CL 100 105  CO2 22 22  BUN 27* 22  CREATININE 1.50* 1.20  CALCIUM 9.6 8.4  PROT 6.8  --   BILITOT 0.7  --   ALKPHOS 85  --   ALT 31  --   AST 29  --   GLUCOSE 169* 99   Lab Results  Component Value Date   TROPONINI <0.30 03/10/2014    Lipid Panel  No results found for this basename: chol, trig, hdl, cholhdl, vldl, ldlcalc    EKG: EKG 03/10/2014: Normal sinus rhythm at rate of 62 bpm, nonspecific ST sagging inferior leads.  No evidence of ischemia.  Compared to 01/12/2014, nonspecific ST changes were new.  Radiology: No results found.  ASSESSMENT AND PLAN:  1.  Acute GI bleed, history of rectal hemorrhoids. 2. CAD, Staged intervention: 05/28/11 Proximal and Mid LAD stent 3x22 and 3x38 Resolute and Xience Prime DES. Proximal Circumflex 3x22 Resolute stent.  06/10/11 Stenting of mid RCA with 4x18 Integrity stent (non-DES) and scoring balloon angioplasty of PDA with 2.0x10 Balloon. Patient had fairly severe triple-vessel coronary artery disease suggestive of diabetic disease. 01/11/2014: Successful PTCA and direct stenting of  the mid RCA with implantation of a 4.0 x 24 mm promos Premier drug-eluting stent. Stenosis reduced from 80% to 0%.  Successful PTCA and direct stenting of the mid LAD overlapping the previously placed stents. A 2.5 x 8 mm promos Premier drug-eluting stent. Compared to angiograms that was previously performed in June 2012, overall the coronary vessels have improved with regard to diffuse disease.  3. Hypertension  4. Hyperlipidemia  5. Carotid artery stenosis, asymptomatic. Carotid artery duplex 01/10/2014:  Moderate stenosis of the right distal internal carotid artery and mid internal carotid artery (>50% stenosis in the range of 50-69%).  Mild stenosis of the right bulb (<50% stenosis in the range of 16-49%). 6. Controlled type 2 diabetes mellitus  Recommendation: At this point I will continue to follow the patient closely, he will need dual antiplatelet therapy given the severity of his coronary artery disease, unless there is significant internal bleeding, need for polypectomy, then I would consider starting the patient on aspirin 81 mg by mouth daily along with Plavix 75 mg by mouth dailyas she will need dual antiplatelet therapy.  We can certainly discontinue Effient.  We'll wait for the GI evaluation to be complete, however I would like to start the patient on Plavix at the earliest, possibly this evening.  Laverda Page, MD 03/11/2014, 12:28 PM [facility] [place] Pager: [phone] Office: [phone] If no answer Cell [phone]

## 2014-03-11 NOTE — Care Management Note (Unsigned)
    Page 1 of 1   03/11/2014     3:29:26 PM   CARE MANAGEMENT NOTE 03/11/2014  Patient:  Miguel Brock, Miguel Brock   Account Number:  [id number]  Date Initiated:  03/11/2014  Documentation initiated by:  GRAVES-BIGELOW,Nohelani Benning  Subjective/Objective Assessment:   Pt admitted for GI Bleed. GI consulted EGD(-) with recommendations for PPI and colonoscopy.     Action/Plan:   CM will continue to monitor for disposition needs.   Anticipated DC Date:  03/14/2014   Anticipated DC Plan:  [place]  CM consult      Choice offered to / List presented to:             Status of service:  In process, will continue to follow Medicare Important Message given?   (If response is "NO", the following Medicare IM given date fields will be blank) Date Medicare IM given:   Date Additional Medicare IM given:    Discharge Disposition:    Per UR Regulation:  Reviewed for med. necessity/level of care/duration of stay  If discussed at [place] of Stay Meetings, dates discussed:    Comments:

## 2014-03-11 NOTE — Interval H&P Note (Signed)
History and Physical Interval Note:  03/11/2014 9:41 AM  Miguel Brock  has presented today for surgery, with the diagnosis of gi bleed  The various methods of treatment have been discussed with the patient and family. After consideration of risks, benefits and other options for treatment, the patient has consented to  Procedure(s): ESOPHAGOGASTRODUODENOSCOPY (EGD) (N/A) as a surgical intervention .  The patient's history has been reviewed, patient examined, no change in status, stable for surgery.  I have reviewed the patient's chart and labs.  Questions were answered to the patient's satisfaction.     Billiejo Sorto JR,Basim Bartnik L

## 2014-03-12 ENCOUNTER — Encounter (HOSPITAL_COMMUNITY): Payer: Self-pay

## 2014-03-12 ENCOUNTER — Encounter (HOSPITAL_COMMUNITY)
Admission: EM | Disposition: A | Discharge status: Home / Self Care | Payer: Self-pay | Source: Home / Self Care | Attending: Internal Medicine

## 2014-03-12 DIAGNOSIS — K648 Other hemorrhoids: Secondary | ICD-10-CM

## 2014-03-12 HISTORY — PX: COLONOSCOPY: SHX5424

## 2014-03-12 LAB — CBC
HEMATOCRIT: 29.3 % — AB (ref 39.0–52.0)
HEMATOCRIT: 30 % — AB (ref 39.0–52.0)
HEMOGLOBIN: 10.5 g/dL — AB (ref 13.0–17.0)
Hemoglobin: 10.2 g/dL — ABNORMAL LOW (ref 13.0–17.0)
MCH: 33.3 pg (ref 26.0–34.0)
MCH: 33.4 pg (ref 26.0–34.0)
MCHC: 34.8 g/dL (ref 30.0–36.0)
MCHC: 35 g/dL (ref 30.0–36.0)
MCV: 95.8 fL (ref 78.0–100.0)
MCV: 95.5 fL (ref 78.0–100.0)
Platelets: 194 10*3/uL (ref 150–400)
Platelets: 209 10*3/uL (ref 150–400)
RBC: 3.06 MIL/uL — AB (ref 4.22–5.81)
RBC: 3.14 MIL/uL — AB (ref 4.22–5.81)
RDW: 12.5 % (ref 11.5–15.5)
RDW: 12.4 % (ref 11.5–15.5)
WBC: 6.8 10*3/uL (ref 4.0–10.5)
WBC: 5.6 10*3/uL (ref 4.0–10.5)

## 2014-03-12 LAB — GLUCOSE, CAPILLARY
GLUCOSE-CAPILLARY: 107 mg/dL — AB (ref 70–99)
Glucose-Capillary: 119 mg/dL — ABNORMAL HIGH (ref 70–99)
Glucose-Capillary: 110 mg/dL — ABNORMAL HIGH (ref 70–99)
Glucose-Capillary: 135 mg/dL — ABNORMAL HIGH (ref 70–99)

## 2014-03-12 LAB — BASIC METABOLIC PANEL
BUN: 14 mg/dL (ref 6–23)
CHLORIDE: 108 meq/L (ref 96–112)
CO2: 22 mEq/L (ref 19–32)
CREATININE: 1.24 mg/dL (ref 0.50–1.35)
Calcium: 8.7 mg/dL (ref 8.4–10.5)
GFR calc non Af Amer: 58 mL/min — ABNORMAL LOW (ref >90)
GFR, EST AFRICAN AMERICAN: 67 mL/min — AB (ref >90)
GLUCOSE: 101 mg/dL — AB (ref 70–99)
Potassium: 4 mEq/L (ref 3.7–5.3)
Sodium: 142 mEq/L (ref 137–147)

## 2014-03-12 SURGERY — COLONOSCOPY
Anesthesia: Moderate Sedation

## 2014-03-12 MED ORDER — INSULIN ASPART 100 UNIT/ML ~~LOC~~ SOLN
0.0000 [IU] | Freq: Three times a day (TID) | SUBCUTANEOUS | Status: DC
  Administered 2014-03-13: 13:00:00 via SUBCUTANEOUS

## 2014-03-12 MED ORDER — PANTOPRAZOLE SODIUM 40 MG PO TBEC: 40.0000 mg | DELAYED_RELEASE_TABLET | Freq: Every day | ORAL | Status: DC

## 2014-03-12 MED ORDER — FENTANYL CITRATE 0.05 MG/ML IJ SOLN
INTRAMUSCULAR | Status: AC
  Filled 2014-03-12: qty 2

## 2014-03-12 MED ORDER — CLOPIDOGREL BISULFATE 75 MG PO TABS
75.0000 mg | ORAL_TABLET | Freq: Every day | ORAL | Status: DC
  Administered 2014-03-13: 75 mg via ORAL
  Filled 2014-03-12 (×2): qty 1

## 2014-03-12 MED ORDER — DIPHENHYDRAMINE HCL 50 MG/ML IJ SOLN
INTRAMUSCULAR | Status: DC | PRN
  Administered 2014-03-12: 25 mg via INTRAVENOUS

## 2014-03-12 MED ORDER — CLOPIDOGREL BISULFATE 75 MG PO TABS
75.0000 mg | ORAL_TABLET | Freq: Every day | ORAL | Status: DC
  Administered 2014-03-12: 75 mg via ORAL
  Filled 2014-03-12 (×2): qty 1

## 2014-03-12 MED ORDER — FENTANYL CITRATE 0.05 MG/ML IJ SOLN
INTRAMUSCULAR | Status: DC | PRN
  Administered 2014-03-12 (×3): 25 ug via INTRAVENOUS

## 2014-03-12 MED ORDER — CLOPIDOGREL BISULFATE 75 MG PO TABS
75.0000 mg | ORAL_TABLET | Freq: Every day | ORAL | Status: DC
Start: 2014-03-12 — End: 2014-05-16

## 2014-03-12 MED ORDER — DIPHENHYDRAMINE HCL 50 MG/ML IJ SOLN
INTRAMUSCULAR | Status: AC
  Filled 2014-03-12: qty 1

## 2014-03-12 MED ORDER — MIDAZOLAM HCL 5 MG/ML IJ SOLN
INTRAMUSCULAR | Status: AC
  Filled 2014-03-12: qty 2

## 2014-03-12 MED ORDER — MIDAZOLAM HCL 5 MG/5ML IJ SOLN
INTRAMUSCULAR | Status: DC | PRN
  Administered 2014-03-12 (×3): 2 mg via INTRAVENOUS

## 2014-03-12 NOTE — Progress Notes (Addendum)
Physician Daily Progress Note  Subjective:  No further bloody BMs  No other issues  Appreciate Dr Einar Gip and Dr Oletta Lamas input. EGD (-) yestersday - On PPI therapy especially c Naprosyn and for colonoscopy today  He has no new c/o He is hungry   Objective: Vital signs in last 24 hours: Temp:  [97.3 F (36.3 C)-97.9 F (36.6 C)] 97.6 F (36.4 C) (03/14 0700) Pulse Rate:  [52-62] 57 (03/14 0737) Resp:  [11-18] 17 (03/14 0737) BP: (120-147)/(62-76) 126/74 mmHg (03/14 0737) SpO2:  [95 %-100 %] 98 % (03/14 0737) Weight:  [95.9 kg (211 lb 6.7 oz)] 95.9 kg (211 lb 6.7 oz) (03/14 0539) Weight change: -1.17 kg (-2 lb 9.3 oz) Last BM Date: 03/12/14  CBG (last 3)   Recent Labs  03/11/14 1805 03/11/14 2116 03/12/14 0734  GLUCAP 110* 103* 119*    Intake/Output from previous day:  Intake/Output Summary (Last 24 hours) at 03/12/14 1104 Last data filed at 03/12/14 0815  Gross per 24 hour  Intake   2180 ml  Output   1650 ml  Net    530 ml   03/13 0701 - 03/14 0700 In: 2160 [P.O.:1760; I.V.:400] Out: 2050 [Urine:2050]  Physical Exam General appearance: WM in NAD  Eyes: no scleral icterus Throat: oropharynx moist without erythema Resp: CTAB, no wheezes, rlaes  Cardio: RRR, no MRG  GI: soft, non-tender; bowel sounds normal; no masses,  no organomegaly Extremities: no clubbing, cyanosis or edema   Lab Results:  Recent Labs  03/11/14 0302 03/12/14 0130  NA 141 142  K 3.7 4.0  CL 105 108  CO2 22 22  GLUCOSE 99 101*  BUN 22 14  CREATININE 1.20 1.24  CALCIUM 8.4 8.7     Recent Labs  03/10/14 1310  AST 29  ALT 31  ALKPHOS 85  BILITOT 0.7  PROT 6.8  ALBUMIN 4.2     Recent Labs  03/11/14 1148 03/12/14 0130  WBC 6.2 6.8  HGB 10.5* 10.2*  HCT 30.1* 29.3*  MCV 96.2 95.8  PLT 198 194    Lab Results  Component Value Date   INR 1.01 03/10/2014   INR 0.87 01/11/2014     Recent Labs  03/10/14 1310  TROPONINI <0.30    No results found for this  basename: TSH, T4TOTAL, FREET3, T3FREE, THYROIDAB,  in the last 72 hours  No results found for this basename: VITAMINB12, FOLATE, FERRITIN, TIBC, IRON, RETICCTPCT,  in the last 72 hours  Micro Results: Recent Results (from the past 240 hour(s))  MRSA PCR SCREENING     Status: Abnormal   Collection Time    03/10/14 10:37 PM      Result Value Ref Range Status   MRSA by PCR POSITIVE (*) NEGATIVE Final   Comment:            The GeneXpert MRSA Assay (FDA     approved for NASAL specimens     only), is one component of a     comprehensive MRSA colonization     surveillance program. It is not     intended to diagnose MRSA     infection nor to guide or     monitor treatment for     MRSA infections.     RESULT CALLED TO, READ BACK BY AND VERIFIED WITH:     D DILLON,RN 03/11/14 0128 SHIPMAN M    Studies/Results: No results found.   Medications: Scheduled: . aspirin  325 mg Oral Daily  .  atorvastatin  80 mg Oral Daily  . Chlorhexidine Gluconate Cloth  6 each Topical Q0600  . famotidine  20 mg Oral BID  . insulin aspart  0-15 Units Subcutaneous TID WC  . insulin aspart  0-5 Units Subcutaneous QHS  . losartan  100 mg Oral Daily  . metoprolol succinate  100 mg Oral Daily  . mupirocin ointment  1 application Nasal BID  . sodium chloride  3 mL Intravenous Q12H   Continuous: . sodium chloride 500 mL (03/11/14 0850)  . sodium chloride    . sodium chloride    . sodium chloride 20 mL/hr at 03/11/14 1900    Assessment/Plan:  GI bleed - S/P (-) EGD. HD and Hbg Stable.  For Colon.  Continue PPI.  If all w/up unremarkable, likely hemorrhoidal, d/c on antiplatelet recs per Dr Einar Gip (Plavix - Load dose 300 and the 75 daily) + PPI daily for ulcer ppx therapy   CAD s/p DES in Jan - holding effient and ASA during acute GI bleed. Dr Irven Shelling eval appreciated. HTN - home meds except holding HCTZ while we hydrate in setting of hypotension w/ GI bleed . Resume home HCTZ when able. Current BP  excellent.  DM - holding home metformin while in house. SSI currently, resume metformin on discharge home  HLD - home meds  hemorrhoids - steroid cream prn  PPx - SCDs while acute bleed  Dispo - home tomorrow? PPI therapy indefinitely   Anticipate transfer to Tele, load c Plavix (if OK c GI), restarting meds and work on D/c for tomorrow.   LOS: 2 days   Miguel Brock M 03/12/2014, 11:04 AM

## 2014-03-12 NOTE — Discharge Instructions (Signed)

## 2014-03-12 NOTE — Op Note (Signed)
Pre Operative Diagnosis:  Bleeding/ulcerated Internal Hemorrhoid  Post Operative Diagnosis: same  Procedure: Anoscopy, banding of internal hemorrhoid  Surgeon: Dr. Ralene Ok  Assistant: none  Anesthesia: none  EBL: none  Complications: none  Counts: reported as correct x 2  Findings:  Pt had a pedunculated internal hemorroid at the 4:00 position.  The stalk was ulcerated.  No active extrav.  Details of the procedure: The patient was placed in lateral position.  I proceed to do a DRE and could easily see the prolapsed internal hemorrhoid.  There were no further masses palpated.  A anoscope was advanced and the hemorrhoid easily identified.  A band was placed at the base.  The anoscope was withdrawn.  The patient was taken from the Endo PACU to the floor  in stable condition.  Pt tol the procedure well.

## 2014-03-12 NOTE — H&P (View-Only) (Signed)
Physician Daily Progress Note  Subjective:  No further bloody BMs  No other issues  Appreciate Dr Einar Gip and Dr Oletta Lamas input. EGD (-) yestersday - On PPI therapy especially c Naprosyn and for colonoscopy today  He has no new c/o He is hungry   Objective: Vital signs in last 24 hours: Temp:  [97.3 F (36.3 C)-97.9 F (36.6 C)] 97.6 F (36.4 C) (03/14 0700) Pulse Rate:  [52-62] 57 (03/14 0737) Resp:  [11-18] 17 (03/14 0737) BP: (120-147)/(62-76) 126/74 mmHg (03/14 0737) SpO2:  [95 %-100 %] 98 % (03/14 0737) Weight:  [95.9 kg (211 lb 6.7 oz)] 95.9 kg (211 lb 6.7 oz) (03/14 0539) Weight change: -1.17 kg (-2 lb 9.3 oz) Last BM Date: 03/12/14  CBG (last 3)   Recent Labs  03/11/14 1805 03/11/14 2116 03/12/14 0734  GLUCAP 110* 103* 119*    Intake/Output from previous day:  Intake/Output Summary (Last 24 hours) at 03/12/14 1104 Last data filed at 03/12/14 0815  Gross per 24 hour  Intake   2180 ml  Output   1650 ml  Net    530 ml   03/13 0701 - 03/14 0700 In: 2160 [P.O.:1760; I.V.:400] Out: 2050 [Urine:2050]  Physical Exam General appearance: WM in NAD  Eyes: no scleral icterus Throat: oropharynx moist without erythema Resp: CTAB, no wheezes, rlaes  Cardio: RRR, no MRG  GI: soft, non-tender; bowel sounds normal; no masses,  no organomegaly Extremities: no clubbing, cyanosis or edema   Lab Results:  Recent Labs  03/11/14 0302 03/12/14 0130  NA 141 142  K 3.7 4.0  CL 105 108  CO2 22 22  GLUCOSE 99 101*  BUN 22 14  CREATININE 1.20 1.24  CALCIUM 8.4 8.7     Recent Labs  03/10/14 1310  AST 29  ALT 31  ALKPHOS 85  BILITOT 0.7  PROT 6.8  ALBUMIN 4.2     Recent Labs  03/11/14 1148 03/12/14 0130  WBC 6.2 6.8  HGB 10.5* 10.2*  HCT 30.1* 29.3*  MCV 96.2 95.8  PLT 198 194    Lab Results  Component Value Date   INR 1.01 03/10/2014   INR 0.87 01/11/2014     Recent Labs  03/10/14 1310  TROPONINI <0.30    No results found for this  basename: TSH, T4TOTAL, FREET3, T3FREE, THYROIDAB,  in the last 72 hours  No results found for this basename: VITAMINB12, FOLATE, FERRITIN, TIBC, IRON, RETICCTPCT,  in the last 72 hours  Micro Results: Recent Results (from the past 240 hour(s))  MRSA PCR SCREENING     Status: Abnormal   Collection Time    03/10/14 10:37 PM      Result Value Ref Range Status   MRSA by PCR POSITIVE (*) NEGATIVE Final   Comment:            The GeneXpert MRSA Assay (FDA     approved for NASAL specimens     only), is one component of a     comprehensive MRSA colonization     surveillance program. It is not     intended to diagnose MRSA     infection nor to guide or     monitor treatment for     MRSA infections.     RESULT CALLED TO, READ BACK BY AND VERIFIED WITH:     D DILLON,RN 03/11/14 0128 SHIPMAN Brock    Studies/Results: No results found.   Medications: Scheduled: . aspirin  325 mg Oral Daily  .  atorvastatin  80 mg Oral Daily  . Chlorhexidine Gluconate Cloth  6 each Topical Q0600  . famotidine  20 mg Oral BID  . insulin aspart  0-15 Units Subcutaneous TID WC  . insulin aspart  0-5 Units Subcutaneous QHS  . losartan  100 mg Oral Daily  . metoprolol succinate  100 mg Oral Daily  . mupirocin ointment  1 application Nasal BID  . sodium chloride  3 mL Intravenous Q12H   Continuous: . sodium chloride 500 mL (03/11/14 0850)  . sodium chloride    . sodium chloride    . sodium chloride 20 mL/hr at 03/11/14 1900    Assessment/Plan:  GI bleed - S/P (-) EGD. HD and Hbg Stable.  For Colon.  Continue PPI.  If all w/up unremarkable, likely hemorrhoidal, d/c on antiplatelet recs per Dr Ganji (Plavix - Load dose 300 and the 75 daily) + PPI daily for ulcer ppx therapy   CAD s/p DES in Jan - holding effient and ASA during acute GI bleed. Dr Ganji's eval appreciated. HTN - home meds except holding HCTZ while we hydrate in setting of hypotension w/ GI bleed . Resume home HCTZ when able. Current BP  excellent.  DM - holding home metformin while in house. SSI currently, resume metformin on discharge home  HLD - home meds  hemorrhoids - steroid cream prn  PPx - SCDs while acute bleed  Dispo - home tomorrow? PPI therapy indefinitely   Anticipate transfer to Tele, load c Plavix (if OK c GI), restarting meds and work on D/c for tomorrow.   LOS: 2 days   Miguel Brock 03/12/2014, 11:04 AM   

## 2014-03-12 NOTE — Interval H&P Note (Signed)
History and Physical Interval Note:  03/12/2014 12:53 PM  Miguel Brock  has presented today for surgery, with the diagnosis of gi bleeding  The various methods of treatment have been discussed with the patient and family. After consideration of risks, benefits and other options for treatment, the patient has consented to  Procedure(s): COLONOSCOPY (N/A) as a surgical intervention .  The patient's history has been reviewed, patient examined, no change in status, stable for surgery.  I have reviewed the patient's chart and labs.  Questions were answered to the patient's satisfaction.     Littleton Haub M  Assessment:  1.  Blood in stool.  Endoscopy unrevealing. 2.  Acute blood loss anemia. 3.  Coronary artery disease.  Plan:  1.  Colonoscopy. 2.  Risks (bleeding, infection, bowel perforation that could require surgery, sedation-related changes in cardiopulmonary systems), benefits (identification and possible treatment of source of symptoms, exclusion of certain causes of symptoms), and alternatives (watchful waiting, radiographic imaging studies, empiric medical treatment) of colonoscopy were explained to patient/family in detail and patient wishes to proceed.

## 2014-03-12 NOTE — Op Note (Signed)
[facility] [place], [id number]   COLONOSCOPY PROCEDURE REPORT  PATIENT: Miguel Brock, Miguel Brock  MR#: [id number] BIRTHDATE: [birth year] , 40  yrs. old GENDER: Male ENDOSCOPIST: Arta Silence, MD REFERRED QB:[id number] Ardeth Perfect, M.D. PROCEDURE DATE:  03/12/2014 PROCEDURE:   Colonoscopy, diagnostic ASA CLASS:   Class III INDICATIONS:blood in stool. MEDICATIONS: Fentanyl 75 mcg IV, Versed 6 mg IV, and Benadryl 25 mg IV  DESCRIPTION OF PROCEDURE:   After the risks benefits and alternatives of the procedure were thoroughly explained, informed consent was obtained.  A digital rectal exam revealed external hemorrhoids.   The Pentax Ped Colon [id number]  endoscope was introduced through the anus and advanced to the cecum, which was identified by both the appendix and ileocecal valve. No adverse events experienced.   The quality of the prep was adequate.  The instrument was then slowly withdrawn as the colon was fully examined.  There was computer-to-video equipment malfunction during the procedure, which resulted in my inability to capture endoscopic photographs.    Findings:  Upon digital rectal exam, there was a large external hemorrhoid, tender upon manipulation.  Prep quality was adequate. Few sigmoid diverticula.  Otherwise normal colonoscopy to cecum in forward views.  Forward views of rectum were normal.  However, in retroflexed views, there was an approximately 71mm ulcerated segment along a prolapsed internal hemorrhoid.  There was a large visible vessel along the margin of this ulcerated region.  Appearance highly typical of ulcerated internal hemorrhoid.  Appearance not typical of stercoral ulcer  . This region is quite tender to gentle endoscopic manipulation.  There was some small amount of blood in this area, but no rampant fresh blood was identified.  The scope was withdrawn and the procedure completed.  ENDOSCOPIC IMPRESSION:     As  above.  Bleeding is highly likely due to ulcerated internal hemorrhoid with large visible vessel. Patient is at very high risk for rebleeding upon initiation of antiplatelet agents.  RECOMMENDATIONS:     1.  Watch for potential complications of procedure. 2.  Continue clear liquids only. 3.  Will consult surgery for consideration of anoscopy/proctoscopy and possible banding of ulcerated internal hemorrhoid.  Case discussed directly with Dr. Rosendo Gros.  Will continue to hold antiplatelet agent pending surgical evaluation. 4.  Will follow.  eSigned:  Arta Silence, MD 03/12/2014 2:07 PM   cc:

## 2014-03-12 NOTE — Progress Notes (Signed)
NURSING PROGRESS NOTE  Miguel Brock [id number] Transfer Data: 03/12/2014 5:16 PM Attending Provider: Velna Hatchet, MD IDP:[id number], [name], MD Code Status: full  Allergies:  Lisinopril Past Medical History:   has a past medical history of Anginal pain; Coronary artery disease (11); Hypertension; Pneumonia; GERD (gastroesophageal reflux disease); H/O hiatal hernia; Hepatitis (70's); Diabetes mellitus without complication; Bursitis of left hip; Depression; and PTSD (post-traumatic stress disorder). Past Surgical History:   has past surgical history that includes Elbow arthroscopy (Right, 89); Shoulder arthroscopy (Left); thrumb (Right); Hernia repair (Right, 65); Anterior fusion lumbar spine; Anterior lumbar fusion (N/A, 07/07/2013); Abdominal exposure (N/A, 07/07/2013); and Coronary angioplasty (01/11/2014). Social History:   reports that he quit smoking about 32 years ago. His smoking use included Cigarettes. He has a 34.5 pack-year smoking history. He has never used smokeless tobacco. He reports that he drinks about 12.6 ounces of alcohol per week. He reports that he does not use illicit drugs.  Miguel Brock is a 68 y.o. male patient transferred from 3S  Blood pressure 157/85, pulse 59, temperature 97.7 F (36.5 C), temperature source Oral, resp. rate 20, height 5\' 11"  (1.803 m), weight 95.709 kg (211 lb), SpO2 98.00%.  Cardiac Monitoring: Box # 19 in place. Cardiac monitor yields:normal sinus rhythm.  IV Fluids:  IV in place, occlusive dsg intact without redness, IV cath antecubital right, condition patent and no redness normal saline.   Skin: intact  Will continue to evaluate and treat per MD orders.   Joslyn Hy, MSN, RN, [employer]

## 2014-03-12 NOTE — Progress Notes (Signed)
Report called Marita [place], RN [phone]. All belongings to be sent with patient. Spouse at bedside. eICU and CCMT notified of transfer. Pt transferred by wheelchair via NT.

## 2014-03-12 NOTE — Progress Notes (Signed)
Physician notified: Virgina Jock At: 1538  Regarding: Pt back in room saw order for transfer to tele. OK to eat? Will he be starting plavix? Awaiting return response.   Returned Response at: 1538  Order(s): Will place orders for diet and plavix, no loading dose due to banding procedure today.

## 2014-03-12 NOTE — Consult Note (Signed)
Reason for Consult: Bleeding internal hemorrhoid Referring Physician: Dr. Caleen Jobs is an 68 y.o. male.  HPI:  Was consulted by Dr. Paulita Fujita from the Endo PACU.  Pt was initially presented with LGIB.  Pt on anti-plt Rx (Effient) secondary to heart stent.  Pt off Effient.  Per endoscopy pt had bleeding internal hemorrhoid that would benefit from banding as per Dr. Paulita Fujita  Past Medical History  Diagnosis Date  . Anginal pain     occ  . Coronary artery disease 10    stents x5  . Hypertension   . Pneumonia     hx  . GERD (gastroesophageal reflux disease)     zantac  . H/O hiatal hernia   . Hepatitis 70's    food   . Diabetes mellitus without complication     TYPE 2  . Bursitis of left hip   . Depression   . PTSD (post-traumatic stress disorder)     Past Surgical History  Procedure Laterality Date  . Elbow arthroscopy Right 89    ulner nerve rel  . Shoulder arthroscopy Left     release of tendon  . Thrumb Right   . Hernia repair Right 1  . Anterior fusion lumbar spine      L5 S1    07/07/2013      Dr Rolena Infante  . Anterior lumbar fusion N/A 07/07/2013    Procedure: ANTERIOR LUMBAR FUSION 1 LEVEL ALIF L5-S1;  Surgeon: Melina Schools, MD;  Location: [facility];  Service: Orthopedics;  Laterality: N/A;  . Abdominal exposure N/A 07/07/2013    Procedure: ABDOMINAL EXPOSURE FOR ANTERIOR LUMBAR FUSION;  Surgeon: Rosetta Posner, MD;  Location: [facility] OR;  Service: Vascular;  Laterality: N/A;  . Coronary angioplasty  01/11/2014    LAD & CIRCUMFLEX    Family History  Problem Relation Age of Onset  . Heart disease Father     Social History:  reports that he quit smoking about 32 years ago. His smoking use included Cigarettes. He has a 34.5 pack-year smoking history. He has never used smokeless tobacco. He reports that he drinks about 12.6 ounces of alcohol per week. He reports that he does not use illicit drugs.  Allergies:  Allergies  Allergen Reactions  . Lisinopril     cough     Medications: I have reviewed the patient's current medications.  Results for orders placed during the hospital encounter of 03/10/14 (from the past 48 hour(s))  CBG MONITORING, ED     Status: Abnormal   Collection Time    03/10/14  4:57 PM      Result Value Ref Range   Glucose-Capillary 107 (*) 70 - 99 mg/dL  GLUCOSE, CAPILLARY     Status: None   Collection Time    03/10/14  9:42 PM      Result Value Ref Range   Glucose-Capillary 97  70 - 99 mg/dL   Comment 1 Notify RN     Comment 2 Documented in Chart    MRSA PCR SCREENING     Status: Abnormal   Collection Time    03/10/14 10:37 PM      Result Value Ref Range   MRSA by PCR POSITIVE (*) NEGATIVE   Comment:            The GeneXpert MRSA Assay (FDA     approved for NASAL specimens     only), is one component of a     comprehensive MRSA colonization  surveillance program. It is not     intended to diagnose MRSA     infection nor to guide or     monitor treatment for     MRSA infections.     RESULT CALLED TO, READ BACK BY AND VERIFIED WITH:     D DILLON,RN 03/11/14 0128 [address] [facility] M  BASIC METABOLIC PANEL     Status: Abnormal   Collection Time    03/11/14  3:02 AM      Result Value Ref Range   Sodium 141  137 - 147 mEq/L   Potassium 3.7  3.7 - 5.3 mEq/L   Chloride 105  96 - 112 mEq/L   CO2 22  19 - 32 mEq/L   Glucose, Bld 99  70 - 99 mg/dL   BUN 22  6 - 23 mg/dL   Creatinine, Ser 1.20  0.50 - 1.35 mg/dL   Calcium 8.4  8.4 - 10.5 mg/dL   GFR calc non Af Amer 60 (*) >90 mL/min   GFR calc Af Amer 70 (*) >90 mL/min   Comment: (NOTE)     The eGFR has been calculated using the CKD EPI equation.     This calculation has not been validated in all clinical situations.     eGFR's persistently <90 mL/min signify possible Chronic Kidney     Disease.  CBC     Status: Abnormal   Collection Time    03/11/14  3:02 AM      Result Value Ref Range   WBC 6.4  4.0 - 10.5 K/uL   RBC 3.08 (*) 4.22 - 5.81 MIL/uL   Hemoglobin 10.4  (*) 13.0 - 17.0 g/dL   Comment: DELTA CHECK NOTED     REPEATED TO VERIFY     SPECIMEN CHECKED FOR CLOTS   HCT 29.5 (*) 39.0 - 52.0 %   MCV 95.8  78.0 - 100.0 fL   MCH 33.8  26.0 - 34.0 pg   MCHC 35.3  30.0 - 36.0 g/dL   RDW 12.6  11.5 - 15.5 %   Platelets 198  150 - 400 K/uL   Comment: DELTA CHECK NOTED     REPEATED TO VERIFY     SPECIMEN CHECKED FOR CLOTS  GLUCOSE, CAPILLARY     Status: Abnormal   Collection Time    03/11/14  8:08 AM      Result Value Ref Range   Glucose-Capillary 124 (*) 70 - 99 mg/dL  CBC     Status: Abnormal   Collection Time    03/11/14 11:48 AM      Result Value Ref Range   WBC 6.2  4.0 - 10.5 K/uL   RBC 3.13 (*) 4.22 - 5.81 MIL/uL   Hemoglobin 10.5 (*) 13.0 - 17.0 g/dL   HCT 30.1 (*) 39.0 - 52.0 %   MCV 96.2  78.0 - 100.0 fL   MCH 33.5  26.0 - 34.0 pg   MCHC 34.9  30.0 - 36.0 g/dL   RDW 12.7  11.5 - 15.5 %   Platelets 198  150 - 400 K/uL  GLUCOSE, CAPILLARY     Status: Abnormal   Collection Time    03/11/14 12:40 PM      Result Value Ref Range   Glucose-Capillary 153 (*) 70 - 99 mg/dL  GLUCOSE, CAPILLARY     Status: Abnormal   Collection Time    03/11/14  6:05 PM      Result Value Ref Range   Glucose-Capillary 110 (*)  70 - 99 mg/dL   Comment 1 Notify RN     Comment 2 Documented in Chart    GLUCOSE, CAPILLARY     Status: Abnormal   Collection Time    03/11/14  9:16 PM      Result Value Ref Range   Glucose-Capillary 103 (*) 70 - 99 mg/dL  CBC     Status: Abnormal   Collection Time    03/12/14  1:30 AM      Result Value Ref Range   WBC 6.8  4.0 - 10.5 K/uL   RBC 3.06 (*) 4.22 - 5.81 MIL/uL   Hemoglobin 10.2 (*) 13.0 - 17.0 g/dL   HCT 29.3 (*) 39.0 - 52.0 %   MCV 95.8  78.0 - 100.0 fL   MCH 33.3  26.0 - 34.0 pg   MCHC 34.8  30.0 - 36.0 g/dL   RDW 12.5  11.5 - 15.5 %   Platelets 194  150 - 400 K/uL  BASIC METABOLIC PANEL     Status: Abnormal   Collection Time    03/12/14  1:30 AM      Result Value Ref Range   Sodium 142  137 - 147  mEq/L   Potassium 4.0  3.7 - 5.3 mEq/L   Chloride 108  96 - 112 mEq/L   CO2 22  19 - 32 mEq/L   Glucose, Bld 101 (*) 70 - 99 mg/dL   BUN 14  6 - 23 mg/dL   Creatinine, Ser 1.24  0.50 - 1.35 mg/dL   Calcium 8.7  8.4 - 10.5 mg/dL   GFR calc non Af Amer 58 (*) >90 mL/min   GFR calc Af Amer 67 (*) >90 mL/min   Comment: (NOTE)     The eGFR has been calculated using the CKD EPI equation.     This calculation has not been validated in all clinical situations.     eGFR's persistently <90 mL/min signify possible Chronic Kidney     Disease.  GLUCOSE, CAPILLARY     Status: Abnormal   Collection Time    03/12/14  7:34 AM      Result Value Ref Range   Glucose-Capillary 119 (*) 70 - 99 mg/dL   Comment 1 Documented in Chart     Comment 2 Notify RN    GLUCOSE, CAPILLARY     Status: Abnormal   Collection Time    03/12/14 11:49 AM      Result Value Ref Range   Glucose-Capillary 110 (*) 70 - 99 mg/dL   Comment 1 Documented in Chart     Comment 2 Notify RN      No results found.  Review of Systems  Constitutional: Negative.   HENT: Negative.   Eyes: Negative.   Respiratory: Negative.   Cardiovascular: Negative.   Gastrointestinal: Positive for blood in stool.  Genitourinary: Negative.   Musculoskeletal: Negative.   Skin: Negative.   Neurological: Negative.    Blood pressure 136/64, pulse 53, temperature 97.9 F (36.6 C), temperature source Oral, resp. rate 12, height $RemoveBe'5\' 11"'SYPuMOtxs$  (1.803 m), weight 211 lb 6.7 oz (95.9 kg), SpO2 99.00%. Physical Exam  Constitutional: He is oriented to person, place, and time. He appears well-developed and well-nourished.  HENT:  Head: Normocephalic and atraumatic.  Eyes: Conjunctivae and EOM are normal. Pupils are equal, round, and reactive to light.  Neck: Normal range of motion.  Cardiovascular: Normal rate, regular rhythm and normal heart sounds.   Respiratory: Effort normal and breath sounds  normal.  GI: Soft. Bowel sounds are normal. There is no  tenderness. There is no rebound and no guarding.  Genitourinary:     Musculoskeletal: Normal range of motion.  Neurological: He is alert and oriented to person, place, and time.    Assessment/Plan: 68 y/o M with bleeding internal hemorrhoid as per Dr. Erlinda Hong report, likely causing LGIB -Proceed with Anoscopy and Internal hemorrhoid banding -I discussed the procedure with his wife since the pt was still under the effect of sedation.  I d/w her the procedure and the risks and benefits to include but not limited to: bleeding, abscess formation, and the need for possible further procedures.  She understood and wished for [place] to proceed.  Rosario Jacks., Angelika Jerrett 03/12/2014, 2:55 PM

## 2014-03-12 NOTE — Progress Notes (Signed)
Subjective: Patient seen at 9:30 AM Patient denies any chest pain, shortness of breath.  He is being prepped for colonoscopy, states that he has not had any further GI bleed.  Overall doing well.  Objective:  Vital Signs in the last 24 hours: Temp:  [97.3 F (36.3 C)-97.9 F (36.6 C)] 97.9 F (36.6 C) (03/14 1250) Pulse Rate:  [52-75] 53 (03/14 1400) Resp:  [10-18] 12 (03/14 1400) BP: (120-183)/(62-95) 136/64 mmHg (03/14 1355) SpO2:  [96 %-100 %] 99 % (03/14 1400) Weight:  [95.9 kg (211 lb 6.7 oz)] 95.9 kg (211 lb 6.7 oz) (03/14 0539)  Intake/Output from previous day: 03/13 0701 - 03/14 0700 In: 2160 [P.O.:1760; I.V.:400] Out: 2050 [Urine:2050]  Physical Exam: General: Well built and overweight but musculalar body habitus who is in no acute distress. Appears stated age. Alert Ox3.  There is no cyanosis. HEENT: normal limits. PERRLA, No JVD.  CARDIAC EXAM: S1, S2 normal, no gallop present. No murmur.  CHEST EXAM: No tenderness of chest wall. LUNGS: Clear to percuss and auscultate.  ABDOMEN: No hepatosplenomegaly. BS normal in all 4 quadrants. Abdomen is non-tender.  EXTREMITY: Full range of movementes, No edema. MUSCULOSKELETAL EXAM: Intact with full range of motion in all 4 extremities.  NEUROLOGIC EXAM: Grossly intact without any focal deficits. Alert O x 3.  VASCULAR EXAM: No skin breakdown. Carotids left soft bruit. Extremities: Femoral pulse normal. Popliteal pulse normal ; Pedal pulse normal.  Lab Results:  Recent Labs  03/12/14 0130 03/12/14 1525  WBC 6.8 5.6  HGB 10.2* 10.5*  PLT 194 209    Recent Labs  03/11/14 0302 03/12/14 0130  NA 141 142  K 3.7 4.0  CL 105 108  CO2 22 22  GLUCOSE 99 101*  BUN 22 14  CREATININE 1.20 1.24    Recent Labs  03/10/14 1310  TROPONINI <0.30   Hepatic Function Panel  Recent Labs  03/10/14 1310  PROT 6.8  ALBUMIN 4.2  AST 29  ALT 31  ALKPHOS 85  BILITOT 0.7   EKG: EKG 03/10/2014: Normal sinus rhythm at rate of  62 bpm, nonspecific ST sagging inferior leads. No evidence of ischemia. Compared to 01/12/2014, nonspecific ST changes were new  Assessment/Plan:  1. Acute GI bleed, history of rectal hemorrhoids.  2. CAD, Staged intervention: 05/28/11 Proximal and Mid LAD stent 3x22 and 3x38 Resolute and Xience Prime DES. Proximal Circumflex 3x22 Resolute stent.  06/10/11 Stenting of mid RCA with 4x18 Integrity stent (non-DES) and scoring balloon angioplasty of PDA with 2.0x10 Balloon. Patient had fairly severe triple-vessel coronary artery disease suggestive of diabetic disease.  01/11/2014: PTCA and direct stenting of the mid RCA with implantation of a 4.0 x 24 mm promos Premier drug-eluting stent. Stenosis reduced from 80% to 0%.  Successful PTCA and direct stenting of the mid LAD overlapping the previously placed stents. A 2.5 x 8 mm promos Premier drug-eluting stent. Compared to angiograms that was previously performed in June 2012, overall the coronary vessels have improved with regard to diffuse disease.  3. Hypertension  4. Hyperlipidemia  5. DM-2 controlled  Rec: patient's events were noted.  Patient has significant internal hemorrhoids that needs banding, would like to start him on Plavix  At the earliest as it is only been 60 days since his recent drug-eluting stent implantation.  I have already contacted Dr. Paulita Fujita, and if surgeons do agree, would like to start him on Plavix tonight.  He is presently on aspirin alone.  Remains essentially hemodynamically stable  and limit he does not reveal any significant arrhythmias.  Laverda Page, M.D. 03/12/2014, 4:37 PM [facility] Pager: [phone] Office: [phone] If no answer: [phone]

## 2014-03-13 LAB — BASIC METABOLIC PANEL
BUN: 11 mg/dL (ref 6–23)
CHLORIDE: 107 meq/L (ref 96–112)
CO2: 23 mEq/L (ref 19–32)
CREATININE: 1.5 mg/dL — AB (ref 0.50–1.35)
Calcium: 9.1 mg/dL (ref 8.4–10.5)
GFR calc Af Amer: 53 mL/min — ABNORMAL LOW (ref >90)
GFR calc non Af Amer: 46 mL/min — ABNORMAL LOW (ref >90)
Glucose, Bld: 142 mg/dL — ABNORMAL HIGH (ref 70–99)
POTASSIUM: 4 meq/L (ref 3.7–5.3)
Sodium: 141 mEq/L (ref 137–147)

## 2014-03-13 LAB — GLUCOSE, CAPILLARY
Glucose-Capillary: 98 mg/dL (ref 70–99)
Glucose-Capillary: 136 mg/dL — ABNORMAL HIGH (ref 70–99)

## 2014-03-13 LAB — CBC
HCT: 31.2 % — ABNORMAL LOW (ref 39.0–52.0)
HEMOGLOBIN: 11 g/dL — AB (ref 13.0–17.0)
MCH: 34 pg (ref 26.0–34.0)
MCHC: 35.3 g/dL (ref 30.0–36.0)
MCV: 96.3 fL (ref 78.0–100.0)
Platelets: 226 10*3/uL (ref 150–400)
RBC: 3.24 MIL/uL — ABNORMAL LOW (ref 4.22–5.81)
RDW: 12.7 % (ref 11.5–15.5)
WBC: 8.2 10*3/uL (ref 4.0–10.5)

## 2014-03-13 MED ORDER — CLOPIDOGREL BISULFATE 75 MG PO TABS: 75.0000 mg | ORAL_TABLET | Freq: Every day | ORAL | Status: DC

## 2014-03-13 MED ORDER — SALINE SPRAY 0.65 % NA SOLN: 2.0000 | Freq: Three times a day (TID) | NASAL | Status: DC

## 2014-03-13 MED ORDER — HYDROCHLOROTHIAZIDE 25 MG PO TABS: 25.0000 mg | ORAL_TABLET | Freq: Every morning | ORAL | Status: DC

## 2014-03-13 MED ORDER — SALINE SPRAY 0.65 % NA SOLN
2.0000 | Freq: Three times a day (TID) | NASAL | Status: DC
  Administered 2014-03-13: 2 via NASAL
  Filled 2014-03-13: qty 44

## 2014-03-13 NOTE — Progress Notes (Signed)
Orpah Melter discharged Home per MD order.  Discharge instructions reviewed and discussed with the patient, all questions and concerns answered. Copy of instructions and scripts given to patient, along with care notes for new diagnosis & medications.    Medication List    STOP taking these medications       famotidine 20 MG tablet  Commonly known as:  PEPCID     prasugrel 10 MG Tabs tablet  Commonly known as:  EFFIENT      TAKE these medications       aspirin 81 MG tablet  Take 4 tablets (325 mg total) by mouth daily.     atorvastatin 80 MG tablet  Commonly known as:  LIPITOR  Take 80 mg by mouth daily.     clopidogrel 75 MG tablet  Commonly known as:  PLAVIX  Take 1 tablet (75 mg total) by mouth daily with breakfast.     clopidogrel 75 MG tablet  Commonly known as:  PLAVIX  Take 1 tablet (75 mg total) by mouth daily with breakfast.     diphenhydrAMINE 25 MG tablet  Commonly known as:  BENADRYL  Take 25 mg by mouth every 6 (six) hours as needed for allergies.     EPINEPHrine 0.3 mg/0.3 mL Soaj injection  Commonly known as:  EPIPEN  Inject 0.3 mLs (0.3 mg total) into the muscle once.     hydrochlorothiazide 25 MG tablet  Commonly known as:  HYDRODIURIL  Take 1 tablet (25 mg total) by mouth every morning.  Start taking on:  03/21/2014     losartan 100 MG tablet  Commonly known as:  COZAAR  Take 100 mg by mouth every morning.     metFORMIN 500 MG tablet  Commonly known as:  GLUCOPHAGE  Take 500 mg by mouth 2 (two) times daily with a meal.     methocarbamol 500 MG tablet  Commonly known as:  ROBAXIN  Take 500 mg by mouth 3 (three) times daily as needed (pain).     metoprolol succinate 100 MG 24 hr tablet  Commonly known as:  TOPROL-XL  Take 100 mg by mouth daily. Take with or immediately following a meal.     nitroGLYCERIN 0.4 MG SL tablet  Commonly known as:  NITROSTAT  Place 0.4 mg under the tongue every 5 (five) minutes as needed for chest pain.     pantoprazole 40 MG tablet  Commonly known as:  PROTONIX  Take 1 tablet (40 mg total) by mouth daily.     sodium chloride 0.65 % Soln nasal spray  Commonly known as:  OCEAN  Place 2 sprays into both nostrils 3 (three) times daily.        Patients skin is clean, dry and with small wound to left toe from cutting his toenails. IV site discontinued and catheter remains intact. Site without signs and symptoms of complications. Dressing and pressure applied.  Patient ambulated to car with wife,  no distress noted upon discharge.  Tylan Briguglio C 03/13/2014 2:50 PM

## 2014-03-13 NOTE — Discharge Summary (Signed)
Physician Discharge Summary  DISCHARGE SUMMARY   Patient ID: Miguel Brock MR#: [id number] DOB/AGE: 04-07-1946 68 y.o.   Attending Physician:Maekayla Giorgio M  Patient's WNU:UVOZDGUY, [name], MD  Consults:Treatment Team:  Arta Silence, MD Winfield Cunas., MD**  Admit date: 03/10/2014 Discharge date: 03/13/2014  Discharge Diagnoses:  Active Problems:   GI bleed   Patient Active Problem List   Diagnosis Date Noted  . GI bleed 03/10/2014  . Internal prolapsing hemorrhoids with pain/bleeding 02/16/2014  . Coronary artery disease   . GERD (gastroesophageal reflux disease)   . Postsurgical percutaneous transluminal coronary angioplasty (PTCA) status 01/11/2014   Past Medical History  Diagnosis Date  . Anginal pain     occ  . Coronary artery disease 11    stents x5  . Hypertension   . Pneumonia     hx  . GERD (gastroesophageal reflux disease)     zantac  . H/O hiatal hernia   . Hepatitis 70's    food   . Diabetes mellitus without complication     TYPE 2  . Bursitis of left hip   . Depression   . PTSD (post-traumatic stress disorder)     Discharged Condition: good   Discharge Medications:   Medication List    STOP taking these medications       famotidine 20 MG tablet  Commonly known as:  PEPCID     prasugrel 10 MG Tabs tablet  Commonly known as:  EFFIENT      TAKE these medications       aspirin 81 MG tablet  Take 4 tablets (325 mg total) by mouth daily.     atorvastatin 80 MG tablet  Commonly known as:  LIPITOR  Take 80 mg by mouth daily.     clopidogrel 75 MG tablet  Commonly known as:  PLAVIX  Take 1 tablet (75 mg total) by mouth daily with breakfast.     clopidogrel 75 MG tablet  Commonly known as:  PLAVIX  Take 1 tablet (75 mg total) by mouth daily with breakfast.     diphenhydrAMINE 25 MG tablet  Commonly known as:  BENADRYL  Take 25 mg by mouth every 6 (six) hours as needed for allergies.     EPINEPHrine 0.3 mg/0.3 mL Soaj  injection  Commonly known as:  EPIPEN  Inject 0.3 mLs (0.3 mg total) into the muscle once.     hydrochlorothiazide 25 MG tablet  Commonly known as:  HYDRODIURIL  Take 1 tablet (25 mg total) by mouth every morning.  Start taking on:  03/21/2014     losartan 100 MG tablet  Commonly known as:  COZAAR  Take 100 mg by mouth every morning.     metFORMIN 500 MG tablet  Commonly known as:  GLUCOPHAGE  Take 500 mg by mouth 2 (two) times daily with a meal.     methocarbamol 500 MG tablet  Commonly known as:  ROBAXIN  Take 500 mg by mouth 3 (three) times daily as needed (pain).     metoprolol succinate 100 MG 24 hr tablet  Commonly known as:  TOPROL-XL  Take 100 mg by mouth daily. Take with or immediately following a meal.     nitroGLYCERIN 0.4 MG SL tablet  Commonly known as:  NITROSTAT  Place 0.4 mg under the tongue every 5 (five) minutes as needed for chest pain.     pantoprazole 40 MG tablet  Commonly known as:  PROTONIX  Take 1 tablet (40 mg total)  by mouth daily.     sodium chloride 0.65 % Soln nasal spray  Commonly known as:  OCEAN  Place 2 sprays into both nostrils 3 (three) times daily.        Hospital Procedures: No results found.  History of Present Illness:  6 M Pt w/ PMHx of CAD, HLD, HTN, DM, HCV s/p treatment who presented 03/10/14 w/ a lower GI bleed. He has had gross red blood per rectum x 7 on the day of admission. Of note, he recently had banding of his hemorrhoids by [facility] in Feb. His cardiac hx involves angioplasty in 2014 for LAD, RCA and circ coronaries and more recently underwent cath in 01/11/14 where he had DES to the RCA , LAD. He is currently on effient/ASA as antiplatelet therapy. His cardiologist is Dr Einar Gip. His last colonoscopy was Jan 2010 that showed 2 hyperplastic polyps and f/u in 2015 was recommended. This was done by [facility].  Beyond this painless bleed, he had no other complaints. He did have brief period of syncope in the ED but recovered  quickly. Vitals currently stable. Hgb stable.    Hospital Course: He was admitted c a GIB - turned out to be ulcerated internal hemorrhoid. His Hbg trended down from 13.6 to 10.4 and remained in mid 10's. Cards and GI were consulted. No further bloody BMs after he was admitted. EGD (-) 03/11/14 - H2 Blocker was changed to PPI.  He is to remain On PPI therapy especially if he uses Naprosyn.  However this is probably not the best drug while he is on dual anti-platelet drugs.  He Underwent colonoscopy on 03/12/14 and Bleeding is highly likely due to ulcerated internal hemorrhoid with large visible vessel. Patient is at very high risk for rebleeding upon initiation of antiplatelet agents. [facility] - Dr Rosendo Gros Proceeded with Anoscopy and Internal hemorrhoid banding. I had several conversations c Dr Einar Gip and Dr Paulita Fujita. He will remain off Effient indefinately.  It is OK to start plavix but I felt a loading dose was not ideal c the procedures.  He tolerated the 75 mg dose.  ASA was also given back.  No GI issues or blood seen.  Just some dried epistaxis.  Nasal saline added.  After he got back to Step down yesterday he was hungry.  I transferred him to tele and provided a CHO reduced diet.   CAD, Staged intervention: 05/28/11 Proximal and Mid LAD stent 3x22 and 3x38 Resolute and Xience Prime DES. Proximal Circumflex 3x22 Resolute stent.  06/10/11 Stenting of mid RCA with 4x18 Integrity stent (non-DES) and scoring balloon angioplasty of PDA with 2.0x10 Balloon. Patient had fairly severe triple-vessel coronary artery disease suggestive of diabetic disease.  01/11/2014: PTCA and direct stenting of the mid RCA with implantation of a 4.0 x 24 mm promos Premier drug-eluting stent. Stenosis reduced from 80% to 0%.  Successful PTCA and direct stenting of the mid LAD overlapping the previously placed stents. A 2.5 x 8 mm promos Premier drug-eluting stent. Compared to angiograms that was previously performed in June 2012,  overall the coronary vessels have improved with regard to diffuse disease.  Now on ASA/Plavix - if rebleeds come back to hospital.  HTN - home meds. HCTZ held in house while we hydrated in setting of hypotension w/ GI bleed . Resume home HCTZ after he sees Dr Lemmie Evens. Current BP excellent.   DM - metformin held while in house and can be resumed tomorrow. SSI given.  CBGs 98-135. HLD -  home meds  hemorrhoids - steroid cream prn  PPx - SCDs provided. Dispo - home today.  PPI therapy indefinitely   Todays Hbg is 11.  BP fine. Will d/c.   Day of Discharge Exam BP 110/50  Pulse 60  Temp(Src) 97.8 F (36.6 C) (Oral)  Resp 18  Ht 5\' 11"  (1.803 m)  Wt 96.435 kg (212 lb 9.6 oz)  BMI 29.66 kg/m2  SpO2 98%  Physical Exam: General appearance: WM in NAD  Eyes: no scleral icterus  Throat: oropharynx moist without erythema  Resp: CTAB, no wheezes, rlaes  Cardio: RRR, no MRG  GI: soft, non-tender; bowel sounds normal; no masses, no organomegaly  Extremities: no clubbing, cyanosis or edema     Discharge Labs:  Recent Labs  03/12/14 0130 03/13/14 1043  NA 142 141  K 4.0 4.0  CL 108 107  CO2 22 23  GLUCOSE 101* 142*  BUN 14 11  CREATININE 1.24 1.50*  CALCIUM 8.7 9.1    Recent Labs  03/10/14 1310  AST 29  ALT 31  ALKPHOS 85  BILITOT 0.7  PROT 6.8  ALBUMIN 4.2    Recent Labs  03/12/14 1525 03/13/14 1043  WBC 5.6 8.2  HGB 10.5* 11.0*  HCT 30.0* 31.2*  MCV 95.5 96.3  PLT 209 226    Recent Labs  03/10/14 1310  TROPONINI <0.30   No results found for this basename: TSH, T4TOTAL, FREET3, T3FREE, THYROIDAB,  in the last 72 hours No results found for this basename: VITAMINB12, FOLATE, FERRITIN, TIBC, IRON, RETICCTPCT,  in the last 72 hours Lab Results  Component Value Date   INR 1.01 03/10/2014   INR 0.87 01/11/2014       Discharge instructions:  Future Appointments Provider Department Dept Phone   04/14/2014 10:00 AM Adin Hector, MD [facility]  Surgery, [place] [phone]     01-Home or Self Care Follow-up Information   Follow up with Velna Hatchet, MD In 4 days. (for eval and Hbg)    Specialty:  Internal Medicine   Contact information:   [address] [place] [address] [phone]        Disposition: home  Follow-up Appts: Follow-up with Dr. Ardeth Perfect in 4 days at [facility].  Call for appointment.  Condition on Discharge: stable  Tests Needing Follow-up: CBC  Time spent in discharge (includes decision making & examination of pt): 32 min  Signed: Trung Wenzl M 03/13/2014, 12:06 PM

## 2014-03-13 NOTE — Progress Notes (Signed)
Subjective: No further bleeding. Antiplatelet (clopidigrel) restarted yesterday. Tolerating diet.  Objective: Vital signs in last 24 hours: Temp:  [97.7 F (36.5 C)-98 F (36.7 C)] 97.8 F (36.6 C) (03/15 0617) Pulse Rate:  [59-63] 60 (03/15 1109) Resp:  [18-20] 18 (03/15 0617) BP: (110-157)/(50-85) 110/50 mmHg (03/15 1109) SpO2:  [98 %] 98 % (03/15 0617) Weight:  [95.709 kg (211 lb)-96.435 kg (212 lb 9.6 oz)] 96.435 kg (212 lb 9.6 oz) (03/15 0637) Weight change: -0.191 kg (-6.7 oz) Last BM Date: 03/12/14  PE: GEN:  NAD  Lab Results: CBC    Component Value Date/Time   WBC 8.2 03/13/2014 1043   RBC 3.24* 03/13/2014 1043   HGB 11.0* 03/13/2014 1043   HCT 31.2* 03/13/2014 1043   PLT 226 03/13/2014 1043   MCV 96.3 03/13/2014 1043   MCH 34.0 03/13/2014 1043   MCHC 35.3 03/13/2014 1043   RDW 12.7 03/13/2014 1043   CMP     Component Value Date/Time   NA 141 03/13/2014 1043   K 4.0 03/13/2014 1043   CL 107 03/13/2014 1043   CO2 23 03/13/2014 1043   GLUCOSE 142* 03/13/2014 1043   BUN 11 03/13/2014 1043   CREATININE 1.50* 03/13/2014 1043   CALCIUM 9.1 03/13/2014 1043   PROT 6.8 03/10/2014 1310   ALBUMIN 4.2 03/10/2014 1310   AST 29 03/10/2014 1310   ALT 31 03/10/2014 1310   ALKPHOS 85 03/10/2014 1310   BILITOT 0.7 03/10/2014 1310   GFRNONAA 46* 03/13/2014 1043   GFRAA 53* 03/13/2014 1043   Assessment:  1.  Blood in stool.  Suspect from ulcerated internal hemorrhoid.  Received anoscopy with hemorrhoidal banding by Dr. Rosendo Gros yesterday.  No further bleeding. 2.  Coronary artery disease, recent cardiac stent placement, needs to stay on antiplatelet therapy. 3.  Acute blood loss anemia.  Plan:  1.   Patient is being discharged home today. 2.   Hopefully no further bleeding with his recent hemorrhoidal banding. 3.  Can follow-up with [facility] [phone]) in 4-6 weeks with his primary gastroenterologist, Dr. Teena Irani.    Landry Dyke 03/13/2014, 2:05 PM

## 2014-03-14 ENCOUNTER — Encounter (HOSPITAL_COMMUNITY): Payer: Self-pay | Admitting: Gastroenterology

## 2014-03-15 DIAGNOSIS — K644 Residual hemorrhoidal skin tags: Secondary | ICD-10-CM | POA: Diagnosis not present

## 2014-03-16 DIAGNOSIS — M76899 Other specified enthesopathies of unspecified lower limb, excluding foot: Secondary | ICD-10-CM | POA: Diagnosis not present

## 2014-03-16 DIAGNOSIS — M25559 Pain in unspecified hip: Secondary | ICD-10-CM | POA: Diagnosis not present

## 2014-03-25 DIAGNOSIS — M76899 Other specified enthesopathies of unspecified lower limb, excluding foot: Secondary | ICD-10-CM | POA: Diagnosis not present

## 2014-03-29 ENCOUNTER — Encounter (INDEPENDENT_AMBULATORY_CARE_PROVIDER_SITE_OTHER): Payer: Medicare Other | Admitting: Surgery

## 2014-04-08 DIAGNOSIS — I259 Chronic ischemic heart disease, unspecified: Secondary | ICD-10-CM | POA: Diagnosis not present

## 2014-04-08 DIAGNOSIS — M76899 Other specified enthesopathies of unspecified lower limb, excluding foot: Secondary | ICD-10-CM | POA: Diagnosis not present

## 2014-04-08 DIAGNOSIS — I10 Essential (primary) hypertension: Secondary | ICD-10-CM | POA: Diagnosis not present

## 2014-04-08 DIAGNOSIS — E1169 Type 2 diabetes mellitus with other specified complication: Secondary | ICD-10-CM | POA: Diagnosis not present

## 2014-04-08 DIAGNOSIS — Z8719 Personal history of other diseases of the digestive system: Secondary | ICD-10-CM | POA: Diagnosis not present

## 2014-04-08 DIAGNOSIS — K5730 Diverticulosis of large intestine without perforation or abscess without bleeding: Secondary | ICD-10-CM | POA: Diagnosis not present

## 2014-04-08 DIAGNOSIS — Z6831 Body mass index (BMI) 31.0-31.9, adult: Secondary | ICD-10-CM | POA: Diagnosis not present

## 2014-04-08 DIAGNOSIS — K644 Residual hemorrhoidal skin tags: Secondary | ICD-10-CM | POA: Diagnosis not present

## 2014-04-12 DIAGNOSIS — M76899 Other specified enthesopathies of unspecified lower limb, excluding foot: Secondary | ICD-10-CM | POA: Diagnosis not present

## 2014-04-14 ENCOUNTER — Encounter (INDEPENDENT_AMBULATORY_CARE_PROVIDER_SITE_OTHER): Payer: Medicare Other | Admitting: Surgery

## 2014-04-18 ENCOUNTER — Encounter (INDEPENDENT_AMBULATORY_CARE_PROVIDER_SITE_OTHER): Payer: Medicare Other | Admitting: Surgery

## 2014-04-22 DIAGNOSIS — K921 Melena: Secondary | ICD-10-CM | POA: Diagnosis not present

## 2014-04-22 DIAGNOSIS — K648 Other hemorrhoids: Secondary | ICD-10-CM | POA: Diagnosis not present

## 2014-04-30 ENCOUNTER — Other Ambulatory Visit: Payer: Self-pay | Admitting: Orthopedic Surgery

## 2014-05-16 ENCOUNTER — Encounter (HOSPITAL_COMMUNITY): Payer: Self-pay

## 2014-05-16 ENCOUNTER — Encounter (HOSPITAL_COMMUNITY): Payer: Self-pay | Admitting: Pharmacy Technician

## 2014-05-16 ENCOUNTER — Encounter (HOSPITAL_COMMUNITY)
Admission: RE | Admit: 2014-05-16 | Discharge: 2014-05-16 | Disposition: A | Discharge status: Home / Self Care | Payer: Medicare Other | Source: Ambulatory Visit | Attending: Orthopedic Surgery | Admitting: Orthopedic Surgery

## 2014-05-16 ENCOUNTER — Ambulatory Visit (HOSPITAL_COMMUNITY)
Admission: RE | Admit: 2014-05-16 | Discharge: 2014-05-16 | Disposition: A | Discharge status: Home / Self Care | Payer: Medicare Other | Source: Ambulatory Visit | Attending: Anesthesiology | Admitting: Anesthesiology

## 2014-05-16 DIAGNOSIS — M47814 Spondylosis without myelopathy or radiculopathy, thoracic region: Secondary | ICD-10-CM | POA: Diagnosis not present

## 2014-05-16 DIAGNOSIS — Z01818 Encounter for other preprocedural examination: Secondary | ICD-10-CM | POA: Diagnosis not present

## 2014-05-16 LAB — CBC
HCT: 40.8 % (ref 39.0–52.0)
Hemoglobin: 14 g/dL (ref 13.0–17.0)
MCH: 32 pg (ref 26.0–34.0)
MCHC: 34.3 g/dL (ref 30.0–36.0)
MCV: 93.2 fL (ref 78.0–100.0)
Platelets: 237 10*3/uL (ref 150–400)
RBC: 4.38 MIL/uL (ref 4.22–5.81)
RDW: 12.6 % (ref 11.5–15.5)
WBC: 8 10*3/uL (ref 4.0–10.5)

## 2014-05-16 LAB — COMPREHENSIVE METABOLIC PANEL
ALK PHOS: 84 U/L (ref 39–117)
ALT: 34 U/L (ref 0–53)
AST: 34 U/L (ref 0–37)
Albumin: 4.1 g/dL (ref 3.5–5.2)
BILIRUBIN TOTAL: 0.6 mg/dL (ref 0.3–1.2)
BUN: 28 mg/dL — ABNORMAL HIGH (ref 6–23)
CO2: 26 meq/L (ref 19–32)
Calcium: 9.9 mg/dL (ref 8.4–10.5)
Chloride: 96 mEq/L (ref 96–112)
Creatinine, Ser: 1.47 mg/dL — ABNORMAL HIGH (ref 0.50–1.35)
GFR calc Af Amer: 55 mL/min — ABNORMAL LOW (ref >90)
GFR, EST NON AFRICAN AMERICAN: 47 mL/min — AB (ref >90)
Glucose, Bld: 163 mg/dL — ABNORMAL HIGH (ref 70–99)
POTASSIUM: 4 meq/L (ref 3.7–5.3)
SODIUM: 138 meq/L (ref 137–147)
Total Protein: 7.7 g/dL (ref 6.0–8.3)

## 2014-05-16 LAB — SURGICAL PCR SCREEN
MRSA, PCR: POSITIVE — AB
Staphylococcus aureus: POSITIVE — AB

## 2014-05-16 NOTE — Progress Notes (Signed)
ekg 7/14 epic, stress test  1/9/15chart with post ekg in epic,, ekg  With lov 01/20/14 chart Dr Einar Gip, carotid doppler 1/15 chart, clearance with note Dr Einar Gip not to stop asa/plavix

## 2014-05-16 NOTE — Progress Notes (Signed)
Per dr Einar Gip clearance note,-- patient may not stop ASA, Plavix pre op--just FYI  Thank you

## 2014-05-16 NOTE — Progress Notes (Signed)
Faxed cmet to dr aluisio via epic 

## 2014-05-16 NOTE — Patient Instructions (Signed)
Your procedure is scheduled on:  05/24/14  TUESDAY  Report to [facility] at      2:00PM  Call this number if you have problems the morning of surgery: [phone]        Do not eat food :After Midnight.MONDAY NIGHT-- MAY HAVE CLEAR LIQUIDS Tuesday MORNING UNTIL 10:00 am   Take these medicines the morning of surgery with A SIP OF WATER: METOPROLOL, PROTOXIX, PLAVIX, ASPIRIN DO NOT TAKE ANY BLOOD SUGAR MEDICATION MORNING OF SURGERY May take Methocarbamol, Nitroglycerin if needed   .  Contacts, dentures or partial plates, or metal hairpins  can not be worn to surgery. Your family will be responsible for glasses, dentures, hearing aides while you are in surgery  Leave suitcase in the car. After surgery it may be brought to your room.  For patients admitted to the hospital, checkout time is 11:00 AM day of  discharge.                DO NOT WEAR JEWELRY, LOTIONS, POWDERS, OR PERFUMES.  WOMEN-- DO NOT SHAVE LEGS OR UNDERARMS FOR 48 HOURS BEFORE SHOWERS. MEN MAY SHAVE FACE.  Patients discharged the day of surgery will not be allowed to drive home. IF going home the day of surgery, you must have a driver and someone to stay with you for the first 24 hours  Name and phone number of your driver:     Wife  [facility] - Preparing for Surgery Before surgery, you can play an important role.  Because skin is not sterile, your skin needs to be as free of germs as possible.  You can reduce the number of germs on your skin by washing with CHG (chlorahexidine gluconate) soap before surgery.  CHG is an antiseptic cleaner which kills germs and bonds with the skin to continue killing germs even after washing. Please DO NOT use if you have an allergy to CHG or antibacterial soaps.  If your skin becomes reddened/irritated stop using the CHG and inform  your nurse when you arrive at Short Stay. Do not shave (including legs and underarms) for at least 48 hours prior to the first CHG shower.  You may shave your face. Please follow these instructions carefully:  1.  Shower with CHG Soap the night before surgery and the  morning of Surgery.  2.  If you choose to wash your hair, wash your hair first as usual with your  normal  shampoo.  3.  After you shampoo, rinse your hair and body thoroughly to remove the  shampoo.  4.  Use CHG as you would any other liquid soap.  You can apply chg directly  to the skin and wash                       Gently with a scrungie or clean washcloth.  5.  Apply the CHG Soap to your body ONLY FROM THE NECK DOWN.   Do not use on open                           Wound or open sores. Avoid contact with eyes, ears mouth and genitals (private parts).                        Genitals (private parts) with your normal soap.             6.  Wash thoroughly, paying special attention to the area where your surgery  will be performed.  7.  Thoroughly rinse your body with warm water from the neck down.  8.  DO NOT shower/wash with your normal soap after using and rinsing off  the CHG Soap.                9.  Pat yourself dry with a clean towel.            10.  Wear clean pajamas.            11.  Place clean sheets on your bed the night of your first shower and do not  sleep with pets. Day of Surgery : Do not apply any lotions/deodorants the morning of surgery.  Please wear clean clothes to the hospital/surgery center.  FAILURE TO FOLLOW THESE INSTRUCTIONS MAY RESULT IN THE CANCELLATION OF YOUR SURGERY PATIENT SIGNATURE_________________________________  NURSE SIGNATURE__________________________________  ________________________________________________________________________    CLEAR LIQUID DIET   Foods Allowed                                                                     Foods Excluded  Coffee  and tea, regular and decaf                             liquids that you cannot  Plain Jell-O in any flavor                                             see through such as: Fruit ices (not with fruit pulp)                                     milk, soups, orange juice  Iced Popsicles                                    All solid food Carbonated beverages, regular and diet  Cranberry, grape and apple juices Sports drinks like Gatorade Lightly seasoned clear broth or consume(fat free) Sugar, honey syrup  Sample Menu Breakfast                                Lunch                                     Supper Cranberry juice                    Beef broth                            Chicken broth Jell-O                                     Grape juice                           Apple juice Coffee or tea                        Jell-O                                      Popsicle                                                Coffee or tea                        Coffee or tea  _____________________________________________________________________    Incentive Spirometer  An incentive spirometer is a tool that can help keep your lungs clear and active. This tool measures how well you are filling your lungs with each breath. Taking long deep breaths may help reverse or decrease the chance of developing breathing (pulmonary) problems (especially infection) following:  A long period of time when you are unable to move or be active. BEFORE THE PROCEDURE   If the spirometer includes an indicator to show your best effort, your nurse or respiratory therapist will set it to a desired goal.  If possible, sit up straight or lean slightly forward. Try not to slouch.  Hold the incentive spirometer in an upright position. INSTRUCTIONS FOR USE  1. Sit on the edge of your bed if possible, or sit up as far as you can in bed or on a chair. 2. Hold the incentive spirometer in an  upright position. 3. Breathe out normally. 4. Place the mouthpiece in your mouth and seal your lips tightly around it. 5. Breathe in slowly and as deeply as possible, raising the piston or the ball toward the top of the column. 6. Hold your breath for 3-5 seconds or for as long as possible. Allow the piston or ball to fall to the bottom of the column. 7. Remove the mouthpiece from your mouth and breathe out normally. 8. Rest for a few seconds and repeat Steps 1 through 7 at  least 10 times every 1-2 hours when you are awake. Take your time and take a few normal breaths between deep breaths. 9. The spirometer may include an indicator to show your best effort. Use the indicator as a goal to work toward during each repetition. 10. After each set of 10 deep breaths, practice coughing to be sure your lungs are clear. If you have an incision (the cut made at the time of surgery), support your incision when coughing by placing a pillow or rolled up towels firmly against it. Once you are able to get out of bed, walk around indoors and cough well. You may stop using the incentive spirometer when instructed by your caregiver.  RISKS AND COMPLICATIONS  Take your time so you do not get dizzy or light-headed.  If you are in pain, you may need to take or ask for pain medication before doing incentive spirometry. It is harder to take a deep breath if you are having pain. AFTER USE  Rest and breathe slowly and easily.  It can be helpful to keep track of a log of your progress. Your caregiver can provide you with a simple table to help with this. If you are using the spirometer at home, follow these instructions: [place] IF:   You are having difficultly using the spirometer.  You have trouble using the spirometer as often as instructed.  Your pain medication is not giving enough relief while using the spirometer.  You develop fever of 100.5 F (38.1 C) or higher. SEEK IMMEDIATE MEDICAL CARE  IF:   You cough up bloody sputum that had not been present before.  You develop fever of 102 F (38.9 C) or greater.  You develop worsening pain at or near the incision site. MAKE SURE YOU:   Understand these instructions.  Will watch your condition.  Will get help right away if you are not doing well or get worse. Document Released: 04/28/2007 Document Revised: 03/09/2012 Document Reviewed: 06/29/2007 [facility] Patient Information 2014 [place], [place].   ________________________________________________________________________

## 2014-05-16 NOTE — Progress Notes (Addendum)
Spoke with dr Marcell Barlow and made aware pt cannot stop plavix and 81mg  aspirin per dr Everitt Amber, ok for patient to take plavix am of surgery and notify dr Wynelle Link  That patient must stay on aspirin and plavix per dr Everitt Amber

## 2014-05-17 NOTE — Progress Notes (Signed)
Faxed positive pcr to dr Wynelle Link via epic

## 2014-05-24 ENCOUNTER — Encounter (HOSPITAL_COMMUNITY): Payer: Medicare Other | Admitting: Anesthesiology

## 2014-05-24 ENCOUNTER — Observation Stay (HOSPITAL_COMMUNITY)
Admission: RE | Admit: 2014-05-24 | Discharge: 2014-05-25 | Disposition: A | Discharge status: Home / Self Care | Payer: Medicare Other | Source: Ambulatory Visit | Attending: Orthopedic Surgery | Admitting: Orthopedic Surgery

## 2014-05-24 ENCOUNTER — Encounter (HOSPITAL_COMMUNITY)
Admission: RE | Disposition: A | Discharge status: Home / Self Care | Payer: Self-pay | Source: Ambulatory Visit | Attending: Orthopedic Surgery

## 2014-05-24 ENCOUNTER — Encounter (HOSPITAL_COMMUNITY): Payer: Self-pay | Admitting: *Deleted

## 2014-05-24 ENCOUNTER — Ambulatory Visit (HOSPITAL_COMMUNITY): Payer: Medicare Other | Admitting: Anesthesiology

## 2014-05-24 DIAGNOSIS — K219 Gastro-esophageal reflux disease without esophagitis: Secondary | ICD-10-CM | POA: Diagnosis not present

## 2014-05-24 DIAGNOSIS — I2510 Atherosclerotic heart disease of native coronary artery without angina pectoris: Secondary | ICD-10-CM | POA: Insufficient documentation

## 2014-05-24 DIAGNOSIS — M25819 Other specified joint disorders, unspecified shoulder: Secondary | ICD-10-CM | POA: Diagnosis not present

## 2014-05-24 DIAGNOSIS — Z981 Arthrodesis status: Secondary | ICD-10-CM | POA: Diagnosis not present

## 2014-05-24 DIAGNOSIS — M76899 Other specified enthesopathies of unspecified lower limb, excluding foot: Principal | ICD-10-CM | POA: Insufficient documentation

## 2014-05-24 DIAGNOSIS — Z79899 Other long term (current) drug therapy: Secondary | ICD-10-CM | POA: Diagnosis not present

## 2014-05-24 DIAGNOSIS — Z87891 Personal history of nicotine dependence: Secondary | ICD-10-CM | POA: Diagnosis not present

## 2014-05-24 DIAGNOSIS — Z7982 Long term (current) use of aspirin: Secondary | ICD-10-CM | POA: Insufficient documentation

## 2014-05-24 DIAGNOSIS — M7062 Trochanteric bursitis, left hip: Secondary | ICD-10-CM

## 2014-05-24 DIAGNOSIS — E119 Type 2 diabetes mellitus without complications: Secondary | ICD-10-CM | POA: Insufficient documentation

## 2014-05-24 DIAGNOSIS — I10 Essential (primary) hypertension: Secondary | ICD-10-CM | POA: Diagnosis not present

## 2014-05-24 DIAGNOSIS — M6688 Spontaneous rupture of other tendons, other: Secondary | ICD-10-CM | POA: Diagnosis not present

## 2014-05-24 DIAGNOSIS — Z7902 Long term (current) use of antithrombotics/antiplatelets: Secondary | ICD-10-CM | POA: Diagnosis not present

## 2014-05-24 HISTORY — PX: EXCISION/RELEASE BURSA HIP: SHX5014

## 2014-05-24 HISTORY — DX: Trochanteric bursitis, left hip: M70.62

## 2014-05-24 LAB — GLUCOSE, CAPILLARY: Glucose-Capillary: 109 mg/dL — ABNORMAL HIGH (ref 70–99)

## 2014-05-24 SURGERY — RELEASE, BURSA, TROCHANTERIC
Anesthesia: General | Site: Hip | Laterality: Left

## 2014-05-24 MED ORDER — METOCLOPRAMIDE HCL 5 MG PO TABS
5.0000 mg | ORAL_TABLET | Freq: Three times a day (TID) | ORAL | Status: DC | PRN
Start: 2014-05-24 — End: 2014-05-25
  Filled 2014-05-24: qty 2

## 2014-05-24 MED ORDER — OXYCODONE HCL 5 MG PO TABS
5.0000 mg | ORAL_TABLET | ORAL | Status: DC | PRN
  Administered 2014-05-24 – 2014-05-25 (×3): 10 mg via ORAL
  Filled 2014-05-24 (×3): qty 2

## 2014-05-24 MED ORDER — NITROGLYCERIN 0.4 MG SL SUBL: 0.4000 mg | SUBLINGUAL_TABLET | SUBLINGUAL | Status: DC | PRN

## 2014-05-24 MED ORDER — FENTANYL CITRATE 0.05 MG/ML IJ SOLN
INTRAMUSCULAR | Status: DC | PRN
  Administered 2014-05-24 (×4): 50 ug via INTRAVENOUS

## 2014-05-24 MED ORDER — HYDROCHLOROTHIAZIDE 25 MG PO TABS
25.0000 mg | ORAL_TABLET | Freq: Every morning | ORAL | Status: DC
  Administered 2014-05-24 – 2014-05-25 (×2): 25 mg via ORAL
  Filled 2014-05-24 (×2): qty 1

## 2014-05-24 MED ORDER — MEPERIDINE HCL 50 MG/ML IJ SOLN: 6.2500 mg | INTRAMUSCULAR | Status: DC | PRN

## 2014-05-24 MED ORDER — OXYCODONE HCL 5 MG/5ML PO SOLN
5.0000 mg | Freq: Once | ORAL | Status: DC | PRN
  Filled 2014-05-24: qty 5

## 2014-05-24 MED ORDER — MORPHINE SULFATE 2 MG/ML IJ SOLN: 1.0000 mg | INTRAMUSCULAR | Status: DC | PRN

## 2014-05-24 MED ORDER — ONDANSETRON HCL 4 MG PO TABS: 4.0000 mg | ORAL_TABLET | Freq: Four times a day (QID) | ORAL | Status: DC | PRN

## 2014-05-24 MED ORDER — ONDANSETRON HCL 4 MG/2ML IJ SOLN: 4.0000 mg | Freq: Four times a day (QID) | INTRAMUSCULAR | Status: DC | PRN

## 2014-05-24 MED ORDER — CEFAZOLIN SODIUM-DEXTROSE 2-3 GM-% IV SOLR
INTRAVENOUS | Status: AC
  Filled 2014-05-24: qty 50

## 2014-05-24 MED ORDER — BUPIVACAINE LIPOSOME 1.3 % IJ SUSP
20.0000 mL | Freq: Once | INTRAMUSCULAR | Status: DC
  Filled 2014-05-24: qty 20

## 2014-05-24 MED ORDER — CEFAZOLIN SODIUM-DEXTROSE 2-3 GM-% IV SOLR
2.0000 g | INTRAVENOUS | Status: AC
  Administered 2014-05-24: 2 g via INTRAVENOUS

## 2014-05-24 MED ORDER — METHOCARBAMOL 500 MG PO TABS: 500.0000 mg | ORAL_TABLET | Freq: Three times a day (TID) | ORAL | Status: DC | PRN

## 2014-05-24 MED ORDER — HYDROMORPHONE HCL PF 1 MG/ML IJ SOLN
0.2500 mg | INTRAMUSCULAR | Status: DC | PRN
  Administered 2014-05-24 (×2): 0.5 mg via INTRAVENOUS

## 2014-05-24 MED ORDER — MIDAZOLAM HCL 2 MG/2ML IJ SOLN
INTRAMUSCULAR | Status: AC
  Filled 2014-05-24: qty 2

## 2014-05-24 MED ORDER — METOPROLOL SUCCINATE ER 100 MG PO TB24
100.0000 mg | ORAL_TABLET | Freq: Every morning | ORAL | Status: DC
Start: 2014-05-25 — End: 2014-05-25
  Administered 2014-05-25: 100 mg via ORAL
  Filled 2014-05-24: qty 1

## 2014-05-24 MED ORDER — ASPIRIN EC 81 MG PO TBEC
81.0000 mg | DELAYED_RELEASE_TABLET | Freq: Every day | ORAL | Status: DC
  Administered 2014-05-25: 81 mg via ORAL
  Filled 2014-05-24: qty 1

## 2014-05-24 MED ORDER — ONDANSETRON HCL 4 MG/2ML IJ SOLN
INTRAMUSCULAR | Status: DC | PRN
  Administered 2014-05-24: 4 mg via INTRAVENOUS

## 2014-05-24 MED ORDER — LOSARTAN POTASSIUM 50 MG PO TABS
100.0000 mg | ORAL_TABLET | Freq: Every morning | ORAL | Status: DC
  Administered 2014-05-24 – 2014-05-25 (×2): 100 mg via ORAL
  Filled 2014-05-24 (×2): qty 2

## 2014-05-24 MED ORDER — ACETAMINOPHEN 10 MG/ML IV SOLN
1000.0000 mg | Freq: Once | INTRAVENOUS | Status: DC
  Filled 2014-05-24: qty 100

## 2014-05-24 MED ORDER — FENTANYL CITRATE 0.05 MG/ML IJ SOLN
INTRAMUSCULAR | Status: AC
  Filled 2014-05-24: qty 2

## 2014-05-24 MED ORDER — PROPOFOL 10 MG/ML IV BOLUS
INTRAVENOUS | Status: DC | PRN
  Administered 2014-05-24: 100 mg via INTRAVENOUS
  Administered 2014-05-24: 50 mg via INTRAVENOUS
  Administered 2014-05-24: 200 mg via INTRAVENOUS

## 2014-05-24 MED ORDER — DEXTROSE 5 % IV SOLN
500.0000 mg | Freq: Four times a day (QID) | INTRAVENOUS | Status: DC | PRN
  Filled 2014-05-24: qty 5

## 2014-05-24 MED ORDER — DEXAMETHASONE SODIUM PHOSPHATE 10 MG/ML IJ SOLN: 10.0000 mg | Freq: Once | INTRAMUSCULAR | Status: DC

## 2014-05-24 MED ORDER — ACETAMINOPHEN 10 MG/ML IV SOLN
INTRAVENOUS | Status: DC | PRN
  Administered 2014-05-24: 1000 mg via INTRAVENOUS

## 2014-05-24 MED ORDER — CLOPIDOGREL BISULFATE 75 MG PO TABS
75.0000 mg | ORAL_TABLET | Freq: Every day | ORAL | Status: DC
  Administered 2014-05-25: 75 mg via ORAL
  Filled 2014-05-24 (×2): qty 1

## 2014-05-24 MED ORDER — DEXAMETHASONE SODIUM PHOSPHATE 10 MG/ML IJ SOLN
INTRAMUSCULAR | Status: DC | PRN
  Administered 2014-05-24: 10 mg via INTRAVENOUS

## 2014-05-24 MED ORDER — ONDANSETRON HCL 4 MG/2ML IJ SOLN
INTRAMUSCULAR | Status: AC
  Filled 2014-05-24: qty 2

## 2014-05-24 MED ORDER — PANTOPRAZOLE SODIUM 40 MG PO TBEC
40.0000 mg | DELAYED_RELEASE_TABLET | Freq: Every day | ORAL | Status: DC
  Administered 2014-05-25: 40 mg via ORAL
  Filled 2014-05-24: qty 1

## 2014-05-24 MED ORDER — LACTATED RINGERS IV SOLN
INTRAVENOUS | Status: DC
  Administered 2014-05-24: 1000 mL via INTRAVENOUS

## 2014-05-24 MED ORDER — METFORMIN HCL 500 MG PO TABS
500.0000 mg | ORAL_TABLET | Freq: Two times a day (BID) | ORAL | Status: DC
  Administered 2014-05-25: 500 mg via ORAL
  Filled 2014-05-24 (×3): qty 1

## 2014-05-24 MED ORDER — PROMETHAZINE HCL 25 MG/ML IJ SOLN: 6.2500 mg | INTRAMUSCULAR | Status: DC | PRN

## 2014-05-24 MED ORDER — METOCLOPRAMIDE HCL 5 MG/ML IJ SOLN: 5.0000 mg | Freq: Three times a day (TID) | INTRAMUSCULAR | Status: DC | PRN

## 2014-05-24 MED ORDER — BUPIVACAINE HCL (PF) 0.25 % IJ SOLN
INTRAMUSCULAR | Status: AC
  Filled 2014-05-24: qty 30

## 2014-05-24 MED ORDER — OXYCODONE HCL 5 MG PO TABS: 5.0000 mg | ORAL_TABLET | Freq: Once | ORAL | Status: DC | PRN

## 2014-05-24 MED ORDER — HYDROMORPHONE HCL PF 1 MG/ML IJ SOLN
INTRAMUSCULAR | Status: AC
  Filled 2014-05-24: qty 1

## 2014-05-24 MED ORDER — SODIUM CHLORIDE 0.9 % IJ SOLN
INTRAMUSCULAR | Status: DC | PRN
  Administered 2014-05-24: 30 mL via INTRAVENOUS

## 2014-05-24 MED ORDER — BUPIVACAINE LIPOSOME 1.3 % IJ SUSP
INTRAMUSCULAR | Status: DC | PRN
  Administered 2014-05-24: 20 mL

## 2014-05-24 MED ORDER — CEFAZOLIN SODIUM-DEXTROSE 2-3 GM-% IV SOLR
2.0000 g | Freq: Four times a day (QID) | INTRAVENOUS | Status: DC
  Administered 2014-05-24 – 2014-05-25 (×2): 2 g via INTRAVENOUS
  Filled 2014-05-24 (×3): qty 50

## 2014-05-24 MED ORDER — MORPHINE SULFATE 10 MG/ML IJ SOLN: 1.0000 mg | INTRAMUSCULAR | Status: DC | PRN

## 2014-05-24 MED ORDER — ATORVASTATIN CALCIUM 80 MG PO TABS
80.0000 mg | ORAL_TABLET | Freq: Every day | ORAL | Status: DC
  Administered 2014-05-24: 80 mg via ORAL
  Filled 2014-05-24 (×2): qty 1

## 2014-05-24 MED ORDER — MIDAZOLAM HCL 5 MG/5ML IJ SOLN
INTRAMUSCULAR | Status: DC | PRN
  Administered 2014-05-24: 2 mg via INTRAVENOUS

## 2014-05-24 MED ORDER — METHOCARBAMOL 500 MG PO TABS
500.0000 mg | ORAL_TABLET | Freq: Four times a day (QID) | ORAL | Status: DC | PRN
Start: 2014-05-24 — End: 2014-05-25
  Administered 2014-05-24: 500 mg via ORAL
  Filled 2014-05-24: qty 1

## 2014-05-24 MED ORDER — SODIUM CHLORIDE 0.9 % IV SOLN
INTRAVENOUS | Status: DC
  Administered 2014-05-24: 19:00:00 via INTRAVENOUS

## 2014-05-24 MED ORDER — DIPHENHYDRAMINE HCL 25 MG PO CAPS
25.0000 mg | ORAL_CAPSULE | Freq: Four times a day (QID) | ORAL | Status: DC | PRN
  Administered 2014-05-24: 25 mg via ORAL
  Filled 2014-05-24: qty 1

## 2014-05-24 MED ORDER — SODIUM CHLORIDE 0.9 % IJ SOLN
INTRAMUSCULAR | Status: AC
  Filled 2014-05-24: qty 50

## 2014-05-24 MED ORDER — PROPOFOL 10 MG/ML IV BOLUS
INTRAVENOUS | Status: AC
  Filled 2014-05-24: qty 20

## 2014-05-24 MED ORDER — BUPIVACAINE HCL 0.25 % IJ SOLN
INTRAMUSCULAR | Status: DC | PRN
  Administered 2014-05-24: 30 mL

## 2014-05-24 MED ORDER — DEXAMETHASONE SODIUM PHOSPHATE 10 MG/ML IJ SOLN
INTRAMUSCULAR | Status: AC
  Filled 2014-05-24: qty 1

## 2014-05-24 MED ORDER — SODIUM CHLORIDE 0.9 % IV SOLN: INTRAVENOUS | Status: DC

## 2014-05-24 SURGICAL SUPPLY — 42 items
ANCHOR SUPER QUICK (Anchor) ×4 IMPLANT
BAG ZIPLOCK 12X15 (MISCELLANEOUS) IMPLANT
BIT DRILL 2.4X128 (BIT) IMPLANT
BLADE EXTENDED COATED 6.5IN (ELECTRODE) ×2 IMPLANT
DRAPE INCISE IOBAN 66X45 STRL (DRAPES) ×2 IMPLANT
DRAPE ORTHO SPLIT 77X108 STRL (DRAPES) ×2
DRAPE POUCH INSTRU U-SHP 10X18 (DRAPES) ×2 IMPLANT
DRAPE SURG ORHT 6 SPLT 77X108 (DRAPES) ×2 IMPLANT
DRAPE U-SHAPE 47X51 STRL (DRAPES) ×2 IMPLANT
DRSG ADAPTIC 3X8 NADH LF (GAUZE/BANDAGES/DRESSINGS) ×2 IMPLANT
DRSG EMULSION OIL 3X16 NADH (GAUZE/BANDAGES/DRESSINGS) ×2 IMPLANT
DRSG MEPILEX BORDER 4X4 (GAUZE/BANDAGES/DRESSINGS) ×2 IMPLANT
DRSG MEPILEX BORDER 4X8 (GAUZE/BANDAGES/DRESSINGS) ×2 IMPLANT
DURAPREP 26ML APPLICATOR (WOUND CARE) ×2 IMPLANT
ELECT REM PT RETURN 9FT ADLT (ELECTROSURGICAL) ×2
ELECTRODE REM PT RTRN 9FT ADLT (ELECTROSURGICAL) ×1 IMPLANT
EVACUATOR 1/8 PVC DRAIN (DRAIN) ×2 IMPLANT
GLOVE BIO SURGEON STRL SZ7.5 (GLOVE) ×2 IMPLANT
GLOVE BIO SURGEON STRL SZ8 (GLOVE) ×2 IMPLANT
GLOVE BIOGEL PI IND STRL 8 (GLOVE) ×2 IMPLANT
GLOVE BIOGEL PI INDICATOR 8 (GLOVE) ×2
GOWN STRL REUS W/TWL LRG LVL3 (GOWN DISPOSABLE) ×2 IMPLANT
GOWN STRL REUS W/TWL XL LVL3 (GOWN DISPOSABLE) ×2 IMPLANT
KIT BASIN OR (CUSTOM PROCEDURE TRAY) ×2 IMPLANT
MANIFOLD NEPTUNE II (INSTRUMENTS) ×2 IMPLANT
NDL SAFETY ECLIPSE 18X1.5 (NEEDLE) ×2 IMPLANT
NEEDLE HYPO 18GX1.5 SHARP (NEEDLE) ×2
NS IRRIG 1000ML POUR BTL (IV SOLUTION) ×2 IMPLANT
PACK TOTAL JOINT (CUSTOM PROCEDURE TRAY) ×2 IMPLANT
PASSER SUT SWANSON 36MM LOOP (INSTRUMENTS) IMPLANT
POSITIONER SURGICAL ARM (MISCELLANEOUS) ×2 IMPLANT
SPONGE GAUZE 4X4 12PLY (GAUZE/BANDAGES/DRESSINGS) ×2 IMPLANT
STRIP CLOSURE SKIN 1/2X4 (GAUZE/BANDAGES/DRESSINGS) ×2 IMPLANT
SUT ETHIBOND NAB CT1 #1 30IN (SUTURE) IMPLANT
SUT MNCRL AB 4-0 PS2 18 (SUTURE) ×2 IMPLANT
SUT VIC AB 1 CT1 27 (SUTURE) ×2
SUT VIC AB 1 CT1 27XBRD ANTBC (SUTURE) ×2 IMPLANT
SUT VIC AB 2-0 CT1 27 (SUTURE) ×2
SUT VIC AB 2-0 CT1 TAPERPNT 27 (SUTURE) ×2 IMPLANT
SYR 20CC LL (SYRINGE) ×2 IMPLANT
SYR 50ML LL SCALE MARK (SYRINGE) ×2 IMPLANT
TOWEL OR 17X26 10 PK STRL BLUE (TOWEL DISPOSABLE) ×2 IMPLANT

## 2014-05-24 NOTE — Transfer of Care (Signed)
Immediate Anesthesia Transfer of Care Note  Patient: Miguel Brock  Procedure(s) Performed: Procedure(s) (LRB): LEFT HIP BURSECTOMY WITH GLUTEAL TENDON REPAIR     (Left)  Patient Location: PACU  Anesthesia Type: General  Level of Consciousness: sedated, patient cooperative and responds to stimulation  Airway & Oxygen Therapy: Patient Spontanous Breathing and Patient connected to face mask oxgen  Post-op Assessment: Report given to PACU RN and Post -op Vital signs reviewed and stable  Post vital signs: Reviewed and stable  Complications: No apparent anesthesia complications

## 2014-05-24 NOTE — Anesthesia Preprocedure Evaluation (Addendum)
Anesthesia Evaluation  Patient identified by MRN, date of birth, ID band Patient awake    Reviewed: Allergy & Precautions, H&P , NPO status , Patient's Chart, lab work & pertinent test results, reviewed documented beta blocker date and time   Airway Mallampati: II TM Distance: >3 FB Neck ROM: full    Dental  (+) Teeth Intact, Dental Advisory Given   Pulmonary pneumonia -, resolved, former smoker,  breath sounds clear to auscultation        Cardiovascular hypertension, On Medications, On Home Beta Blockers and Pt. on medications + angina + CAD and + Cardiac Stents Rhythm:regular     Neuro/Psych PSYCHIATRIC DISORDERS Depression negative neurological ROS     GI/Hepatic hiatal hernia, GERD-  Medicated and Controlled,(+) Hepatitis -, Unspecified  Endo/Other  diabetes, Type 2, Oral Hypoglycemic Agents  Renal/GU negative Renal ROS  negative genitourinary   Musculoskeletal   Abdominal   Peds  Hematology negative hematology ROS (+)   Anesthesia Other Findings See surgeon's H&P   Reproductive/Obstetrics negative OB ROS                          Anesthesia Physical  Anesthesia Plan  ASA: IV  Anesthesia Plan: General   Post-op Pain Management:    Induction: Intravenous  Airway Management Planned: LMA  Additional Equipment:   Intra-op Plan:   Post-operative Plan: Extubation in OR  Informed Consent: I have reviewed the patients History and Physical, chart, labs and discussed the procedure including the risks, benefits and alternatives for the proposed anesthesia with the patient or authorized representative who has indicated his/her understanding and acceptance.   Dental advisory given  Plan Discussed with: CRNA  Anesthesia Plan Comments: (Pt on ASA and Plavix as he had a drug eluting stent placed 12/2013. Discussed the risk of in stent thrombosis and sudden cardiac death. Pt understands and  accepts)       Anesthesia Quick Evaluation

## 2014-05-24 NOTE — Anesthesia Postprocedure Evaluation (Signed)
Anesthesia Post Note  Patient: Miguel Brock  Procedure(s) Performed: Procedure(s) (LRB): LEFT HIP BURSECTOMY WITH GLUTEAL TENDON REPAIR     (Left)  Anesthesia type: General  Patient location: PACU  Post pain: Pain level controlled  Post assessment: Post-op Vital signs reviewed  Last Vitals: BP 147/82  Pulse 69  Temp(Src) 36.4 C (Oral)  Resp 16  SpO2 95%  Post vital signs: Reviewed  Level of consciousness: sedated  Complications: No apparent anesthesia complicati

## 2014-05-24 NOTE — H&P (Signed)
CC- Miguel Brock is a 68 y.o. male who presents with left hip pain  Hip Pain: Patient complains of left hip pain. Onset of the symptoms was several months ago. Inciting event: none. Current symptoms include lateral hip pain which radiates down his lateral thigh. Associated symptoms: none. Aggravating symptoms: lying on left side. Patient has had no prior hip problems.  Evaluation to date: MRI showing gluteus medius tear.  Treatment to date: injection which provided minimal relief.  Past Medical History  Diagnosis Date  . Anginal pain     occ  . Hypertension   . Pneumonia     hx  . GERD (gastroesophageal reflux disease)     zantac  . H/O hiatal hernia   . Hepatitis 70's    food   . Diabetes mellitus without complication     TYPE 2  . Bursitis of left hip   . Depression   . PTSD (post-traumatic stress disorder)   . Coronary artery disease 11,15    stents x7    Past Surgical History  Procedure Laterality Date  . Elbow arthroscopy Right 89    ulner nerve rel  . Shoulder arthroscopy Left     release of tendon  . Thrumb Right   . Hernia repair Right 68  . Anterior fusion lumbar spine      L5 S1    07/07/2013      Dr Rolena Infante  . Anterior lumbar fusion N/A 07/07/2013    Procedure: ANTERIOR LUMBAR FUSION 1 LEVEL ALIF L5-S1;  Surgeon: Melina Schools, MD;  Location: [facility];  Service: Orthopedics;  Laterality: N/A;  . Abdominal exposure N/A 07/07/2013    Procedure: ABDOMINAL EXPOSURE FOR ANTERIOR LUMBAR FUSION;  Surgeon: Rosetta Posner, MD;  Location: [facility];  Service: Vascular;  Laterality: N/A;  . Coronary angioplasty  01/11/2014    LAD & CIRCUMFLEX  . Esophagogastroduodenoscopy N/A 03/11/2014    Procedure: ESOPHAGOGASTRODUODENOSCOPY (EGD);  Surgeon: Winfield Cunas., MD;  Location: [facility] ENDOSCOPY;  Service: Endoscopy;  Laterality: N/A;  . Colonoscopy N/A 03/12/2014    Procedure: COLONOSCOPY;  Surgeon: Arta Silence, MD;  Location: [facility] ENDOSCOPY;  Service: Endoscopy;  Laterality: N/A;     Prior to Admission medications   Medication Sig Start Date End Date Taking? Authorizing Provider  aspirin EC 81 MG tablet Take 81 mg by mouth daily.    Historical Provider, MD  atorvastatin (LIPITOR) 80 MG tablet Take 80 mg by mouth daily with supper.     Historical Provider, MD  clopidogrel (PLAVIX) 75 MG tablet Take 1 tablet (75 mg total) by mouth daily with breakfast. 03/13/14   Precious Reel, MD  diphenhydrAMINE (BENADRYL) 25 MG tablet Take 25 mg by mouth every 6 (six) hours as needed for allergies. 07/24/13   Kalman Drape, MD  EPINEPHrine (EPIPEN) 0.3 mg/0.3 mL SOAJ Inject 0.3 mLs (0.3 mg total) into the muscle once. 07/24/13   Kalman Drape, MD  hydrochlorothiazide (HYDRODIURIL) 25 MG tablet Take 1 tablet (25 mg total) by mouth every morning. 03/21/14   Precious Reel, MD  losartan (COZAAR) 100 MG tablet Take 100 mg by mouth every morning.     Historical Provider, MD  metFORMIN (GLUCOPHAGE) 500 MG tablet Take 500 mg by mouth 2 (two) times daily with a meal.    Historical Provider, MD  methocarbamol (ROBAXIN) 500 MG tablet Take 500 mg by mouth 3 (three) times daily as needed for muscle spasms.  07/09/13   Melina Schools,  MD  metoprolol succinate (TOPROL-XL) 100 MG 24 hr tablet Take 100 mg by mouth every morning. Take with or immediately following a meal.    Historical Provider, MD  nitroGLYCERIN (NITROSTAT) 0.4 MG SL tablet Place 0.4 mg under the tongue every 5 (five) minutes as needed for chest pain.    Historical Provider, MD  pantoprazole (PROTONIX) 40 MG tablet Take 1 tablet (40 mg total) by mouth daily. 03/12/14   Precious Reel, MD    Physical Examination: General appearance - alert, well appearing, and in no distress Mental status - alert, oriented to person, place, and time Chest - clear to auscultation, no wheezes, rales or rhonchi, symmetric air entry Heart - normal rate, regular rhythm, normal S1, S2, no murmurs, rubs, clicks or gallops Abdomen - soft, nontender, nondistended, no masses  or organomegaly Neurological - alert, oriented, normal speech, no focal findings or movement disorder noted  A left hip exam was performed. SKIN: intact SWELLING: none WARMTH: no warmth TENDERNESS: maximal at greater trochanter ROM: normal STRENGTH: normal except 4/5 abduction GAIT: antalgic  ASSESSMENT:Intractable left hip pain with gluteus medius tear  Plan: Left hip bursectomy with gluteal tendon repair. Discussed procedure, risks potential complications and rehab course with patient who elects to proceed.  Dione Plover Malajah Oceguera, MD    05/24/2014, 7:28 AM

## 2014-05-24 NOTE — Brief Op Note (Signed)
05/24/2014  5:32 PM  PATIENT:  Miguel Brock  68 y.o. male  PRE-OPERATIVE DIAGNOSIS:  LEFT HIP BURSITIS WITH GLUTEAL TENDON TEAR  POST-OPERATIVE DIAGNOSIS:  LEFT HIP BURSITIS WITH GLUTEAL TENDON TEAR  PROCEDURE:  Procedure(s): LEFT HIP BURSECTOMY WITH GLUTEAL TENDON REPAIR     (Left)  SURGEON:  Surgeon(s) and Role:    * Gearlean Alf, MD - Primary  PHYSICIAN ASSISTANT:   ASSISTANTS: Arlee Muslim, PA-C   ANESTHESIA:   general  EBL:     BLOOD ADMINISTERED:none  DRAINS: (Medium) Hemovact drain(s) in the left hip with  Suction Open   LOCAL MEDICATIONS USED:  OTHER Exparel  COUNTS:  YES  TOURNIQUET:  * No tourniquets in log *  DICTATION: .Other Dictation: Dictation Number [id number]  PLAN OF CARE: Admit for overnight observation  PATIENT DISPOSITION:  PACU - hemodynamically stable.

## 2014-05-25 ENCOUNTER — Encounter (HOSPITAL_COMMUNITY): Payer: Self-pay | Admitting: Orthopedic Surgery

## 2014-05-25 DIAGNOSIS — M76899 Other specified enthesopathies of unspecified lower limb, excluding foot: Secondary | ICD-10-CM | POA: Diagnosis not present

## 2014-05-25 LAB — GLUCOSE, CAPILLARY
GLUCOSE-CAPILLARY: 266 mg/dL — AB (ref 70–99)
GLUCOSE-CAPILLARY: 159 mg/dL — AB (ref 70–99)
Glucose-Capillary: 112 mg/dL — ABNORMAL HIGH (ref 70–99)

## 2014-05-25 MED ORDER — METHOCARBAMOL 500 MG PO TABS: 500.0000 mg | ORAL_TABLET | Freq: Four times a day (QID) | ORAL | Status: DC | PRN

## 2014-05-25 MED ORDER — OXYCODONE HCL 5 MG PO TABS: 5.0000 mg | ORAL_TABLET | ORAL | Status: DC | PRN

## 2014-05-25 NOTE — Progress Notes (Signed)
   Subjective: 1 Day Post-Op Procedure(s) (LRB): LEFT HIP BURSECTOMY WITH GLUTEAL TENDON REPAIR     (Left) Patient reports pain as mild.   Patient seen in rounds for Dr. Wynelle Link. Patient is well, and has had no acute complaints or problems Patient is ready to go home  Objective: Vital signs in last 24 hours: Temp:  [97.5 F (36.4 C)-98.2 F (36.8 C)] 97.8 F (36.6 C) (05/27 0704) Pulse Rate:  [58-71] 71 (05/27 0704) Resp:  [9-19] 16 (05/27 0704) BP: (130-169)/(73-90) 130/82 mmHg (05/27 0704) SpO2:  [92 %-100 %] 93 % (05/27 0704) Weight:  [97 kg (213 lb 13.5 oz)] 97 kg (213 lb 13.5 oz) (05/26 2045)  Intake/Output from previous day:  Intake/Output Summary (Last 24 hours) at 05/25/14 0910 Last data filed at 05/25/14 1324  Gross per 24 hour  Intake 2146.67 ml  Output   1140 ml  Net 1006.67 ml    Intake/Output this shift: Total I/O In: 180 [P.O.:180] Out: 30 [Drains:30]  Labs: No results found for this basename: HGB,  in the last 72 hours No results found for this basename: WBC, RBC, HCT, PLT,  in the last 72 hours No results found for this basename: NA, K, CL, CO2, BUN, CREATININE, GLUCOSE, CALCIUM,  in the last 72 hours No results found for this basename: LABPT, INR,  in the last 72 hours  EXAM: General - Patient is Alert, Appropriate and Oriented Extremity - Neurovascular intact Sensation intact distally Dressing - clean, dry, no drainage Motor Function - intact, moving foot and toes well on exam.  Hemovac pulled without diffiuclty  Assessment/Plan: 1 Day Post-Op Procedure(s) (LRB): LEFT HIP BURSECTOMY WITH GLUTEAL TENDON REPAIR     (Left) Procedure(s) (LRB): LEFT HIP BURSECTOMY WITH GLUTEAL TENDON REPAIR     (Left) Past Medical History  Diagnosis Date  . Anginal pain     occ  . Hypertension   . Pneumonia     hx  . GERD (gastroesophageal reflux disease)     zantac  . H/O hiatal hernia   . Hepatitis 70's    food   . Diabetes mellitus without complication      TYPE 2  . Bursitis of left hip   . Depression   . PTSD (post-traumatic stress disorder)   . Coronary artery disease 11,15    stents x7   Active Problems:   Trochanteric bursitis of left hip  Estimated body mass index is 29.84 kg/(m^2) as calculated from the following:   Height as of this encounter: 5\' 11"  (1.803 m).   Weight as of this encounter: 97 kg (213 lb 13.5 oz). Discharge home Diet - Cardiac diet and Diabetic diet Follow up - in 2 weeks Activity - WBAT, NO ABDUCTION of the left hip Disposition - Home Condition Upon Discharge - Good D/C Meds - See DC Summary DVT Prophylaxis - He has resuemed his Plavix at home  Arlee Muslim, PA-C Orthopaedic Surgery 05/25/2014, 9:10 AM

## 2014-05-25 NOTE — Discharge Summary (Signed)
Physician Discharge Summary   Patient ID: Miguel Brock MRN: [id number] DOB/AGE: 1946-12-21 68 y.o.  Admit date: 05/24/2014 Discharge date: 05/25/2014  Primary Diagnosis:  [place] TEAR  Admission Diagnoses:  Past Medical History  Diagnosis Date  . Anginal pain     occ  . Hypertension   . Pneumonia     hx  . GERD (gastroesophageal reflux disease)     zantac  . H/O hiatal hernia   . Hepatitis 70's    food   . Diabetes mellitus without complication     TYPE 2  . Bursitis of left hip   . Depression   . PTSD (post-traumatic stress disorder)   . Coronary artery disease 11,15    stents x7   Discharge Diagnoses:   Active Problems:   Trochanteric bursitis of left hip  Estimated body mass index is 29.84 kg/(m^2) as calculated from the following:   Height as of this encounter: _0  (1.803 m).   Weight as of this encounter: 97 kg (213 lb 13.5 oz).  Procedure(s) (LRB): LEFT HIP BURSECTOMY WITH GLUTEAL TENDON REPAIR     (Left)   Consults: None  HPI: Miguel Brock is a 68 year old male with intractable left  hip pain and dysfunction. It has been going on for many months now.  Exam and history were consistent with bursitis. Did not respond to  injection and MRI showed a significant tear of the gluteus medius  tendon. He presents now for bursectomy and gluteal tendon repair.  Laboratory Data: Admission on 05/24/2014, Discharged on 05/25/2014  Component Date Value Ref Range Status  . Glucose-Capillary 05/24/2014 109* 70 - 99 mg/dL Final  . Comment 1 05/24/2014 Documented in Chart   Final  . Comment 2 05/24/2014 Notify RN   Final  . Glucose-Capillary 05/24/2014 112* 70 - 99 mg/dL Final  . Glucose-Capillary 05/24/2014 266* 70 - 99 mg/dL Final  . Glucose-Capillary 05/25/2014 159* 70 - 99 mg/dL Final  Hospital Outpatient Visit on 05/16/2014  Component Date Value Ref Range Status  . Sodium 05/16/2014 138  137 - 147 mEq/L Final  . Potassium 05/16/2014 4.0   3.7 - 5.3 mEq/L Final  . Chloride 05/16/2014 96  96 - 112 mEq/L Final  . CO2 05/16/2014 26  19 - 32 mEq/L Final  . Glucose, Bld 05/16/2014 163* 70 - 99 mg/dL Final  . BUN 05/16/2014 28* 6 - 23 mg/dL Final  . Creatinine, Ser 05/16/2014 1.47* 0.50 - 1.35 mg/dL Final  . Calcium 05/16/2014 9.9  8.4 - 10.5 mg/dL Final  . Total Protein 05/16/2014 7.7  6.0 - 8.3 g/dL Final  . Albumin 05/16/2014 4.1  3.5 - 5.2 g/dL Final  . AST 05/16/2014 34  0 - 37 U/L Final  . ALT 05/16/2014 34  0 - 53 U/L Final  . Alkaline Phosphatase 05/16/2014 84  39 - 117 U/L Final  . Total Bilirubin 05/16/2014 0.6  0.3 - 1.2 mg/dL Final  . GFR calc non Af Amer 05/16/2014 47* >90 mL/min Final  . GFR calc Af Amer 05/16/2014 55* >90 mL/min Final   Comment: (NOTE)                          The eGFR has been calculated using the CKD EPI equation.                          This calculation has not  been validated in all clinical situations.                          eGFR's persistently <90 mL/min signify possible Chronic Kidney                          Disease.  . WBC 05/16/2014 8.0  4.0 - 10.5 K/uL Final  . RBC 05/16/2014 4.38  4.22 - 5.81 MIL/uL Final  . Hemoglobin 05/16/2014 14.0  13.0 - 17.0 g/dL Final  . HCT 05/16/2014 40.8  39.0 - 52.0 % Final  . MCV 05/16/2014 93.2  78.0 - 100.0 fL Final  . MCH 05/16/2014 32.0  26.0 - 34.0 pg Final  . MCHC 05/16/2014 34.3  30.0 - 36.0 g/dL Final  . RDW 05/16/2014 12.6  11.5 - 15.5 % Final  . Platelets 05/16/2014 237  150 - 400 K/uL Final  . MRSA, PCR 05/16/2014 POSITIVE* NEGATIVE Final  . Staphylococcus aureus 05/16/2014 POSITIVE* NEGATIVE Final   Comment:                                 The Xpert SA Assay (FDA                          approved for NASAL specimens                          in patients over 58 years of age),                          is one component of                          a comprehensive surveillance                          program.  Test performance has                           been validated by [employer] for patients greater                          than or equal to 58 year old.                          It is not intended                          to diagnose infection nor to                          guide or monitor treatment.     X-Rays:Dg Chest 2 View  05/16/2014   CLINICAL DATA:  Preop for left hip surgery  EXAM: CHEST  2 VIEW  COMPARISON:  10/15/2012  FINDINGS: Cardiomediastinal silhouette is stable. No acute infiltrate or pleural effusion. No pulmonary edema. Mild degenerative changes thoracic spine.  IMPRESSION: No  acute infiltrate or pulmonary edema. Mild degenerative changes thoracic spine.   Electronically Signed   By: Lahoma Crocker M.D.   On: 05/16/2014 14:42    EKG: Orders placed during the hospital encounter of 03/10/14  . EKG 12-LEAD  . EKG 12-LEAD  . EKG     Hospital Course: Patient was admitted to [facility] and taken to the OR and underwent the above state procedure without complications.  Patient tolerated the procedure well and was later transferred to the recovery room and then to the orthopaedic floor for postoperative care.  They were given PO and IV analgesics for pain control following their surgery.  They were given 24 hours of postoperative antibiotics of  Anti-infectives   Start     Dose/Rate Route Frequency Ordered Stop   05/24/14 2300  ceFAZolin (ANCEF) IVPB 2 g/50 mL premix  Status:  Discontinued     2 g 100 mL/hr over 30 Minutes Intravenous Every 6 hours 05/24/14 1855 05/25/14 1457   05/24/14 1430  ceFAZolin (ANCEF) IVPB 2 g/50 mL premix     2 g 100 mL/hr over 30 Minutes Intravenous On call to O.R. 05/24/14 1339 05/24/14 1635     and started on DVT prophylaxis in the form of Aspirin and Plavix which he has taking at home.   PT and OT were ordered for total hip protocol.  The patient was allowed to be WBAT with therapy. Discharge planning was consulted to help with postop  disposition and equipment needs.  Patient had a decent night on the evening of surgery.  They started to get up OOB with therapy on day one.  Patient was seen in rounds and doing well and was ready to go home.  Discharge home  Diet - Cardiac diet and Diabetic diet  Follow up - in 2 weeks  Activity - WBAT, NO ABDUCTION of the left hip  Disposition - Home  Condition Upon Discharge - Good  D/C Meds - See DC Summary  DVT Prophylaxis - He has resuemed his Plavix at home       Discharge Instructions   Call MD / Call 911    Complete by:  As directed   If you experience chest pain or shortness of breath, CALL 911 and be transported to the hospital emergency room.  If you develope a fever above 101 F, pus (white drainage) or increased drainage or redness at the wound, or calf pain, call your surgeon's office.     Change dressing    Complete by:  As directed   Change dressing daily with sterile 4 x 4 inch gauze dressing and apply TED hose. Do not submerge the incision under water.     Constipation Prevention    Complete by:  As directed   Drink plenty of fluids.  Prune juice may be helpful.  You may use a stool softener, such as Colace (over the counter) 100 mg twice a day.  Use MiraLax (over the counter) for constipation as needed.     Diet - low sodium heart healthy    Complete by:  As directed      Diet Carb Modified    Complete by:  As directed      Discharge instructions    Complete by:  As directed   Pick up stool softner and laxative for home. Do not submerge incision under water. May shower. Continue to use ice for pain and swelling from surgery. He will resume his Plavix and Aspirin  at home.  Up walking as tolerated. Do not move hip out to the side away from the body - Abduction of the hip.     Do not sit on low chairs, stoools or toilet seats, as it may be difficult to get up from low surfaces    Complete by:  As directed      Driving restrictions    Complete by:  As directed     No driving until released by the physician.     Increase activity slowly as tolerated    Complete by:  As directed      Lifting restrictions    Complete by:  As directed   No lifting until released by the physician.     Patient may shower    Complete by:  As directed   You may shower without a dressing once there is no drainage.  Do not wash over the wound.  If drainage remains, do not shower until drainage stops.     TED hose    Complete by:  As directed   Use stockings (TED hose) for 3 weeks on both leg(s).  You may remove them at night for sleeping.     TED hose    Complete by:  As directed   Use stockings (TED hose) for 3 weeks on both leg(s).  You may remove them at night for sleeping.     Weight bearing as tolerated    Complete by:  As directed             Medication List         aspirin EC 81 MG tablet  Take 81 mg by mouth daily.     atorvastatin 80 MG tablet  Commonly known as:  LIPITOR  Take 80 mg by mouth daily with supper.     clopidogrel 75 MG tablet  Commonly known as:  PLAVIX  Take 1 tablet (75 mg total) by mouth daily with breakfast.     diphenhydrAMINE 25 MG tablet  Commonly known as:  BENADRYL  Take 25 mg by mouth every 6 (six) hours as needed for allergies.     EPINEPHrine 0.3 mg/0.3 mL Soaj injection  Commonly known as:  EPIPEN  Inject 0.3 mLs (0.3 mg total) into the muscle once.     hydrochlorothiazide 25 MG tablet  Commonly known as:  HYDRODIURIL  Take 1 tablet (25 mg total) by mouth every morning.     losartan 100 MG tablet  Commonly known as:  COZAAR  Take 100 mg by mouth every morning.     metFORMIN 500 MG tablet  Commonly known as:  GLUCOPHAGE  Take 500 mg by mouth 2 (two) times daily with a meal.     methocarbamol 500 MG tablet  Commonly known as:  ROBAXIN  Take 1 tablet (500 mg total) by mouth every 6 (six) hours as needed for muscle spasms.     metoprolol succinate 100 MG 24 hr tablet  Commonly known as:  TOPROL-XL  Take 100  mg by mouth every morning. Take with or immediately following a meal.     nitroGLYCERIN 0.4 MG SL tablet  Commonly known as:  NITROSTAT  Place 0.4 mg under the tongue every 5 (five) minutes as needed for chest pain.     oxyCODONE 5 MG immediate release tablet  Commonly known as:  Oxy IR/ROXICODONE  Take 1-2 tablets (5-10 mg total) by mouth every 3 (three) hours as needed for moderate pain, severe pain  or breakthrough pain.     pantoprazole 40 MG tablet  Commonly known as:  PROTONIX  Take 1 tablet (40 mg total) by mouth daily.       Follow-up Information   Follow up with Gearlean Alf, MD. Schedule an appointment as soon as possible for a visit in 2 weeks.   Specialty:  Orthopedic Surgery   Contact information:   [address] [place] [address] [phone]       Signed: Arlee Muslim, PA-C Orthopaedic Surgery 06/03/2014, 12:59 PM

## 2014-05-25 NOTE — Discharge Instructions (Signed)
Pick up stool softner and laxative for home. Do not submerge incision under water. May shower. Continue to use ice for pain and swelling from surgery. Resume Plavix and Aspirin at home.

## 2014-05-25 NOTE — Op Note (Signed)
NAMECLAIR, ALFIERI.:  [id number]  MEDICAL RECORD NO.:  [id number]  LOCATION:  5462                         FACILITY:  [facility]  PHYSICIAN:  Gaynelle Arabian, M.D.    DATE OF BIRTH:  30-Jul-1946  DATE OF PROCEDURE:  05/24/2014 DATE OF DISCHARGE:                              OPERATIVE REPORT   PREOPERATIVE DIAGNOSIS:  Left hip intractable bursitis with gluteal tendon tear.  POSTOPERATIVE DIAGNOSIS:  Left hip intractable bursitis with gluteal tendon tear.  PROCEDURE:  Left hip bursectomy with repair of gluteus medius tendon tear.  SURGEON:  Gaynelle Arabian, MD  ASSISTANT:  Alexzandrew L. Perkins, PA-C.  ANESTHESIA:  General.  ESTIMATED BLOOD LOSS:  Minimal.  DRAINS:  Hemovac x1.  COMPLICATIONS:  None.  CONDITION:  Stable to recovery.  BRIEF CLINICAL NOTE:  Miguel Brock is a 68 year old male with intractable left hip pain and dysfunction.  It has been going on for many months now. Exam and history were consistent with bursitis.  Did not respond to injection and MRI showed a significant tear of the gluteus medius tendon.  He presents now for bursectomy and gluteal tendon repair.  PROCEDURE IN DETAIL:  After successful administration of general anesthetic, the patient was placed in the right lateral decubitus position with the left side up, and held with the hip positioner.  The left lower extremity was isolated from his perineum with plastic drapes and prepped and draped in the usual sterile fashion.  A short and lateral based incision was then made, centered over the tip of the greater trochanter.  The skin was cut with a 10 blade through the subcutaneous tissue to the fascia lata which was incised in line with the skin incision.  There was a large bursa which was filled with fluid. This was resected with electrocautery.  The tendon was identified as a large tear in the entire mid and posterior half of the gluteus medius tendon with retraction.  We freshened  the edges of the tendon and with a knife and then I created a trough in the greater trochanter with a rongeur.  The Mitek anchors x2 were placed into the trough and then the sutures were placed through the tendon and the tendon was advanced down into the trough and oversewn with the Ethibond suture.  This was felt to be very stable repair.  I placed the hip through a range of motion of repair and remained intact.  The wound was then copiously irrigated with saline solution and the gluteal muscles, fascia lata, and subcu tissues were injected with total of 20 mL of Exparel mixed with 30 mL of saline and then additional 20 mL of 0.25% Marcaine was injected into the same tissues.  The fascia lata was then closed over Hemovac drain with #1 Vicryl suture.  A small triangular piece of tissue was removed over the tip of the greater trochanter, so as to prevent friction over the trochanter.  The subcu was then closed with #1 and 2-0 Vicryl and subcuticular running 4-0 Monocryl.  The incision was then cleaned and dried and Steri-Strips and a bulky sterile dressing applied.  He was then awakened and transported to recovery in stable  condition.  Note that, a surgical assistant was necessary for this procedure to provide retraction, appropriate tissues and to allow for appropriate tension to repair of the gluteus medius tendon.     Gaynelle Arabian, M.D.     FA/MEDQ  D:  05/24/2014  T:  05/25/2014  Job:  [id number]

## 2014-06-14 DIAGNOSIS — Z125 Encounter for screening for malignant neoplasm of prostate: Secondary | ICD-10-CM | POA: Diagnosis not present

## 2014-06-14 DIAGNOSIS — E119 Type 2 diabetes mellitus without complications: Secondary | ICD-10-CM | POA: Diagnosis not present

## 2014-06-14 DIAGNOSIS — E0590 Thyrotoxicosis, unspecified without thyrotoxic crisis or storm: Secondary | ICD-10-CM | POA: Diagnosis not present

## 2014-06-14 DIAGNOSIS — I2510 Atherosclerotic heart disease of native coronary artery without angina pectoris: Secondary | ICD-10-CM | POA: Diagnosis not present

## 2014-06-14 DIAGNOSIS — E785 Hyperlipidemia, unspecified: Secondary | ICD-10-CM | POA: Diagnosis not present

## 2014-06-14 DIAGNOSIS — I10 Essential (primary) hypertension: Secondary | ICD-10-CM | POA: Diagnosis not present

## 2014-06-17 DIAGNOSIS — E079 Disorder of thyroid, unspecified: Secondary | ICD-10-CM | POA: Diagnosis not present

## 2014-06-17 DIAGNOSIS — M25559 Pain in unspecified hip: Secondary | ICD-10-CM | POA: Diagnosis not present

## 2014-06-17 DIAGNOSIS — I10 Essential (primary) hypertension: Secondary | ICD-10-CM | POA: Diagnosis not present

## 2014-06-17 DIAGNOSIS — Z1331 Encounter for screening for depression: Secondary | ICD-10-CM | POA: Diagnosis not present

## 2014-06-17 DIAGNOSIS — E785 Hyperlipidemia, unspecified: Secondary | ICD-10-CM | POA: Diagnosis not present

## 2014-06-17 DIAGNOSIS — E119 Type 2 diabetes mellitus without complications: Secondary | ICD-10-CM | POA: Diagnosis not present

## 2014-06-17 DIAGNOSIS — K219 Gastro-esophageal reflux disease without esophagitis: Secondary | ICD-10-CM | POA: Diagnosis not present

## 2014-06-17 DIAGNOSIS — Z23 Encounter for immunization: Secondary | ICD-10-CM | POA: Diagnosis not present

## 2014-06-17 DIAGNOSIS — M48061 Spinal stenosis, lumbar region without neurogenic claudication: Secondary | ICD-10-CM | POA: Diagnosis not present

## 2014-06-28 DIAGNOSIS — E119 Type 2 diabetes mellitus without complications: Secondary | ICD-10-CM | POA: Diagnosis not present

## 2014-06-28 DIAGNOSIS — H2510 Age-related nuclear cataract, unspecified eye: Secondary | ICD-10-CM | POA: Diagnosis not present

## 2014-07-14 DIAGNOSIS — I6529 Occlusion and stenosis of unspecified carotid artery: Secondary | ICD-10-CM | POA: Diagnosis not present

## 2014-07-20 DIAGNOSIS — I2510 Atherosclerotic heart disease of native coronary artery without angina pectoris: Secondary | ICD-10-CM | POA: Diagnosis not present

## 2014-07-20 DIAGNOSIS — I6529 Occlusion and stenosis of unspecified carotid artery: Secondary | ICD-10-CM | POA: Diagnosis not present

## 2014-07-20 DIAGNOSIS — E7800 Pure hypercholesterolemia, unspecified: Secondary | ICD-10-CM | POA: Diagnosis not present

## 2014-07-20 DIAGNOSIS — E119 Type 2 diabetes mellitus without complications: Secondary | ICD-10-CM | POA: Diagnosis not present

## 2014-07-28 DIAGNOSIS — Z125 Encounter for screening for malignant neoplasm of prostate: Secondary | ICD-10-CM | POA: Diagnosis not present

## 2014-08-08 DIAGNOSIS — M461 Sacroiliitis, not elsewhere classified: Secondary | ICD-10-CM | POA: Diagnosis not present

## 2014-08-08 DIAGNOSIS — Z981 Arthrodesis status: Secondary | ICD-10-CM | POA: Diagnosis not present

## 2014-08-30 DIAGNOSIS — M461 Sacroiliitis, not elsewhere classified: Secondary | ICD-10-CM | POA: Diagnosis not present

## 2014-09-15 DIAGNOSIS — M461 Sacroiliitis, not elsewhere classified: Secondary | ICD-10-CM | POA: Diagnosis not present

## 2014-09-15 DIAGNOSIS — IMO0002 Reserved for concepts with insufficient information to code with codable children: Secondary | ICD-10-CM | POA: Diagnosis not present

## 2014-09-15 DIAGNOSIS — Z981 Arthrodesis status: Secondary | ICD-10-CM | POA: Diagnosis not present

## 2014-09-19 DIAGNOSIS — M461 Sacroiliitis, not elsewhere classified: Secondary | ICD-10-CM | POA: Diagnosis not present

## 2014-09-19 DIAGNOSIS — M79609 Pain in unspecified limb: Secondary | ICD-10-CM | POA: Diagnosis not present

## 2014-10-17 DIAGNOSIS — E785 Hyperlipidemia, unspecified: Secondary | ICD-10-CM | POA: Diagnosis not present

## 2014-10-17 DIAGNOSIS — Z23 Encounter for immunization: Secondary | ICD-10-CM | POA: Diagnosis not present

## 2014-10-17 DIAGNOSIS — I6529 Occlusion and stenosis of unspecified carotid artery: Secondary | ICD-10-CM | POA: Diagnosis not present

## 2014-10-17 DIAGNOSIS — J329 Chronic sinusitis, unspecified: Secondary | ICD-10-CM | POA: Diagnosis not present

## 2014-10-17 DIAGNOSIS — E119 Type 2 diabetes mellitus without complications: Secondary | ICD-10-CM | POA: Diagnosis not present

## 2014-10-17 DIAGNOSIS — I10 Essential (primary) hypertension: Secondary | ICD-10-CM | POA: Diagnosis not present

## 2014-10-17 DIAGNOSIS — M19071 Primary osteoarthritis, right ankle and foot: Secondary | ICD-10-CM | POA: Diagnosis not present

## 2014-10-17 DIAGNOSIS — M79676 Pain in unspecified toe(s): Secondary | ICD-10-CM | POA: Diagnosis not present

## 2014-10-17 DIAGNOSIS — Z6830 Body mass index (BMI) 30.0-30.9, adult: Secondary | ICD-10-CM | POA: Diagnosis not present

## 2014-10-20 DIAGNOSIS — M25511 Pain in right shoulder: Secondary | ICD-10-CM | POA: Diagnosis not present

## 2014-10-24 DIAGNOSIS — M79605 Pain in left leg: Secondary | ICD-10-CM | POA: Diagnosis not present

## 2014-10-29 DIAGNOSIS — M25511 Pain in right shoulder: Secondary | ICD-10-CM | POA: Diagnosis not present

## 2014-10-30 DIAGNOSIS — I2510 Atherosclerotic heart disease of native coronary artery without angina pectoris: Secondary | ICD-10-CM

## 2014-10-30 HISTORY — DX: Atherosclerotic heart disease of native coronary artery without angina pectoris: I25.10

## 2014-11-08 DIAGNOSIS — S43431D Superior glenoid labrum lesion of right shoulder, subsequent encounter: Secondary | ICD-10-CM | POA: Diagnosis not present

## 2014-11-08 DIAGNOSIS — M25811 Other specified joint disorders, right shoulder: Secondary | ICD-10-CM | POA: Diagnosis not present

## 2014-11-10 DIAGNOSIS — R062 Wheezing: Secondary | ICD-10-CM | POA: Diagnosis not present

## 2014-11-10 DIAGNOSIS — I6521 Occlusion and stenosis of right carotid artery: Secondary | ICD-10-CM | POA: Diagnosis not present

## 2014-11-10 DIAGNOSIS — R05 Cough: Secondary | ICD-10-CM | POA: Diagnosis not present

## 2014-11-10 DIAGNOSIS — I10 Essential (primary) hypertension: Secondary | ICD-10-CM | POA: Diagnosis not present

## 2014-11-10 DIAGNOSIS — Z6829 Body mass index (BMI) 29.0-29.9, adult: Secondary | ICD-10-CM | POA: Diagnosis not present

## 2014-11-10 DIAGNOSIS — K219 Gastro-esophageal reflux disease without esophagitis: Secondary | ICD-10-CM | POA: Diagnosis not present

## 2014-11-10 DIAGNOSIS — J209 Acute bronchitis, unspecified: Secondary | ICD-10-CM | POA: Diagnosis not present

## 2014-11-10 DIAGNOSIS — J0100 Acute maxillary sinusitis, unspecified: Secondary | ICD-10-CM | POA: Diagnosis not present

## 2014-11-10 DIAGNOSIS — E119 Type 2 diabetes mellitus without complications: Secondary | ICD-10-CM | POA: Diagnosis not present

## 2014-11-30 DIAGNOSIS — I2510 Atherosclerotic heart disease of native coronary artery without angina pectoris: Secondary | ICD-10-CM | POA: Diagnosis not present

## 2014-11-30 DIAGNOSIS — N528 Other male erectile dysfunction: Secondary | ICD-10-CM | POA: Diagnosis not present

## 2014-11-30 DIAGNOSIS — E780 Pure hypercholesterolemia: Secondary | ICD-10-CM | POA: Diagnosis not present

## 2014-11-30 DIAGNOSIS — I6521 Occlusion and stenosis of right carotid artery: Secondary | ICD-10-CM | POA: Diagnosis not present

## 2014-12-08 ENCOUNTER — Encounter (HOSPITAL_COMMUNITY): Payer: Self-pay | Admitting: Cardiology

## 2015-02-27 ENCOUNTER — Other Ambulatory Visit: Payer: Self-pay | Admitting: Internal Medicine

## 2015-02-27 DIAGNOSIS — R102 Pelvic and perineal pain unspecified side: Secondary | ICD-10-CM

## 2015-02-27 DIAGNOSIS — E119 Type 2 diabetes mellitus without complications: Secondary | ICD-10-CM | POA: Diagnosis not present

## 2015-02-27 DIAGNOSIS — I6529 Occlusion and stenosis of unspecified carotid artery: Secondary | ICD-10-CM | POA: Diagnosis not present

## 2015-02-27 DIAGNOSIS — I10 Essential (primary) hypertension: Secondary | ICD-10-CM | POA: Diagnosis not present

## 2015-02-27 DIAGNOSIS — Z8719 Personal history of other diseases of the digestive system: Secondary | ICD-10-CM

## 2015-02-27 DIAGNOSIS — Z6831 Body mass index (BMI) 31.0-31.9, adult: Secondary | ICD-10-CM | POA: Diagnosis not present

## 2015-02-27 DIAGNOSIS — I25810 Atherosclerosis of coronary artery bypass graft(s) without angina pectoris: Secondary | ICD-10-CM | POA: Diagnosis not present

## 2015-02-27 DIAGNOSIS — E785 Hyperlipidemia, unspecified: Secondary | ICD-10-CM | POA: Diagnosis not present

## 2015-02-27 DIAGNOSIS — K644 Residual hemorrhoidal skin tags: Secondary | ICD-10-CM | POA: Diagnosis not present

## 2015-03-01 ENCOUNTER — Ambulatory Visit
Admission: RE | Admit: 2015-03-01 | Discharge: 2015-03-01 | Disposition: A | Discharge status: Home / Self Care | Payer: Medicare Other | Source: Ambulatory Visit | Attending: Internal Medicine | Admitting: Internal Medicine

## 2015-03-01 DIAGNOSIS — N400 Enlarged prostate without lower urinary tract symptoms: Secondary | ICD-10-CM | POA: Diagnosis not present

## 2015-03-01 DIAGNOSIS — Z87891 Personal history of nicotine dependence: Secondary | ICD-10-CM | POA: Diagnosis not present

## 2015-03-01 DIAGNOSIS — K5730 Diverticulosis of large intestine without perforation or abscess without bleeding: Secondary | ICD-10-CM | POA: Diagnosis not present

## 2015-03-01 DIAGNOSIS — Z8719 Personal history of other diseases of the digestive system: Secondary | ICD-10-CM

## 2015-03-01 DIAGNOSIS — R102 Pelvic and perineal pain unspecified side: Secondary | ICD-10-CM

## 2015-03-13 DIAGNOSIS — M25551 Pain in right hip: Secondary | ICD-10-CM | POA: Diagnosis not present

## 2015-03-13 DIAGNOSIS — Z981 Arthrodesis status: Secondary | ICD-10-CM | POA: Diagnosis not present

## 2015-04-25 DIAGNOSIS — M1612 Unilateral primary osteoarthritis, left hip: Secondary | ICD-10-CM | POA: Diagnosis not present

## 2015-05-01 DIAGNOSIS — Z6831 Body mass index (BMI) 31.0-31.9, adult: Secondary | ICD-10-CM | POA: Diagnosis not present

## 2015-05-01 DIAGNOSIS — L0292 Furuncle, unspecified: Secondary | ICD-10-CM | POA: Diagnosis not present

## 2015-05-09 DIAGNOSIS — E119 Type 2 diabetes mellitus without complications: Secondary | ICD-10-CM | POA: Diagnosis not present

## 2015-05-09 DIAGNOSIS — I209 Angina pectoris, unspecified: Secondary | ICD-10-CM | POA: Diagnosis not present

## 2015-05-09 DIAGNOSIS — I6521 Occlusion and stenosis of right carotid artery: Secondary | ICD-10-CM | POA: Diagnosis not present

## 2015-05-09 DIAGNOSIS — I2510 Atherosclerotic heart disease of native coronary artery without angina pectoris: Secondary | ICD-10-CM | POA: Diagnosis not present

## 2015-05-15 DIAGNOSIS — J209 Acute bronchitis, unspecified: Secondary | ICD-10-CM | POA: Diagnosis not present

## 2015-05-15 DIAGNOSIS — R05 Cough: Secondary | ICD-10-CM | POA: Diagnosis not present

## 2015-05-15 DIAGNOSIS — Z6831 Body mass index (BMI) 31.0-31.9, adult: Secondary | ICD-10-CM | POA: Diagnosis not present

## 2015-05-16 DIAGNOSIS — I10 Essential (primary) hypertension: Secondary | ICD-10-CM | POA: Diagnosis not present

## 2015-05-16 DIAGNOSIS — I209 Angina pectoris, unspecified: Secondary | ICD-10-CM | POA: Diagnosis not present

## 2015-05-16 DIAGNOSIS — R0989 Other specified symptoms and signs involving the circulatory and respiratory systems: Secondary | ICD-10-CM | POA: Diagnosis not present

## 2015-05-16 DIAGNOSIS — I6529 Occlusion and stenosis of unspecified carotid artery: Secondary | ICD-10-CM | POA: Diagnosis not present

## 2015-05-16 DIAGNOSIS — E119 Type 2 diabetes mellitus without complications: Secondary | ICD-10-CM | POA: Diagnosis not present

## 2015-05-21 NOTE — H&P (Signed)
OFFICE VISIT NOTES COPIED TO EPIC FOR DOCUMENTATION   Miguel Brock 05/09/2015 11:27 AM Location: [facility] Patient #: [id number] DOB: 07/20/1946 Married / Language: Cleophus Molt / Race: White Male    History of Present Illness(Jagadeesh Carlynn Herald, MD; 05/09/2015 1:14 PM) The patient is a 69 year old male who presents for a follow-up for CAD. Miguel Brock is a 69 year old Caucasian male with history of known coronary artery disease presents here for followup of CAD. Patient has undergone staged intervention to mid LAD and circumflex coronary artery on 05/28/2011 and to mid RCA on 06/10/2011 with implantation of drug-eluting stents to the left system and nondrug eluding stent to the right coronary artery.  He was admitted to the hospital in March 2015 with GI bleed due to large internal and external hemorrhoids. He underwent banding. He has not had any further GI bleed. He was also recommended a to surgery but as he is remained stable, he does not need to see unless necessary from the surgical standpoint.  He otherwise is doing well and presents here for follow-up of coronary artery disease and carotid stenosis. He has noticed chest pain that is similar to his angina when he had stents placed that started 2-3 months ago and worse in the past 2 weeks and more frequent with radiation to the left arm. Occurs daily. He has occasional dizziness. Also headaches. No TIA, no visual disturbances. He has noticed mild left leg pain, otherwise no other specific complaints.  No dyspnea. No palpitations, has not used any sublingual nitroglycerin recently. No significant weight change recently. Diabetes has been well-controlled.  Additional reasons for visit:  Chest pain on exertionis described as the following: The onset of the chest pain on exertion has been gradual and has been occurring in an increasing pattern for 15 minutes. The chest pain on exertion is described as a mild to  moderate pressure sensation. The chest pain on exertion is described as being located in the substernal area. The chest pain on exertion radiates to the left arm. The pain is precipitated by exercise (physical activity). The symptoms are aggravated by exertion. The symptoms are relieved by rest. The symptoms have been associated with dizziness and headache, while there is no dyspnea.  Follow-up for Carotid artery stenosisis described as the following: Overall the patient has been doing well. Associated features include: lightheadedness. Patient has been compliant. ASVD risk factors include: diabetes, hyperlipidemia and hypertension.    Problem List/Past Medical(Charavina Reader; 05/09/2015 11:34 AM) Controlled diabetes mellitus type II without complication (E08.1). Office visit 02/27/2015: HbA1c 6.1%, blood pressure controlled, no changes in medications. Atherosclerosis of native coronary artery of native heart without angina pectoris (I25.10). Exercise Myoview stress test 01/07/2014: 1. Resting EKG showed normal sinus rhythm, poor R wave progression. Stress EKG was positive for ischemia. There was 2.5 mm ST depression noted in the inferior and lateral leads with exercise persisting for > 3 minutes into recovery. No chest pain reported. The test was terminated due to fatigue and achievement of the target heart rate. Patient exercised on BRUCE PROTOCOL for 7 minutes 0 seconds. The maximum work level achieved was 7.6 MET's. The test was terminated due to achievement of the target heart rate. Normal BP response. 2. Perfusion imaging study demonstrated a moderate-sized moderate ischemia in the inferior wall extending from the mid ventricle towards the apex including the inferoapical segment. Dynamic gated images reveal normal wall motion and endocardial thickening. Left ventricular ejection fraction was estimated to be 61%.  Compared to the study done on 07/24/2012, ischemia is new. This represents  intermediate risk study. Patient has history of non-drug-eluting stent to the right coronary artery and balloon angioplasty to the PDA branch of RCA, and the study may represent restenosis in the segments.  Exercise sestamibi 07/24/12: EKG positive for ischemia. 9 min, 12.5 METs. No chest pain. Diaphamatic attenuation without ischemia.   Coronary angiogram 01/11/2014: Mid RCA 4.0 x 24 mm Promus Premier drug-eluting stent implantation. Mid LAD overlapped with a 2.5 x 8 mm promos Premier drug-eluting stent. 05/27/13: Proximal and Mid LAD stent 3x22 and 3x38 Xience Prime DES. 3x22 Resolute stent and 06/10/11 Stenting of mid RCA with 4x18 Integrity stent (non-DES) and scoring balloon angioplasty of PDA with 2.0x10 Balloon angioplasty widely patent, secondary PD branch has ostial 90% stenosis but small. No change from prior angiogram. Postsurgical percutaneous transluminal coronary angioplasty status (Z98.61). Staged intervention: 05/28/11 Proximal and Mid LAD stent 3x22 and 3x38 Resolute and Xience Prime DES. Proximal Circumflex 3x22 Resolute stent.  06/10/11 Stenting of mid RCA with 4x18 Integrity stent (non-DES) and scoring balloon angioplasty of PDA with 2.0x10 Balloon. Shortness of breath (R06.02) Essential hypertension, benign (I10) Other male erectile dysfunction (N52.8) Asymptomatic stenosis of right carotid artery (I65.21). Carotid artery duplex 11/10/2014: Moderate stenosis of the right distal internal carotid artery, mid internal carotid artery and proximal internal carotid artery (>50% stenosis in the range of 50-69%). compared to the study done on 07/14/2014, the right ICA velocity has decreased and stenosis range decreased from greater than 70%. Stable right carotid duplex, follow up in six months is appropriate if clinically indicated.  Carotid artery duplex 07/14/2014: 1. Severe stenosis of the right proximal internal carotid artery (>50% stenosis in the range of >=70%). Moderate stenosis of  the right distal internal carotid artery and mid internal carotid artery (>50% stenosis in the range of 50-69%). 2. No evidence of hemodynamically significant stenosis in the left carotid bifurcation vessels. Compared to 12/31/2013, right ICA stenosis severity has progressed significantly from less than 50-69%. Stenosis appears to be in the range of closer to 80% or more. Clinical correlation is recommended. Follow up in four months is appropriate if clinically indicated. Pure hypercholesterolemia (E78.0). Labs 06/14/2014: Urinary microalbumin elevated at 166. Total cholesterol 122, triglycerides 115, HDL 38, LDL 61. Apo-lipoprotein be 67, normal. CMP, CBC normal. TSH minimally elevated at 4.43, T3-T4 normal. History of lower GI bleeding (Z87.19). March 2015: External and internal hemorrhoids S/P Banding    Allergies(Charavina Reader; 05/09/2015 11:34 AM) Lisinopril *ANTIHYPERTENSIVES*. Cough.    Family History(Charavina Reader; 05/09/2015 11:34 AM) Coronary Artery Disease. Father, Brother. Father died at age 27 from heart disease. Mother is 16 yrs. old-healthy. 1 brother (age 7) h/o stents. Father died at age 33 from heart disease. Mother is 45 yrs. old-healthy. 1 brother (age 45) h/o stents.    Social History(Charavina Reader; 05/09/2015 11:34 AM) Number of Children. 3. Current tobacco use. Former smoker. quit in 1988 Marital status. Married. Alcohol Use. Moderate alcohol use. 2-3 shots of liquor several times a week.    Past Surgical History(Charavina Reader; 05/09/2015 11:34 AM) Heart Cath: 05/21/11 Proximal RCA 90%, Large PDA with bifurcationg 80% stenosis, PL normal, LAD severe proximal and mid diffuse disease and distal tandem 90% stenosis and Proximal Circumflex 80% stenosis. Normal LVEF. 06/10/11 Stenting of mid RCA with 4x18 Integrity stent (non-DES) and scoring balloon angioplasty of PDA with 2.0x10 Balloon. Left rotator cuff surgery in 1991 Surgery on  right hand 5/11 Right elbow surgery in  1999 Eyelid surgery in 2010 05/28/11 Proximal and Mid LAD stent 3x22 and 3x38 Resolute and Xience Prime DES. Proximal Circumflex 3x22 Resolute stent. Hip Surgery (free text). 04/2014 Left. Screws placed and tendon repaired    Medication History(Charavina Reader; 05/09/2015 11:36 AM) Nitrostat (0.4MG  Tab Sublingual, under tongue Sublingual every 5 minutes as needed for chest pain., Taken starting 01/12/2014) Active. Aspirin (81MG  Tablet, 1 Oral daily, Taken starting 02/03/2013) Active. Metoprolol Succinate ER (100MG  Tablet ER 24HR, 1 Oral daily, Taken starting 07/04/2014) Active. Hydrochlorothiazide (25MG  Tablet, 1 Oral daily, Taken starting 08/29/2014) Active. Losartan Potassium (100MG  Tablet, 1 (one) Tablet Oral daily, Taken starting 08/29/2014) Active. Lipitor (80MG  Tablet, 1 Oral daily, after a meal, Taken starting 12/12/2014) Active. Plavix (75MG  Tablet, 1 (one) Tablet Oral 1 daily with breakfast, Taken starting 12/13/2014) Active. Zantac (1- 150 Mg as needed) Active. MetFORMIN HCl (500MG  Tablet, 1 Oral two times daily) Active. Medications Reconciled.    Diagnostic Studies History(Charavina Reader; 05/09/2015 11:34 AM) Exercise sestamibi 07/24/12: EKG positive for ischemia. 9 min, 12.5 METs. No chest pain. Diaph attenu ECG 06/17/12: S. Bradycardia @ 55/min. Inf and lat sagging ST depression. No T wave inversion. Compared to ECG 12/16/11, sagging non specific ST chages are new. Colonoscopy in 2006 Carotid Doppler. Carotid artery duplex 07/14/2014: 1. Severe stenosis of the right proximal internal carotid artery (>50% stenosis in the range of >=70%). Moderate stenosis of the right distal internal carotid artery and mid internal carotid artery (>50% stenosis in the range of 50-69%). 2. No evidence of hemodynamically significant stenosis in the left carotid bifurcation vessels. Compared to 12/31/2013, right ICA stenosis severity has progressed  significantly from less than 50-69%. Stenosis appears to be in the range of closer to 80% or more. Clinical correlation is recommended. Follow up in four months is appropriate if clinically indicated. Nuclear stress test. Exercise Myoview stress test 01/07/2014: 1. Resting EKG showed normal sinus rhythm, poor R wave progression. Stress EKG was positive for ischemia. There was 2.5 mm ST depression noted in the inferior and lateral leads with exercise persisting for > 3 minutes into recovery. No chest pain reported. The test was terminated due to fatigue and achievement of the target heart rate. Patient exercised on BRUCE PROTOCOL for 7 minutes 0 seconds. The maximum work level achieved was 7.6 MET's. The test was terminated due to achievement of the target heart rate. Normal BP response. 2. Perfusion imaging study demonstrated a moderate-sized moderate ischemia in the inferior wall extending from the mid ventricle towards the apex including the inferoapical segment. Dynamic gated images reveal normal wall motion and endocardial thickening. Left ventricular ejection fraction was estimated to be 61%. Compared to the study done on 07/24/2012, ischemia is new. This represents intermediate risk study. Patient has history of non-drug-eluting stent to the right coronary artery and balloon angioplasty to the PDA branch of RCA, and the study may represent restenosis in the segments. Clinical correlation recommended. Coronary Angiogram. 01/11/2014: Mid RCA 4.0 x 24 mm Promus Premier drug-eluting stent implantation. Mid LAD overlapped with a 2.5 x 8 mm promos Premier drug-eluting stent. 05/27/13: Proximal and Mid LAD stent 3x22 and 3x38 Resolute and Xience Prime DES. and 06/10/11 Stenting of mid RCA with 4x18 Integrity stent (non-DES) and scoring balloon angioplasty of PDA with 2.0x10 Balloon angioplasty widely patent, secondary PD branch has ostial 90% stenosis but small. No change from prior angiogram. Nuclear stress  5.11.12 Positive EKG changes of ischemia. No chest pain. Diaph. atten. no ischemia    Other Problems(Charavina  Reader; 05/09/2015 11:34 AM) Unspecified Diagnosis    Review of Systems(Jagadeesh Carlynn Herald, MD; 05/09/2015 12:02 PM) General:Not Present- Anorexia, Fatigue and Fever. Respiratory:Not Present- Cough, Decreased Exercise Tolerance and Dyspnea. Cardiovascular:Present- Chest Painand Leg Cramps (at night). Not Present- Claudications, Edema, Orthopnea, Paroxysmal Nocturnal Dyspnea and Shortness of Breath. Gastrointestinal:Not Present- Change in Bowel Habits, Constipation and Nausea. Neurological:Present- Headaches(more frequent headaches laterly over past 1 month. not severe. no other associated symptoms). Not Present- Focal Neurological Symptoms. Endocrine:Not Present- Appetite Changes, Cold Intolerance and Heat Intolerance. Hematology:Not Present- Anemia, Petechiae and Prolonged Bleeding.    Vitals(Charavina Reader; 05/09/2015 11:37 AM) 05/09/2015 11:32 AM Weight: 212 lb Height: 71 in Body Surface Area: 2.19 m Body Mass Index: 29.57 kg/m Pulse: 61 (Regular) P.OX: 98% (Room air) BP: 142/68 (Sitting, Left Arm, Standard)     Physical Exam(Jagadeesh Carlynn Herald, MD; 05/09/2015 12:05 PM) The physical exam findings are as follows:   General Mental Status- Alert. General Appearance- Cooperative and Appears stated age. Not in acute distress. Orientation- Oriented X3. Build & Nutrition- Well built.   Head and Neck Thyroid Gland Characteristics- no palpable nodules. no palpable enlargement.   Chest and Lung Exam Palpation:Tender- No chest wall tenderness. Auscultation: Breath sounds:- Clear.   Cardiovascular Inspection:Jugular vein- Right- No Distention. Auscultation:Heart Sounds- S1 WNL, S2 WNL and No gallop present. Murmurs & Other Heart Sounds: Murmur:- No murmur.   Abdomen Palpation/Percussion:Palpation and  Percussion of the abdomen reveal - Non Tender and No hepatosplenomegaly. Auscultation:Auscultation of the abdomen reveals - Bowel sounds normal.   Peripheral Vascular Lower Extremity:Inspection- Left- No Pigmentation or Varicose veins. Right- No Pigmentation or Varicose veins. Palpation:Edema- Left- No edema. Right- No edema. Femoral pulse- Left- Normal. Right- Normal. Popliteal pulse- Left- Normal. Right- Normal. Dorsalis pedis pulse- Left- Normal. Right- Normal. Posterior tibial pulse- Left- Normal. Right- Normal. Carotid arteries- Bilateral- Soft Bruit. Abdomen- No prominent abdominal aortic pulsation or epigastric bruit.   Neurologic Motor:- Grossly intact without any focal deficits.   Musculoskeletal Global Assessment Left Lower Extremity - normal range of motion without pain. Right Lower Extremity- normal range of motion without pain.    Assessment & Plan(Jagadeesh Carlynn Herald, MD; 05/09/2015 1:15 PM) Atherosclerosis of native coronary artery of native heart with angina pectoris (I25.119) Story: Coronary angiogram 01/11/2014: Mid RCA 4.0 x 24 mm Promus Premier drug-eluting stent implantation. Mid LAD overlapped with a 2.5 x 8 mm promos Premier drug-eluting stent. 05/27/13: Proximal and Mid LAD stent 3x22 and 3x38 Xience Prime DES. 3x22 Resolute stent and 06/10/11 Stenting of mid RCA with 4x18 Integrity stent (non-DES) and scoring balloon angioplasty of PDA with 2.0x10 Balloon angioplasty widely patent, secondary PD branch has ostial 90% stenosis but small. No change from prior angiogram.  Exercise Myoview stress test 01/07/2014: 1. Resting EKG showed normal sinus rhythm, poor R wave progression. Stress EKG was positive for ischemia. There was 2.5 mm ST depression noted in the inferior and lateral leads with exercise persisting for > 3 minutes into recovery. No chest pain reported. The test was terminated due to fatigue and achievement of the target heart rate.  Patient exercised on BRUCE PROTOCOL for 7 minutes 0 seconds. The maximum work level achieved was 7.6 MET's. The test was terminated due to achievement of the target heart rate. Normal BP response. 2. Perfusion imaging study demonstrated a moderate-sized moderate ischemia in the inferior wall extending from the mid ventricle towards the apex including the inferoapical segment. Dynamic gated images reveal normal wall motion and endocardial thickening. Left ventricular ejection fraction  was estimated to be 61%. Compared to the study done on 07/24/2012, ischemia is new. This represents intermediate risk study. Patient has history of non-drug-eluting stent to the right coronary artery and balloon angioplasty to the PDA branch of RCA, and the study may represent restenosis in the segments. Impression: EKG 05/09/2015: Normal sinus rhythm at the rate of 58 bpm, left atrial enlargement, IVCD, nonspecific ST segment sagging in the inferior leads and lateral leads. No evidence of ischemia, normal QT interval. No significant change from EKG 07/20/2014. Current Plans l Complete electrocardiogram (93000)  Angina pectoris (I20.9) Current Plans l Continued Nitrostat 0.4MG , under tongue Tab Sublingual every 5 minutes as needed for chest pain., #25, 05/09/2015, Ref. x4. l Started AmLODIPine Besylate 5MG , 1 (one) Tablet daily, #30, 05/09/2015, Ref. x2. Future Plans l 4/43/1540: METABOLIC PANEL, BASIC (08676) - one time l 05/17/2015: CBC & PLATELETS (AUTO) (19509) - one time l 05/17/2015: PT (PROTHROMBIN TIME) (32671) - one time  Asymptomatic stenosis of right carotid artery (I65.21) Story: Carotid artery duplex 11/10/2014: Moderate stenosis of the right distal internal carotid artery, mid internal carotid artery and proximal internal carotid artery (>50% stenosis in the range of 50-69%). compared to the study done on 07/14/2014, the right ICA velocity has decreased and stenosis range  decreased from greater than 70%. Stable right carotid duplex, follow up in six months is appropriate if clinically indicated. Future Plans l 05/16/2015: Complete duplex scan of carotid artery (24580) : Carotid stenosis bilateral asymptomatic - one time  Controlled diabetes mellitus type II without complication (D98.3) Story: Office visit 02/27/2015: HbA1c 6.1%, blood pressure controlled, no changes in medications. Future Plans l 05/17/2015: HEMOGLOBIN GLYCLATED (HGB A1C) (38250) - one time  Essential hypertension, benign (I10) Future Plans l 05/17/2015: LIPOPROTEIN, BLD, BY NMR (53976) - one time l 05/17/2015: TSH (THYROID STIMULATING HORMONE) (73419) - one time  Pure hypercholesterolemia (E78.0) Story: Labs 06/14/2014: Urinary microalbumin elevated at 166. Total cholesterol 122, triglycerides 115, HDL 38, LDL 61. Apo-lipoprotein be 67, normal. CMP, CBC normal. TSH minimally elevated at 4.43, T3-T4 normal. Current Plans l Mechanism of underlying disease process and action of medications discussed with the patient. I discussed primary/secondary prevention and also dietary counceling was done. S/L NTG was prescribed and explained how to and when to use it and to notify us if there is change in frequency of use. Interaction with cialis-like agents (if applicable was discussed). Patient's symptoms that started 2-3 months ago, clearly worsening over the past 2 weeks, are very suggestive of progressive angina pectoris.  Patient instructed not to do heavy lifting, heavy exertional activity, swimming until evaluation is complete. Patient instructed to call if symptoms worse or to go to the ED for further evaluation. Schedule for cardiac catheterization, and possible angioplasty. We discussed regarding risks, benefits, alternatives to this including stress testing, CTA and continued medical therapy. Patient wants to proceed. Understands <1-2% risk of death, stroke, MI, urgent CABG,  bleeding, infection, renal failure but not limited to these. Video recording of the procedure shown to the patient. Office visit after the tests.     Addendum Note(Jagadeesh Carlynn Herald, MD; 05/17/2015 6:03 PM) Labs 05/16/2015: Serum glucose 126 mg. BUN 19, serum creatinine 1.34, eGFR 54 mL. CBC normal. HbA1c 6.7%. TSH normal at 3.710. Total cholesterol 114, triglycerides 85, HDL 45, LDL 52. LDL particle #747.  Labs are within normal limits, mild renal insufficiency, stable. Patient has known diabetes which is well controlled. Continue present medical therapy. Labs are stable for proceeding with coronary  angiography.    Signed electronically by Laverda Page, MD (05/09/2015 1:15 PM)

## 2015-05-23 ENCOUNTER — Ambulatory Visit (HOSPITAL_COMMUNITY)
Admission: RE | Admit: 2015-05-23 | Discharge: 2015-05-24 | Disposition: A | Discharge status: Home / Self Care | Payer: Medicare Other | Source: Ambulatory Visit | Attending: Cardiology | Admitting: Cardiology

## 2015-05-23 ENCOUNTER — Encounter (HOSPITAL_COMMUNITY)
Admission: RE | Disposition: A | Discharge status: Home / Self Care | Payer: Medicare Other | Source: Ambulatory Visit | Attending: Cardiology

## 2015-05-23 ENCOUNTER — Encounter (HOSPITAL_COMMUNITY): Payer: Self-pay | Admitting: *Deleted

## 2015-05-23 DIAGNOSIS — Z87891 Personal history of nicotine dependence: Secondary | ICD-10-CM | POA: Insufficient documentation

## 2015-05-23 DIAGNOSIS — I10 Essential (primary) hypertension: Secondary | ICD-10-CM | POA: Insufficient documentation

## 2015-05-23 DIAGNOSIS — I2582 Chronic total occlusion of coronary artery: Secondary | ICD-10-CM | POA: Diagnosis not present

## 2015-05-23 DIAGNOSIS — E780 Pure hypercholesterolemia: Secondary | ICD-10-CM | POA: Insufficient documentation

## 2015-05-23 DIAGNOSIS — Z9861 Coronary angioplasty status: Secondary | ICD-10-CM

## 2015-05-23 DIAGNOSIS — E119 Type 2 diabetes mellitus without complications: Secondary | ICD-10-CM | POA: Diagnosis not present

## 2015-05-23 DIAGNOSIS — I2510 Atherosclerotic heart disease of native coronary artery without angina pectoris: Secondary | ICD-10-CM | POA: Diagnosis not present

## 2015-05-23 DIAGNOSIS — I209 Angina pectoris, unspecified: Secondary | ICD-10-CM | POA: Diagnosis not present

## 2015-05-23 DIAGNOSIS — Z7902 Long term (current) use of antithrombotics/antiplatelets: Secondary | ICD-10-CM | POA: Insufficient documentation

## 2015-05-23 DIAGNOSIS — Z7982 Long term (current) use of aspirin: Secondary | ICD-10-CM | POA: Diagnosis not present

## 2015-05-23 DIAGNOSIS — R079 Chest pain, unspecified: Secondary | ICD-10-CM | POA: Diagnosis present

## 2015-05-23 DIAGNOSIS — E785 Hyperlipidemia, unspecified: Secondary | ICD-10-CM | POA: Insufficient documentation

## 2015-05-23 DIAGNOSIS — I25119 Atherosclerotic heart disease of native coronary artery with unspecified angina pectoris: Secondary | ICD-10-CM

## 2015-05-23 HISTORY — PX: CARDIAC CATHETERIZATION: SHX172

## 2015-05-23 LAB — MRSA PCR SCREENING: MRSA by PCR: NEGATIVE

## 2015-05-23 LAB — GLUCOSE, CAPILLARY
Glucose-Capillary: 105 mg/dL — ABNORMAL HIGH (ref 65–99)
Glucose-Capillary: 202 mg/dL — ABNORMAL HIGH (ref 65–99)
Glucose-Capillary: 126 mg/dL — ABNORMAL HIGH (ref 65–99)

## 2015-05-23 SURGERY — LEFT HEART CATH AND CORONARY ANGIOGRAPHY
Anesthesia: LOCAL

## 2015-05-23 MED ORDER — ACETAMINOPHEN 325 MG PO TABS: 650.0000 mg | ORAL_TABLET | ORAL | Status: DC | PRN

## 2015-05-23 MED ORDER — SODIUM CHLORIDE 0.9 % IJ SOLN: 3.0000 mL | Freq: Two times a day (BID) | INTRAMUSCULAR | Status: DC

## 2015-05-23 MED ORDER — HYDROMORPHONE HCL 1 MG/ML IJ SOLN
INTRAMUSCULAR | Status: AC
  Filled 2015-05-23: qty 1

## 2015-05-23 MED ORDER — CLOPIDOGREL BISULFATE 75 MG PO TABS
75.0000 mg | ORAL_TABLET | Freq: Every day | ORAL | Status: DC
  Administered 2015-05-24: 75 mg via ORAL
  Filled 2015-05-23: qty 1

## 2015-05-23 MED ORDER — METOPROLOL SUCCINATE ER 100 MG PO TB24
100.0000 mg | ORAL_TABLET | Freq: Every morning | ORAL | Status: DC
  Administered 2015-05-23 – 2015-05-24 (×2): 100 mg via ORAL
  Filled 2015-05-23 (×3): qty 1

## 2015-05-23 MED ORDER — ATORVASTATIN CALCIUM 80 MG PO TABS
80.0000 mg | ORAL_TABLET | Freq: Every day | ORAL | Status: DC
  Administered 2015-05-23: 18:00:00 80 mg via ORAL
  Filled 2015-05-23 (×2): qty 1

## 2015-05-23 MED ORDER — HEPARIN SODIUM (PORCINE) 1000 UNIT/ML IJ SOLN
INTRAMUSCULAR | Status: AC
  Filled 2015-05-23: qty 1

## 2015-05-23 MED ORDER — LIDOCAINE HCL (PF) 1 % IJ SOLN
INTRAMUSCULAR | Status: AC
  Filled 2015-05-23: qty 30

## 2015-05-23 MED ORDER — DIPHENHYDRAMINE HCL 25 MG PO TABS
25.0000 mg | ORAL_TABLET | Freq: Four times a day (QID) | ORAL | Status: DC | PRN
  Filled 2015-05-23: qty 1

## 2015-05-23 MED ORDER — MIDAZOLAM HCL 2 MG/2ML IJ SOLN
INTRAMUSCULAR | Status: AC
  Filled 2015-05-23: qty 2

## 2015-05-23 MED ORDER — AMLODIPINE BESYLATE 5 MG PO TABS
5.0000 mg | ORAL_TABLET | Freq: Every day | ORAL | Status: DC
  Administered 2015-05-23 – 2015-05-24 (×2): 5 mg via ORAL
  Filled 2015-05-23 (×2): qty 1

## 2015-05-23 MED ORDER — INSULIN ASPART 100 UNIT/ML ~~LOC~~ SOLN
0.0000 [IU] | Freq: Three times a day (TID) | SUBCUTANEOUS | Status: DC
  Administered 2015-05-23: 19:00:00 5 [IU] via SUBCUTANEOUS
  Administered 2015-05-24: 2 [IU] via SUBCUTANEOUS

## 2015-05-23 MED ORDER — ZOLPIDEM TARTRATE 5 MG PO TABS: 5.0000 mg | ORAL_TABLET | Freq: Every evening | ORAL | Status: DC | PRN

## 2015-05-23 MED ORDER — ONDANSETRON HCL 4 MG/2ML IJ SOLN: 4.0000 mg | Freq: Four times a day (QID) | INTRAMUSCULAR | Status: DC | PRN

## 2015-05-23 MED ORDER — SODIUM CHLORIDE 0.9 % IJ SOLN: 3.0000 mL | INTRAMUSCULAR | Status: DC | PRN

## 2015-05-23 MED ORDER — HEPARIN (PORCINE) IN NACL 2-0.9 UNIT/ML-% IJ SOLN
INTRAMUSCULAR | Status: AC
  Filled 2015-05-23: qty 1000

## 2015-05-23 MED ORDER — SODIUM CHLORIDE 0.9 % WEIGHT BASED INFUSION: 1.0000 mL/kg/h | INTRAVENOUS | Status: DC

## 2015-05-23 MED ORDER — SODIUM CHLORIDE 0.9 % WEIGHT BASED INFUSION
3.0000 mL/kg/h | INTRAVENOUS | Status: AC
  Administered 2015-05-23: 3 mL/kg/h via INTRAVENOUS

## 2015-05-23 MED ORDER — EPINEPHRINE 0.3 MG/0.3ML IJ SOAJ: 0.3000 mg | Freq: Once | INTRAMUSCULAR | Status: DC

## 2015-05-23 MED ORDER — HEPARIN SODIUM (PORCINE) 1000 UNIT/ML IJ SOLN
INTRAMUSCULAR | Status: DC | PRN
  Administered 2015-05-23: 3000 [IU] via INTRAVENOUS
  Administered 2015-05-23: 5000 [IU] via INTRAVENOUS

## 2015-05-23 MED ORDER — HYDROMORPHONE HCL 1 MG/ML IJ SOLN
INTRAMUSCULAR | Status: DC | PRN
  Administered 2015-05-23 (×3): 0.5 mg via INTRAVENOUS

## 2015-05-23 MED ORDER — VERAPAMIL HCL 2.5 MG/ML IV SOLN
INTRAVENOUS | Status: AC
  Filled 2015-05-23: qty 2

## 2015-05-23 MED ORDER — SODIUM CHLORIDE 0.9 % WEIGHT BASED INFUSION
3.0000 mL/kg/h | INTRAVENOUS | Status: DC
  Administered 2015-05-23: 3 mL/kg/h via INTRAVENOUS

## 2015-05-23 MED ORDER — ASPIRIN EC 81 MG PO TBEC
81.0000 mg | DELAYED_RELEASE_TABLET | Freq: Every day | ORAL | Status: DC
  Administered 2015-05-23 – 2015-05-24 (×2): 81 mg via ORAL
  Filled 2015-05-23 (×2): qty 1

## 2015-05-23 MED ORDER — NITROGLYCERIN 0.4 MG SL SUBL: 0.4000 mg | SUBLINGUAL_TABLET | SUBLINGUAL | Status: DC | PRN

## 2015-05-23 MED ORDER — MIDAZOLAM HCL 2 MG/2ML IJ SOLN
INTRAMUSCULAR | Status: DC | PRN
  Administered 2015-05-23: 1 mg via INTRAVENOUS
  Administered 2015-05-23: 2 mg via INTRAVENOUS

## 2015-05-23 MED ORDER — NITROGLYCERIN 1 MG/10 ML FOR IR/CATH LAB
INTRA_ARTERIAL | Status: AC
  Filled 2015-05-23: qty 10

## 2015-05-23 MED ORDER — SODIUM CHLORIDE 0.9 % IV SOLN: 250.0000 mL | INTRAVENOUS | Status: DC | PRN

## 2015-05-23 MED ORDER — PANTOPRAZOLE SODIUM 40 MG PO TBEC: 40.0000 mg | DELAYED_RELEASE_TABLET | Freq: Every day | ORAL | Status: DC | PRN

## 2015-05-23 MED ORDER — HYDROMORPHONE HCL 1 MG/ML IJ SOLN: 1.0000 mg | INTRAMUSCULAR | Status: DC | PRN

## 2015-05-23 MED ORDER — NITROGLYCERIN 0.2 MG/ML ON CALL CATH LAB
INTRAVENOUS | Status: DC | PRN
  Administered 2015-05-23 (×3): 200 ug via INTRA_ARTERIAL
  Administered 2015-05-23: 150 ug via INTRA_ARTERIAL

## 2015-05-23 MED ORDER — SODIUM CHLORIDE 0.9 % WEIGHT BASED INFUSION: 3.0000 mL/kg/h | INTRAVENOUS | Status: DC

## 2015-05-23 MED ORDER — ASPIRIN 81 MG PO CHEW: 81.0000 mg | CHEWABLE_TABLET | ORAL | Status: DC

## 2015-05-23 MED ORDER — VERAPAMIL HCL 2.5 MG/ML IV SOLN
INTRAVENOUS | Status: DC | PRN
  Administered 2015-05-23: 14:00:00 via INTRA_ARTERIAL

## 2015-05-23 MED ORDER — DIPHENHYDRAMINE HCL 25 MG PO CAPS: 25.0000 mg | ORAL_CAPSULE | Freq: Four times a day (QID) | ORAL | Status: DC | PRN

## 2015-05-23 MED ORDER — HYDROCHLOROTHIAZIDE 25 MG PO TABS
25.0000 mg | ORAL_TABLET | Freq: Every morning | ORAL | Status: DC
  Administered 2015-05-23 – 2015-05-24 (×2): 25 mg via ORAL
  Filled 2015-05-23 (×2): qty 1

## 2015-05-23 MED ORDER — IOHEXOL 350 MG/ML SOLN
INTRAVENOUS | Status: DC | PRN
  Administered 2015-05-23: 185 mL via INTRA_ARTERIAL

## 2015-05-23 MED ORDER — METHOCARBAMOL 500 MG PO TABS: 500.0000 mg | ORAL_TABLET | Freq: Four times a day (QID) | ORAL | Status: DC | PRN

## 2015-05-23 SURGICAL SUPPLY — 13 items
BALLN ANGIOSCULPT RX 2.0X6 (BALLOONS) ×2
BALLOON ANGIOSCULPT RX 2.0X6 (BALLOONS) ×1 IMPLANT
CATH OPTITORQUE TIG 4.0 5F (CATHETERS) ×2 IMPLANT
DEVICE RAD COMP TR BAND LRG (VASCULAR PRODUCTS) ×2 IMPLANT
GLIDESHEATH SLEND A-KIT 6F 20G (SHEATH) ×2 IMPLANT
GUIDE CATH RUNWAY 6FR AL 1 (CATHETERS) ×2 IMPLANT
KIT ENCORE 26 ADVANTAGE (KITS) ×2 IMPLANT
KIT HEART LEFT (KITS) ×2 IMPLANT
PACK CARDIAC CATHETERIZATION (CUSTOM PROCEDURE TRAY) ×2 IMPLANT
TRANSDUCER W/STOPCOCK (MISCELLANEOUS) ×2 IMPLANT
TUBING CIL FLEX 10 FLL-RA (TUBING) ×2 IMPLANT
WIRE COUGAR XT STRL 190CM (WIRE) ×2 IMPLANT
WIRE SAFE-T 1.5MM-J .035X260CM (WIRE) ×2 IMPLANT

## 2015-05-23 NOTE — Interval H&P Note (Signed)
History and Physical Interval Note:  05/23/2015 1:31 PM  Miguel Brock  has presented today for surgery, with the diagnosis of cad  The various methods of treatment have been discussed with the patient and family. After consideration of risks, benefits and other options for treatment, the patient has consented to  Procedure(s): Left Heart Cath and Coronary Angiography (N/A) as a surgical intervention .  The patient's history has been reviewed, patient examined, no change in status, stable for surgery.  I have reviewed the patient's chart and labs.  Questions were answered to the patient's satisfaction.    Ischemic Symptoms? CCS III (Marked limitation of ordinary activity) Anti-ischemic Medical Therapy? Maximal Medical Therapy (2 or more classes of medications) Non-invasive Test Results? No non-invasive testing performed Prior CABG? No Previous CABG   Patient Information:   1-2V CAD, no prox LAD  A (7)  Indication: 20; Score: 7   Patient Information:   1-2V-CAD with DS 50-60% With No FFR, No IVUS  I (3)  Indication: 21; Score: 3   Patient Information:   1-2V-CAD with DS 50-60% With FFR  A (7)  Indication: 22; Score: 7   Patient Information:   1-2V-CAD with DS 50-60% With FFR>0.8, IVUS not significant  I (2)  Indication: 23; Score: 2   Patient Information:   3V-CAD without LMCA With Abnormal LV systolic function  A (9)  Indication: 48; Score: 9   Patient Information:   LMCA-CAD  A (9)  Indication: 49; Score: 9   Patient Information:   2V-CAD with prox LAD PCI  A (7)  Indication: 62; Score: 7   Patient Information:   2V-CAD with prox LAD CABG  A (8)  Indication: 62; Score: 8   Patient Information:   3V-CAD without LMCA With Low CAD burden(i.e., 3 focal stenoses, low SYNTAX score) PCI  A (7)  Indication: 63; Score: 7   Patient Information:   3V-CAD without LMCA With Low CAD burden(i.e., 3 focal stenoses, low SYNTAX score) CABG  A  (9)  Indication: 63; Score: 9   Patient Information:   3V-CAD without LMCA E06c - Intermediate-high CAD burden (i.e., multiple diffuse lesions, presence of CTO, or high SYNTAX score) PCI  U (4)  Indication: 64; Score: 4   Patient Information:   3V-CAD without LMCA E06c - Intermediate-high CAD burden (i.e., multiple diffuse lesions, presence of CTO, or high SYNTAX score) CABG  A (9)  Indication: 64; Score: 9   Patient Information:   LMCA-CAD With Isolated LMCA stenosis  PCI  U (6)  Indication: 65; Score: 6   Patient Information:   LMCA-CAD With Isolated LMCA stenosis  CABG  A (9)  Indication: 65; Score: 9   Patient Information:   LMCA-CAD Additional CAD, low CAD burden (i.e., 1- to 2-vessel additional involvement, low SYNTAX score) PCI  U (5)  Indication: 66; Score: 5   Patient Information:   LMCA-CAD Additional CAD, low CAD burden (i.e., 1- to 2-vessel additional involvement, low SYNTAX score) CABG  A (9)  Indication: 66; Score: 9   Patient Information:   LMCA-CAD Additional CAD, intermediate-high CAD burden (i.e., 3-vessel involvement, presence of CTO, or high SYNTAX score) PCI  I (3)  Indication: 67; Score: 3   Patient Information:   LMCA-CAD Additional CAD, intermediate-high CAD burden (i.e., 3-vessel involvement, presence of CTO, or high SYNTAX score) CABG  A (9)  Indication: 67; Score: 9  Miguel Brock

## 2015-05-23 NOTE — Progress Notes (Signed)
TR BAND REMOVAL  LOCATION:  right radial  DEFLATED PER PROTOCOL:  Yes.    TIME BAND OFF / DRESSING APPLIED:   1845   SITE UPON ARRIVAL:   Level 0  SITE AFTER BAND REMOVAL:  Level 0  REVERSE ALLEN'S TEST:    positive  CIRCULATION SENSATION AND MOVEMENT:  Within Normal Limits  Yes.    COMMENTS:

## 2015-05-24 DIAGNOSIS — E119 Type 2 diabetes mellitus without complications: Secondary | ICD-10-CM | POA: Diagnosis not present

## 2015-05-24 DIAGNOSIS — E780 Pure hypercholesterolemia: Secondary | ICD-10-CM | POA: Diagnosis not present

## 2015-05-24 DIAGNOSIS — E785 Hyperlipidemia, unspecified: Secondary | ICD-10-CM | POA: Diagnosis not present

## 2015-05-24 DIAGNOSIS — I2582 Chronic total occlusion of coronary artery: Secondary | ICD-10-CM | POA: Diagnosis not present

## 2015-05-24 DIAGNOSIS — I10 Essential (primary) hypertension: Secondary | ICD-10-CM | POA: Diagnosis not present

## 2015-05-24 DIAGNOSIS — I209 Angina pectoris, unspecified: Secondary | ICD-10-CM | POA: Diagnosis not present

## 2015-05-24 DIAGNOSIS — I2510 Atherosclerotic heart disease of native coronary artery without angina pectoris: Secondary | ICD-10-CM | POA: Diagnosis not present

## 2015-05-24 LAB — BASIC METABOLIC PANEL
ANION GAP: 7 (ref 5–15)
BUN: 17 mg/dL (ref 6–20)
CALCIUM: 8.8 mg/dL — AB (ref 8.9–10.3)
CHLORIDE: 108 mmol/L (ref 101–111)
CO2: 26 mmol/L (ref 22–32)
CREATININE: 1.25 mg/dL — AB (ref 0.61–1.24)
GFR, EST NON AFRICAN AMERICAN: 57 mL/min — AB (ref >60)
Glucose, Bld: 129 mg/dL — ABNORMAL HIGH (ref 65–99)
POTASSIUM: 3.9 mmol/L (ref 3.5–5.1)
Sodium: 141 mmol/L (ref 135–145)

## 2015-05-24 LAB — GLUCOSE, CAPILLARY: GLUCOSE-CAPILLARY: 123 mg/dL — AB (ref 65–99)

## 2015-05-24 LAB — CBC
HEMATOCRIT: 36.1 % — AB (ref 39.0–52.0)
HEMOGLOBIN: 12.2 g/dL — AB (ref 13.0–17.0)
MCH: 31.8 pg (ref 26.0–34.0)
MCHC: 33.8 g/dL (ref 30.0–36.0)
MCV: 94 fL (ref 78.0–100.0)
Platelets: 211 10*3/uL (ref 150–400)
RBC: 3.84 MIL/uL — ABNORMAL LOW (ref 4.22–5.81)
RDW: 13.1 % (ref 11.5–15.5)
WBC: 8.8 10*3/uL (ref 4.0–10.5)

## 2015-05-24 LAB — POCT ACTIVATED CLOTTING TIME: ACTIVATED CLOTTING TIME: 196 s

## 2015-05-24 MED ORDER — METFORMIN HCL 500 MG PO TABS: 500.0000 mg | ORAL_TABLET | Freq: Two times a day (BID) | ORAL | Status: DC

## 2015-05-24 MED ORDER — ANGIOPLASTY BOOK
Freq: Once | Status: AC
  Administered 2015-05-24: 05:00:00
  Filled 2015-05-24: qty 1

## 2015-05-24 MED FILL — Lidocaine HCl Local Preservative Free (PF) Inj 1%: INTRAMUSCULAR | Qty: 30 | Status: AC

## 2015-05-24 MED FILL — Heparin Sodium (Porcine) 2 Unit/ML in Sodium Chloride 0.9%: INTRAMUSCULAR | Qty: 1000 | Status: AC

## 2015-05-24 NOTE — Discharge Instructions (Signed)
Coronary Angioplasty Coronary angioplasty is a procedure to widen a narrowed or blocked blood vessel of the heart (coronary arteries). The artery is usually blocked by cholesterol buildup (plaque) in the lining or walls. When a vessel in the heart becomes partially blocked, there is decreased blood flow to that area. This may lead to chest pain or a heart attack (myocardial infarction).  LET [facility] CARE PROVIDER KNOW ABOUT:  Any allergies you have, including allergies to shellfish or contrast dye.  All medicines you are taking, including vitamins, herbs, eye drops, creams, and over-the-counter medicines.  Previous problems you or members of your family have had with the use of anesthetics.  Any blood disorders you have.  Previous surgeries you have had.  Any previous kidney problems or failure you have had.  Medical conditions you have.  Possibility of pregnancy, if this applies. RISKS AND COMPLICATIONS Generally, this is a safe procedure. However, problems can occur and include:   Injury to the blood vessels, including rupture or bleeding.   Infection, bleeding, or bruising at the catheter site.   Allergic reaction to the dye or contrast used.   Kidney damage from the dye or contrast used.   Blood clots that can lead to a stroke or heart attack.   Bleeding into the abdomen (retroperitoneal bleeding). BEFORE THE PROCEDURE  Do not eat or drink anything after midnight on the night before the procedure or as directed by your health care provider.  Ask your health care provider if you can take a sip of water with any approved medicines the morning of the procedure.   Ask your health care provider about changing or stopping your regular medicines. This is especially important if you are taking diabetes medicines or blood thinners. PROCEDURE  You may be given a medicine through an IV tube to help you relax (sedative) before and during the procedure.   The area where  the catheter will be inserted will be washed and shaved. This is usually done in the groin but may be done in the fold of your arm (near your elbow) or in the wrist.   A medicine will be given to numb the area where the catheter will be inserted (local anesthetic).   The catheter will be inserted into an artery using a guide wire. The catheter will be guided to the location of the narrowed or blocked artery with a type of X-ray called fluoroscopy.   Dye will then be injected and X-rays taken. The dye will help to show where any narrowing or blockages are located.   Once positioned at the narrowed or blocked portion of the blood vessel, a balloon will be inflated to make the artery wider. Expanding the balloon will crush the plaque into the wall of the vessel and improve the blood flow.   Sometimes the artery may be made wider by removing plaque using a laser or other tools.   When the blood flow is better, the balloon will be deflated and the catheter will be removed.   After the catheter is removed, a special dressing will be placed over the insertion site. AFTER THE PROCEDURE  If the procedure is done through the leg, you will be kept in bed lying flat for 6 hours. You will be told to not bend or cross your legs.   The insertion site will be checked often.   The pulse in your feet or wrist will be checked often.   Additional blood tests, X-rays, and an  electrocardiogram (or electrocardiography) may be done.  Document Released: 12/13/2000 Document Revised: 05/02/2014 Document Reviewed: 08/23/2013 [facility] Patient Information 2015 [place], [place]. This information is not intended to replace advice given to you by your health care provider. Make sure you discuss any questions you have with your health care provider.

## 2015-05-24 NOTE — Discharge Summary (Signed)
Physician Discharge Summary  Patient ID: Miguel Brock MRN: [id number] DOB/AGE: 70-Feb-1947 70 y.o.  Admit date: 05/23/2015 Discharge date: 05/24/2015  Primary Discharge Diagnosis: CAD s/p balloon angioplasty of RPDA DM HTN HLD  Significant Diagnostic Studies: Coronary angiogram 05/23/2015: Ost RPDA to RPDA lesion, 95% stenosed. There is a 20% residual stenosis post intervention with a 2.0 x 6 mm angiosculpt balloon. Previously placed Prox RCA to Mid RCA stent (unknown type) is patent. Dist LAD lesion, 100% stenosed. Patent proximal and mid LAD stent, occluded distal LAD, free by both bridging collaterals and contralateral collaterals. Patent circumflex coronary artery stent. LVEF 65%.  Hospital Course:  Miguel Brock is a 69 year old Caucasian male with history of known coronary artery disease presented to the office for followup of CAD. Patient has undergone staged intervention to mid LAD and circumflex coronary artery on 05/28/2011 and to mid RCA on 06/10/2011 with implantation of drug-eluting stents to the left system and nondrug eluding stent to the right coronary artery.  He was admitted to the hospital in March 2015 with GI bleed due to large internal and external hemorrhoids.  He underwent banding.  He has not had any further GI bleed. He was also recommended a to surgery but as he is remained stable, he does not need to see unless necessary from the surgical standpoint.  He had noticed chest pain that was similar to his angina when he had stents placed that started 2-3 months ago and worse in the past 2 weeks and more frequent with radiation to the left arm, very suggestive of progressive angina pectoris. He was scheduled for elective coronary angiogram.  He underwent successful balloon angioplasty to the RPDA. He is presently feeling well and has ambulated with cardiac rehab with no chest pain.  Recommendations on discharge: Follow up outpatient as scheduled next week.  Due to significant  CAD, will continue DAPT indefinitely as he has not had any recent bleeding complications.  Discharge Exam: Blood pressure 123/65, pulse 63, temperature 97.3 F (36.3 C), temperature source Oral, resp. rate 18, height 5\' 11"  (1.803 m), weight 94 kg (207 lb 3.7 oz), SpO2 96 %.    General appearance: alert, cooperative, appears stated age and no distress Neck: no adenopathy, no JVD, supple, symmetrical, trachea midline, thyroid not enlarged, symmetric, no tenderness/mass/nodules and bilateral soft carotid bruit Resp: clear to auscultation bilaterally Chest wall: no tenderness Cardio: regular rate and rhythm, S1, S2 normal, no murmur, click, rub or gallop Extremities: extremities normal, atraumatic, no cyanosis or edema Pulses: 2+ and symmetric right radial access site asymptomatic Skin: Skin color, texture, turgor normal. No rashes or lesions  Labs:   Lab Results  Component Value Date   WBC 8.8 05/24/2015   HGB 12.2* 05/24/2015   HCT 36.1* 05/24/2015   MCV 94.0 05/24/2015   PLT 211 05/24/2015    Recent Labs Lab 05/24/15 0350  NA 141  K 3.9  CL 108  CO2 26  BUN 17  CREATININE 1.25*  CALCIUM 8.8*  GLUCOSE 129*    Lipid Panel  No results found for: CHOL, TRIG, HDL, CHOLHDL, VLDL, LDLCALC  BNP (last 3 results) No results for input(s): BNP in the last 8760 hours.  ProBNP (last 3 results) No results for input(s): PROBNP in the last 8760 hours.  HEMOGLOBIN A1C Lab Results  Component Value Date   HGBA1C 6.3* 07/07/2013   MPG 134* 07/07/2013    Cardiac Panel (last 3 results) No results for input(s): CKTOTAL, CKMB, TROPONINI, RELINDX in the  last 8760 hours.  Lab Results  Component Value Date   TROPONINI <0.30 03/10/2014     TSH No results for input(s): TSH in the last 8760 hours.  EKG 05/24/2015: Sinus bradycardia at a rate of 52 bpm, normal axis, left atrial abnormality, IVCD, nonspecific ST and T-wave abnormality in inferior leads.  No significant change from EKG  on 05/09/2015.   Radiology: No results found.    FOLLOW UP PLANS AND APPOINTMENTS Discharge Instructions    Discharge patient    Complete by:  As directed             Medication List    TAKE these medications        amLODipine 5 MG tablet  Commonly known as:  NORVASC  Take 5 mg by mouth daily.     aspirin EC 81 MG tablet  Take 81 mg by mouth daily.     atorvastatin 80 MG tablet  Commonly known as:  LIPITOR  Take 80 mg by mouth daily with supper.     clopidogrel 75 MG tablet  Commonly known as:  PLAVIX  Take 1 tablet (75 mg total) by mouth daily with breakfast.     diphenhydrAMINE 25 MG tablet  Commonly known as:  BENADRYL  Take 25 mg by mouth every 6 (six) hours as needed for allergies.     EPINEPHrine 0.3 mg/0.3 mL Soaj injection  Commonly known as:  EPIPEN  Inject 0.3 mLs (0.3 mg total) into the muscle once.     hydrochlorothiazide 25 MG tablet  Commonly known as:  HYDRODIURIL  Take 1 tablet (25 mg total) by mouth every morning.     losartan 100 MG tablet  Commonly known as:  COZAAR  Take 100 mg by mouth every morning.     metFORMIN 500 MG tablet  Commonly known as:  GLUCOPHAGE  Take 500 mg by mouth 2 (two) times daily with a meal.     methocarbamol 500 MG tablet  Commonly known as:  ROBAXIN  Take 1 tablet (500 mg total) by mouth every 6 (six) hours as needed for muscle spasms.     metoprolol succinate 100 MG 24 hr tablet  Commonly known as:  TOPROL-XL  Take 100 mg by mouth every morning. Take with or immediately following a meal.     nitroGLYCERIN 0.4 MG SL tablet  Commonly known as:  NITROSTAT  Place 0.4 mg under the tongue every 5 (five) minutes as needed for chest pain.     oxyCODONE 5 MG immediate release tablet  Commonly known as:  Oxy IR/ROXICODONE  Take 1-2 tablets (5-10 mg total) by mouth every 3 (three) hours as needed for moderate pain, severe pain or breakthrough pain.     pantoprazole 40 MG tablet  Commonly known as:  PROTONIX   Take 1 tablet (40 mg total) by mouth daily.           Follow-up Information    Follow up with Adrian Prows, MD On 05/30/2015.   Specialty:  Cardiology   Why:  at 2:15   Contact information:   [address] [place] [address] [phone]        Rachel Bo, NP-C 05/24/2015, 8:16 AM  Pager: [phone] Office: [phone]

## 2015-05-24 NOTE — Progress Notes (Signed)
CARDIAC REHAB PHASE I   PRE:  Rate/Rhythm: 56 SB    BP: sitting 123/65    SaO2:   MODE:  Ambulation: 1000 ft   POST:  Rate/Rhythm: 73 SR    BP: sitting 122/63     SaO2:   Tolerated well. Ed completed however pt not interested in much change. Declined ex gl but pt walks around his property most days. Not interested in CRPII. [place], [place], ACSM 05/24/2015 8:51 AM

## 2015-05-30 ENCOUNTER — Encounter (HOSPITAL_COMMUNITY): Payer: Self-pay | Admitting: Cardiology

## 2015-05-30 DIAGNOSIS — I10 Essential (primary) hypertension: Secondary | ICD-10-CM | POA: Diagnosis not present

## 2015-05-30 DIAGNOSIS — I25119 Atherosclerotic heart disease of native coronary artery with unspecified angina pectoris: Secondary | ICD-10-CM | POA: Diagnosis not present

## 2015-05-31 DIAGNOSIS — D1801 Hemangioma of skin and subcutaneous tissue: Secondary | ICD-10-CM | POA: Diagnosis not present

## 2015-05-31 DIAGNOSIS — L570 Actinic keratosis: Secondary | ICD-10-CM | POA: Diagnosis not present

## 2015-05-31 DIAGNOSIS — D225 Melanocytic nevi of trunk: Secondary | ICD-10-CM | POA: Diagnosis not present

## 2015-05-31 DIAGNOSIS — L814 Other melanin hyperpigmentation: Secondary | ICD-10-CM | POA: Diagnosis not present

## 2015-05-31 DIAGNOSIS — L821 Other seborrheic keratosis: Secondary | ICD-10-CM | POA: Diagnosis not present

## 2015-06-28 DIAGNOSIS — E559 Vitamin D deficiency, unspecified: Secondary | ICD-10-CM | POA: Diagnosis not present

## 2015-06-28 DIAGNOSIS — Z6830 Body mass index (BMI) 30.0-30.9, adult: Secondary | ICD-10-CM | POA: Diagnosis not present

## 2015-06-28 DIAGNOSIS — Z23 Encounter for immunization: Secondary | ICD-10-CM | POA: Diagnosis not present

## 2015-06-28 DIAGNOSIS — J449 Chronic obstructive pulmonary disease, unspecified: Secondary | ICD-10-CM | POA: Diagnosis not present

## 2015-06-28 DIAGNOSIS — Z1389 Encounter for screening for other disorder: Secondary | ICD-10-CM | POA: Diagnosis not present

## 2015-06-28 DIAGNOSIS — Z0000 Encounter for general adult medical examination without abnormal findings: Secondary | ICD-10-CM | POA: Diagnosis not present

## 2015-06-28 DIAGNOSIS — I6529 Occlusion and stenosis of unspecified carotid artery: Secondary | ICD-10-CM | POA: Diagnosis not present

## 2015-06-28 DIAGNOSIS — I10 Essential (primary) hypertension: Secondary | ICD-10-CM | POA: Diagnosis not present

## 2015-06-28 DIAGNOSIS — E119 Type 2 diabetes mellitus without complications: Secondary | ICD-10-CM | POA: Diagnosis not present

## 2015-06-28 DIAGNOSIS — Z87891 Personal history of nicotine dependence: Secondary | ICD-10-CM | POA: Diagnosis not present

## 2015-06-28 DIAGNOSIS — Z125 Encounter for screening for malignant neoplasm of prostate: Secondary | ICD-10-CM | POA: Diagnosis not present

## 2015-06-28 DIAGNOSIS — R05 Cough: Secondary | ICD-10-CM | POA: Diagnosis not present

## 2015-07-13 DIAGNOSIS — Z87891 Personal history of nicotine dependence: Secondary | ICD-10-CM | POA: Diagnosis not present

## 2015-09-26 DIAGNOSIS — N183 Chronic kidney disease, stage 3 (moderate): Secondary | ICD-10-CM | POA: Diagnosis not present

## 2015-09-26 DIAGNOSIS — I10 Essential (primary) hypertension: Secondary | ICD-10-CM | POA: Diagnosis not present

## 2015-09-26 DIAGNOSIS — I129 Hypertensive chronic kidney disease with stage 1 through stage 4 chronic kidney disease, or unspecified chronic kidney disease: Secondary | ICD-10-CM | POA: Diagnosis not present

## 2015-09-26 DIAGNOSIS — Z6831 Body mass index (BMI) 31.0-31.9, adult: Secondary | ICD-10-CM | POA: Diagnosis not present

## 2015-09-26 DIAGNOSIS — E119 Type 2 diabetes mellitus without complications: Secondary | ICD-10-CM | POA: Diagnosis not present

## 2015-10-24 DIAGNOSIS — Z6831 Body mass index (BMI) 31.0-31.9, adult: Secondary | ICD-10-CM | POA: Diagnosis not present

## 2015-10-24 DIAGNOSIS — L732 Hidradenitis suppurativa: Secondary | ICD-10-CM | POA: Diagnosis not present

## 2015-11-21 DIAGNOSIS — I6521 Occlusion and stenosis of right carotid artery: Secondary | ICD-10-CM | POA: Diagnosis not present

## 2015-11-22 DIAGNOSIS — J019 Acute sinusitis, unspecified: Secondary | ICD-10-CM | POA: Diagnosis not present

## 2015-11-22 DIAGNOSIS — Z6831 Body mass index (BMI) 31.0-31.9, adult: Secondary | ICD-10-CM | POA: Diagnosis not present

## 2015-11-30 DIAGNOSIS — I10 Essential (primary) hypertension: Secondary | ICD-10-CM | POA: Diagnosis not present

## 2015-11-30 DIAGNOSIS — I6521 Occlusion and stenosis of right carotid artery: Secondary | ICD-10-CM | POA: Diagnosis not present

## 2015-11-30 DIAGNOSIS — E119 Type 2 diabetes mellitus without complications: Secondary | ICD-10-CM | POA: Diagnosis not present

## 2015-11-30 DIAGNOSIS — I25119 Atherosclerotic heart disease of native coronary artery with unspecified angina pectoris: Secondary | ICD-10-CM | POA: Diagnosis not present

## 2015-12-04 DIAGNOSIS — L570 Actinic keratosis: Secondary | ICD-10-CM | POA: Diagnosis not present

## 2015-12-15 DIAGNOSIS — R0602 Shortness of breath: Secondary | ICD-10-CM | POA: Diagnosis not present

## 2015-12-15 DIAGNOSIS — I2510 Atherosclerotic heart disease of native coronary artery without angina pectoris: Secondary | ICD-10-CM | POA: Diagnosis not present

## 2015-12-15 DIAGNOSIS — R0789 Other chest pain: Secondary | ICD-10-CM | POA: Diagnosis not present

## 2015-12-29 DIAGNOSIS — I10 Essential (primary) hypertension: Secondary | ICD-10-CM | POA: Diagnosis not present

## 2015-12-29 DIAGNOSIS — Z6831 Body mass index (BMI) 31.0-31.9, adult: Secondary | ICD-10-CM | POA: Diagnosis not present

## 2015-12-29 DIAGNOSIS — E1140 Type 2 diabetes mellitus with diabetic neuropathy, unspecified: Secondary | ICD-10-CM | POA: Diagnosis not present

## 2015-12-29 DIAGNOSIS — E119 Type 2 diabetes mellitus without complications: Secondary | ICD-10-CM | POA: Diagnosis not present

## 2016-03-06 DIAGNOSIS — J019 Acute sinusitis, unspecified: Secondary | ICD-10-CM | POA: Diagnosis not present

## 2016-03-06 DIAGNOSIS — Z6831 Body mass index (BMI) 31.0-31.9, adult: Secondary | ICD-10-CM | POA: Diagnosis not present

## 2016-03-06 DIAGNOSIS — R52 Pain, unspecified: Secondary | ICD-10-CM | POA: Diagnosis not present

## 2016-03-15 DIAGNOSIS — J449 Chronic obstructive pulmonary disease, unspecified: Secondary | ICD-10-CM | POA: Diagnosis not present

## 2016-03-15 DIAGNOSIS — R05 Cough: Secondary | ICD-10-CM | POA: Diagnosis not present

## 2016-03-15 DIAGNOSIS — I129 Hypertensive chronic kidney disease with stage 1 through stage 4 chronic kidney disease, or unspecified chronic kidney disease: Secondary | ICD-10-CM | POA: Diagnosis not present

## 2016-03-15 DIAGNOSIS — E119 Type 2 diabetes mellitus without complications: Secondary | ICD-10-CM | POA: Diagnosis not present

## 2016-03-15 DIAGNOSIS — Z6829 Body mass index (BMI) 29.0-29.9, adult: Secondary | ICD-10-CM | POA: Diagnosis not present

## 2016-04-05 DIAGNOSIS — E119 Type 2 diabetes mellitus without complications: Secondary | ICD-10-CM | POA: Diagnosis not present

## 2016-04-05 DIAGNOSIS — I10 Essential (primary) hypertension: Secondary | ICD-10-CM | POA: Diagnosis not present

## 2016-04-05 DIAGNOSIS — J302 Other seasonal allergic rhinitis: Secondary | ICD-10-CM | POA: Diagnosis not present

## 2016-04-05 DIAGNOSIS — Z6831 Body mass index (BMI) 31.0-31.9, adult: Secondary | ICD-10-CM | POA: Diagnosis not present

## 2016-04-05 DIAGNOSIS — E784 Other hyperlipidemia: Secondary | ICD-10-CM | POA: Diagnosis not present

## 2016-04-29 DIAGNOSIS — H01004 Unspecified blepharitis left upper eyelid: Secondary | ICD-10-CM | POA: Diagnosis not present

## 2016-04-29 DIAGNOSIS — H5203 Hypermetropia, bilateral: Secondary | ICD-10-CM | POA: Diagnosis not present

## 2016-04-29 DIAGNOSIS — H01001 Unspecified blepharitis right upper eyelid: Secondary | ICD-10-CM | POA: Diagnosis not present

## 2016-04-29 DIAGNOSIS — H04123 Dry eye syndrome of bilateral lacrimal glands: Secondary | ICD-10-CM | POA: Diagnosis not present

## 2016-04-29 DIAGNOSIS — H524 Presbyopia: Secondary | ICD-10-CM | POA: Diagnosis not present

## 2016-04-29 DIAGNOSIS — H52223 Regular astigmatism, bilateral: Secondary | ICD-10-CM | POA: Diagnosis not present

## 2016-04-29 DIAGNOSIS — E119 Type 2 diabetes mellitus without complications: Secondary | ICD-10-CM | POA: Diagnosis not present

## 2016-05-21 DIAGNOSIS — I6521 Occlusion and stenosis of right carotid artery: Secondary | ICD-10-CM | POA: Diagnosis not present

## 2016-05-21 DIAGNOSIS — R0989 Other specified symptoms and signs involving the circulatory and respiratory systems: Secondary | ICD-10-CM | POA: Diagnosis not present

## 2016-05-30 DIAGNOSIS — I25119 Atherosclerotic heart disease of native coronary artery with unspecified angina pectoris: Secondary | ICD-10-CM | POA: Diagnosis not present

## 2016-05-30 DIAGNOSIS — E119 Type 2 diabetes mellitus without complications: Secondary | ICD-10-CM | POA: Diagnosis not present

## 2016-05-30 DIAGNOSIS — I6521 Occlusion and stenosis of right carotid artery: Secondary | ICD-10-CM | POA: Diagnosis not present

## 2016-05-30 DIAGNOSIS — I10 Essential (primary) hypertension: Secondary | ICD-10-CM | POA: Diagnosis not present

## 2016-06-05 DIAGNOSIS — L814 Other melanin hyperpigmentation: Secondary | ICD-10-CM | POA: Diagnosis not present

## 2016-06-05 DIAGNOSIS — D1801 Hemangioma of skin and subcutaneous tissue: Secondary | ICD-10-CM | POA: Diagnosis not present

## 2016-06-05 DIAGNOSIS — D235 Other benign neoplasm of skin of trunk: Secondary | ICD-10-CM | POA: Diagnosis not present

## 2016-06-05 DIAGNOSIS — L821 Other seborrheic keratosis: Secondary | ICD-10-CM | POA: Diagnosis not present

## 2016-06-05 DIAGNOSIS — L570 Actinic keratosis: Secondary | ICD-10-CM | POA: Diagnosis not present

## 2016-06-20 DIAGNOSIS — R946 Abnormal results of thyroid function studies: Secondary | ICD-10-CM | POA: Diagnosis not present

## 2016-06-20 DIAGNOSIS — E119 Type 2 diabetes mellitus without complications: Secondary | ICD-10-CM | POA: Diagnosis not present

## 2016-06-20 DIAGNOSIS — Z125 Encounter for screening for malignant neoplasm of prostate: Secondary | ICD-10-CM | POA: Diagnosis not present

## 2016-06-20 DIAGNOSIS — I10 Essential (primary) hypertension: Secondary | ICD-10-CM | POA: Diagnosis not present

## 2016-06-20 DIAGNOSIS — E784 Other hyperlipidemia: Secondary | ICD-10-CM | POA: Diagnosis not present

## 2016-06-20 DIAGNOSIS — E559 Vitamin D deficiency, unspecified: Secondary | ICD-10-CM | POA: Diagnosis not present

## 2016-06-28 DIAGNOSIS — M7061 Trochanteric bursitis, right hip: Secondary | ICD-10-CM | POA: Diagnosis not present

## 2016-06-28 DIAGNOSIS — M25511 Pain in right shoulder: Secondary | ICD-10-CM | POA: Diagnosis not present

## 2016-06-28 DIAGNOSIS — E119 Type 2 diabetes mellitus without complications: Secondary | ICD-10-CM | POA: Diagnosis not present

## 2016-06-28 DIAGNOSIS — I6529 Occlusion and stenosis of unspecified carotid artery: Secondary | ICD-10-CM | POA: Diagnosis not present

## 2016-06-28 DIAGNOSIS — J449 Chronic obstructive pulmonary disease, unspecified: Secondary | ICD-10-CM | POA: Diagnosis not present

## 2016-06-28 DIAGNOSIS — D692 Other nonthrombocytopenic purpura: Secondary | ICD-10-CM | POA: Diagnosis not present

## 2016-06-28 DIAGNOSIS — I10 Essential (primary) hypertension: Secondary | ICD-10-CM | POA: Diagnosis not present

## 2016-06-28 DIAGNOSIS — Z6830 Body mass index (BMI) 30.0-30.9, adult: Secondary | ICD-10-CM | POA: Diagnosis not present

## 2016-06-28 DIAGNOSIS — L739 Follicular disorder, unspecified: Secondary | ICD-10-CM | POA: Diagnosis not present

## 2016-06-28 DIAGNOSIS — E1140 Type 2 diabetes mellitus with diabetic neuropathy, unspecified: Secondary | ICD-10-CM | POA: Diagnosis not present

## 2016-06-28 DIAGNOSIS — Z1389 Encounter for screening for other disorder: Secondary | ICD-10-CM | POA: Diagnosis not present

## 2016-06-28 DIAGNOSIS — Z0000 Encounter for general adult medical examination without abnormal findings: Secondary | ICD-10-CM | POA: Diagnosis not present

## 2016-07-23 DIAGNOSIS — M25511 Pain in right shoulder: Secondary | ICD-10-CM | POA: Diagnosis not present

## 2016-07-26 DIAGNOSIS — M1711 Unilateral primary osteoarthritis, right knee: Secondary | ICD-10-CM | POA: Diagnosis not present

## 2016-07-26 DIAGNOSIS — M7061 Trochanteric bursitis, right hip: Secondary | ICD-10-CM | POA: Diagnosis not present

## 2016-08-13 DIAGNOSIS — S43431D Superior glenoid labrum lesion of right shoulder, subsequent encounter: Secondary | ICD-10-CM | POA: Diagnosis not present

## 2016-08-13 DIAGNOSIS — M25511 Pain in right shoulder: Secondary | ICD-10-CM | POA: Diagnosis not present

## 2016-09-24 DIAGNOSIS — S43431D Superior glenoid labrum lesion of right shoulder, subsequent encounter: Secondary | ICD-10-CM | POA: Diagnosis not present

## 2016-09-24 DIAGNOSIS — M19011 Primary osteoarthritis, right shoulder: Secondary | ICD-10-CM | POA: Diagnosis not present

## 2016-10-01 DIAGNOSIS — M19011 Primary osteoarthritis, right shoulder: Secondary | ICD-10-CM | POA: Diagnosis not present

## 2016-10-01 DIAGNOSIS — S43431D Superior glenoid labrum lesion of right shoulder, subsequent encounter: Secondary | ICD-10-CM | POA: Diagnosis not present

## 2016-10-01 DIAGNOSIS — M25511 Pain in right shoulder: Secondary | ICD-10-CM | POA: Diagnosis not present

## 2016-10-02 DIAGNOSIS — E119 Type 2 diabetes mellitus without complications: Secondary | ICD-10-CM | POA: Diagnosis not present

## 2016-10-02 DIAGNOSIS — J449 Chronic obstructive pulmonary disease, unspecified: Secondary | ICD-10-CM | POA: Diagnosis not present

## 2016-10-02 DIAGNOSIS — Z6830 Body mass index (BMI) 30.0-30.9, adult: Secondary | ICD-10-CM | POA: Diagnosis not present

## 2016-10-02 DIAGNOSIS — I25118 Atherosclerotic heart disease of native coronary artery with other forms of angina pectoris: Secondary | ICD-10-CM | POA: Diagnosis not present

## 2016-10-02 DIAGNOSIS — M25519 Pain in unspecified shoulder: Secondary | ICD-10-CM | POA: Diagnosis not present

## 2016-10-02 DIAGNOSIS — E784 Other hyperlipidemia: Secondary | ICD-10-CM | POA: Diagnosis not present

## 2016-10-02 DIAGNOSIS — I10 Essential (primary) hypertension: Secondary | ICD-10-CM | POA: Diagnosis not present

## 2016-10-29 DIAGNOSIS — L570 Actinic keratosis: Secondary | ICD-10-CM | POA: Diagnosis not present

## 2016-10-29 DIAGNOSIS — L821 Other seborrheic keratosis: Secondary | ICD-10-CM | POA: Diagnosis not present

## 2016-10-29 DIAGNOSIS — L814 Other melanin hyperpigmentation: Secondary | ICD-10-CM | POA: Diagnosis not present

## 2016-10-29 DIAGNOSIS — L219 Seborrheic dermatitis, unspecified: Secondary | ICD-10-CM | POA: Diagnosis not present

## 2016-11-04 ENCOUNTER — Encounter (HOSPITAL_COMMUNITY)
Admission: RE | Disposition: A | Discharge status: Home / Self Care | Payer: Self-pay | Source: Ambulatory Visit | Attending: Orthopedic Surgery

## 2016-11-04 ENCOUNTER — Ambulatory Visit (HOSPITAL_COMMUNITY)
Admission: RE | Admit: 2016-11-04 | Discharge: 2016-11-04 | Disposition: A | Discharge status: Home / Self Care | Payer: Medicare Other | Source: Ambulatory Visit | Attending: Orthopedic Surgery | Admitting: Orthopedic Surgery

## 2016-11-04 ENCOUNTER — Other Ambulatory Visit: Payer: Self-pay

## 2016-11-04 ENCOUNTER — Ambulatory Visit (HOSPITAL_COMMUNITY): Payer: Medicare Other | Admitting: Certified Registered"

## 2016-11-04 ENCOUNTER — Encounter (HOSPITAL_COMMUNITY): Payer: Self-pay | Admitting: *Deleted

## 2016-11-04 DIAGNOSIS — Z7984 Long term (current) use of oral hypoglycemic drugs: Secondary | ICD-10-CM | POA: Diagnosis not present

## 2016-11-04 DIAGNOSIS — L089 Local infection of the skin and subcutaneous tissue, unspecified: Secondary | ICD-10-CM | POA: Diagnosis present

## 2016-11-04 DIAGNOSIS — Z955 Presence of coronary angioplasty implant and graft: Secondary | ICD-10-CM | POA: Insufficient documentation

## 2016-11-04 DIAGNOSIS — E119 Type 2 diabetes mellitus without complications: Secondary | ICD-10-CM | POA: Insufficient documentation

## 2016-11-04 DIAGNOSIS — Z7902 Long term (current) use of antithrombotics/antiplatelets: Secondary | ICD-10-CM | POA: Insufficient documentation

## 2016-11-04 DIAGNOSIS — K219 Gastro-esophageal reflux disease without esophagitis: Secondary | ICD-10-CM | POA: Diagnosis not present

## 2016-11-04 DIAGNOSIS — Z79899 Other long term (current) drug therapy: Secondary | ICD-10-CM | POA: Insufficient documentation

## 2016-11-04 DIAGNOSIS — Z7982 Long term (current) use of aspirin: Secondary | ICD-10-CM | POA: Diagnosis not present

## 2016-11-04 DIAGNOSIS — L03114 Cellulitis of left upper limb: Secondary | ICD-10-CM | POA: Diagnosis not present

## 2016-11-04 DIAGNOSIS — M65841 Other synovitis and tenosynovitis, right hand: Secondary | ICD-10-CM | POA: Diagnosis not present

## 2016-11-04 DIAGNOSIS — M65041 Abscess of tendon sheath, right hand: Secondary | ICD-10-CM | POA: Insufficient documentation

## 2016-11-04 DIAGNOSIS — M7062 Trochanteric bursitis, left hip: Secondary | ICD-10-CM | POA: Diagnosis not present

## 2016-11-04 DIAGNOSIS — L02511 Cutaneous abscess of right hand: Secondary | ICD-10-CM | POA: Diagnosis not present

## 2016-11-04 DIAGNOSIS — I2510 Atherosclerotic heart disease of native coronary artery without angina pectoris: Secondary | ICD-10-CM | POA: Diagnosis not present

## 2016-11-04 DIAGNOSIS — Z87891 Personal history of nicotine dependence: Secondary | ICD-10-CM | POA: Diagnosis not present

## 2016-11-04 DIAGNOSIS — L03011 Cellulitis of right finger: Secondary | ICD-10-CM | POA: Diagnosis not present

## 2016-11-04 DIAGNOSIS — I10 Essential (primary) hypertension: Secondary | ICD-10-CM | POA: Insufficient documentation

## 2016-11-04 DIAGNOSIS — K922 Gastrointestinal hemorrhage, unspecified: Secondary | ICD-10-CM | POA: Diagnosis not present

## 2016-11-04 DIAGNOSIS — L02512 Cutaneous abscess of left hand: Secondary | ICD-10-CM | POA: Diagnosis not present

## 2016-11-04 DIAGNOSIS — Z6830 Body mass index (BMI) 30.0-30.9, adult: Secondary | ICD-10-CM | POA: Diagnosis not present

## 2016-11-04 HISTORY — DX: Other disorders of plasma-protein metabolism, not elsewhere classified: E88.09

## 2016-11-04 HISTORY — PX: I&D EXTREMITY: SHX5045

## 2016-11-04 HISTORY — PX: I & D EXTREMITY: SHX5045

## 2016-11-04 HISTORY — DX: Lipid storage disorder, unspecified: E75.6

## 2016-11-04 LAB — COMPREHENSIVE METABOLIC PANEL
ALBUMIN: 4.3 g/dL (ref 3.5–5.0)
ALK PHOS: 69 U/L (ref 38–126)
ALT: 48 U/L (ref 17–63)
ANION GAP: 11 (ref 5–15)
AST: 76 U/L — ABNORMAL HIGH (ref 15–41)
BILIRUBIN TOTAL: 2.5 mg/dL — AB (ref 0.3–1.2)
BUN: 23 mg/dL — AB (ref 6–20)
CALCIUM: 9.6 mg/dL (ref 8.9–10.3)
CO2: 20 mmol/L — ABNORMAL LOW (ref 22–32)
Chloride: 104 mmol/L (ref 101–111)
Creatinine, Ser: 1.45 mg/dL — ABNORMAL HIGH (ref 0.61–1.24)
GFR calc Af Amer: 54 mL/min — ABNORMAL LOW (ref >60)
GFR, EST NON AFRICAN AMERICAN: 47 mL/min — AB (ref >60)
GLUCOSE: 110 mg/dL — AB (ref 65–99)
POTASSIUM: 6.7 mmol/L — AB (ref 3.5–5.1)
Sodium: 135 mmol/L (ref 135–145)
TOTAL PROTEIN: 7.2 g/dL (ref 6.5–8.1)

## 2016-11-04 LAB — CBC
HEMATOCRIT: 44.7 % (ref 39.0–52.0)
HEMOGLOBIN: 15.6 g/dL (ref 13.0–17.0)
MCH: 33.8 pg (ref 26.0–34.0)
MCHC: 34.9 g/dL (ref 30.0–36.0)
MCV: 97 fL (ref 78.0–100.0)
Platelets: 236 10*3/uL (ref 150–400)
RBC: 4.61 MIL/uL (ref 4.22–5.81)
RDW: 13.3 % (ref 11.5–15.5)
WBC: 10.5 10*3/uL (ref 4.0–10.5)

## 2016-11-04 LAB — POCT I-STAT 4, (NA,K, GLUC, HGB,HCT)
Glucose, Bld: 103 mg/dL — ABNORMAL HIGH (ref 65–99)
HCT: 42 % (ref 39.0–52.0)
Hemoglobin: 14.3 g/dL (ref 13.0–17.0)
POTASSIUM: 3.7 mmol/L (ref 3.5–5.1)
Sodium: 139 mmol/L (ref 135–145)

## 2016-11-04 LAB — GLUCOSE, CAPILLARY
GLUCOSE-CAPILLARY: 117 mg/dL — AB (ref 65–99)
GLUCOSE-CAPILLARY: 97 mg/dL (ref 65–99)

## 2016-11-04 SURGERY — IRRIGATION AND DEBRIDEMENT EXTREMITY
Anesthesia: General | Laterality: Right

## 2016-11-04 MED ORDER — 0.9 % SODIUM CHLORIDE (POUR BTL) OPTIME
TOPICAL | Status: DC | PRN
  Administered 2016-11-04: 1000 mL

## 2016-11-04 MED ORDER — ONDANSETRON HCL 4 MG/2ML IJ SOLN: 4.0000 mg | Freq: Once | INTRAMUSCULAR | Status: DC | PRN

## 2016-11-04 MED ORDER — HYDROMORPHONE HCL 1 MG/ML IJ SOLN: 0.2500 mg | INTRAMUSCULAR | Status: DC | PRN

## 2016-11-04 MED ORDER — BUPIVACAINE HCL (PF) 0.25 % IJ SOLN
INTRAMUSCULAR | Status: DC | PRN
  Administered 2016-11-04: 10 mL

## 2016-11-04 MED ORDER — CEFAZOLIN SODIUM-DEXTROSE 2-4 GM/100ML-% IV SOLN: 2.0000 g | INTRAVENOUS | Status: DC

## 2016-11-04 MED ORDER — LIDOCAINE 2% (20 MG/ML) 5 ML SYRINGE
INTRAMUSCULAR | Status: AC
  Filled 2016-11-04: qty 10

## 2016-11-04 MED ORDER — CHLORHEXIDINE GLUCONATE 4 % EX LIQD: 60.0000 mL | Freq: Once | CUTANEOUS | Status: DC

## 2016-11-04 MED ORDER — OXYCODONE HCL 5 MG PO TABS: 5.0000 mg | ORAL_TABLET | Freq: Once | ORAL | Status: DC | PRN

## 2016-11-04 MED ORDER — PROPOFOL 10 MG/ML IV BOLUS
INTRAVENOUS | Status: AC
  Filled 2016-11-04: qty 20

## 2016-11-04 MED ORDER — LACTATED RINGERS IV SOLN
INTRAVENOUS | Status: DC
  Administered 2016-11-04 (×2): via INTRAVENOUS

## 2016-11-04 MED ORDER — LIDOCAINE HCL (PF) 1 % IJ SOLN
INTRAMUSCULAR | Status: DC | PRN
  Administered 2016-11-04: 10 mL

## 2016-11-04 MED ORDER — FENTANYL CITRATE (PF) 100 MCG/2ML IJ SOLN
INTRAMUSCULAR | Status: DC | PRN
  Administered 2016-11-04: 50 ug via INTRAVENOUS
  Administered 2016-11-04 (×2): 25 ug via INTRAVENOUS

## 2016-11-04 MED ORDER — OXYCODONE HCL 5 MG PO TABS: 5.0000 mg | ORAL_TABLET | ORAL | 0 refills | Status: DC | PRN

## 2016-11-04 MED ORDER — BUPIVACAINE HCL (PF) 0.25 % IJ SOLN
INTRAMUSCULAR | Status: AC
  Filled 2016-11-04: qty 30

## 2016-11-04 MED ORDER — LIDOCAINE HCL (PF) 1 % IJ SOLN
INTRAMUSCULAR | Status: AC
  Filled 2016-11-04: qty 30

## 2016-11-04 MED ORDER — FENTANYL CITRATE (PF) 100 MCG/2ML IJ SOLN
INTRAMUSCULAR | Status: AC
  Filled 2016-11-04: qty 2

## 2016-11-04 MED ORDER — LIDOCAINE HCL (CARDIAC) 20 MG/ML IV SOLN
INTRAVENOUS | Status: DC | PRN
  Administered 2016-11-04: 100 mg via INTRAVENOUS

## 2016-11-04 MED ORDER — OXYCODONE HCL 5 MG/5ML PO SOLN: 5.0000 mg | Freq: Once | ORAL | Status: DC | PRN

## 2016-11-04 MED ORDER — PROPOFOL 10 MG/ML IV BOLUS
INTRAVENOUS | Status: DC | PRN
  Administered 2016-11-04 (×2): 30 mg via INTRAVENOUS
  Administered 2016-11-04 (×2): 50 mg via INTRAVENOUS

## 2016-11-04 SURGICAL SUPPLY — 65 items
BANDAGE ACE 4X5 VEL STRL LF (GAUZE/BANDAGES/DRESSINGS) ×2 IMPLANT
BANDAGE ELASTIC 3 VELCRO ST LF (GAUZE/BANDAGES/DRESSINGS) ×2 IMPLANT
BANDAGE ELASTIC 4 VELCRO ST LF (GAUZE/BANDAGES/DRESSINGS) ×2 IMPLANT
BNDG COHESIVE 1X5 TAN STRL LF (GAUZE/BANDAGES/DRESSINGS) IMPLANT
BNDG CONFORM 2 STRL LF (GAUZE/BANDAGES/DRESSINGS) IMPLANT
BNDG CONFORM 3 STRL LF (GAUZE/BANDAGES/DRESSINGS) ×2 IMPLANT
BNDG ESMARK 4X9 LF (GAUZE/BANDAGES/DRESSINGS) ×2 IMPLANT
BNDG GAUZE ELAST 4 BULKY (GAUZE/BANDAGES/DRESSINGS) ×2 IMPLANT
CORDS BIPOLAR (ELECTRODE) ×2 IMPLANT
COVER SURGICAL LIGHT HANDLE (MISCELLANEOUS) ×2 IMPLANT
CUFF TOURNIQUET SINGLE 18IN (TOURNIQUET CUFF) ×2 IMPLANT
CUFF TOURNIQUET SINGLE 24IN (TOURNIQUET CUFF) IMPLANT
DRAIN PENROSE 1/4X12 LTX STRL (WOUND CARE) IMPLANT
DRAPE SURG 17X23 STRL (DRAPES) ×2 IMPLANT
DRSG ADAPTIC 3X8 NADH LF (GAUZE/BANDAGES/DRESSINGS) ×2 IMPLANT
DRSG EMULSION OIL 3X3 NADH (GAUZE/BANDAGES/DRESSINGS) ×2 IMPLANT
ELECT REM PT RETURN 9FT ADLT (ELECTROSURGICAL)
ELECTRODE REM PT RTRN 9FT ADLT (ELECTROSURGICAL) IMPLANT
GAUZE PACKING IODOFORM 1/4X15 (GAUZE/BANDAGES/DRESSINGS) ×2 IMPLANT
GAUZE SPONGE 4X4 12PLY STRL (GAUZE/BANDAGES/DRESSINGS) ×2 IMPLANT
GAUZE XEROFORM 1X8 LF (GAUZE/BANDAGES/DRESSINGS) ×2 IMPLANT
GAUZE XEROFORM 5X9 LF (GAUZE/BANDAGES/DRESSINGS) IMPLANT
GLOVE BIOGEL PI IND STRL 7.0 (GLOVE) ×2 IMPLANT
GLOVE BIOGEL PI IND STRL 8.5 (GLOVE) ×1 IMPLANT
GLOVE BIOGEL PI INDICATOR 7.0 (GLOVE) ×2
GLOVE BIOGEL PI INDICATOR 8.5 (GLOVE) ×1
GLOVE SURG ORTHO 8.0 STRL STRW (GLOVE) ×2 IMPLANT
GLOVE SURG SS PI 6.5 STRL IVOR (GLOVE) ×2 IMPLANT
GOWN STRL REUS W/ TWL LRG LVL3 (GOWN DISPOSABLE) ×3 IMPLANT
GOWN STRL REUS W/ TWL XL LVL3 (GOWN DISPOSABLE) ×1 IMPLANT
GOWN STRL REUS W/TWL LRG LVL3 (GOWN DISPOSABLE) ×3
GOWN STRL REUS W/TWL XL LVL3 (GOWN DISPOSABLE) ×1
HANDPIECE INTERPULSE COAX TIP (DISPOSABLE)
KIT BASIN OR (CUSTOM PROCEDURE TRAY) ×2 IMPLANT
KIT ROOM TURNOVER OR (KITS) ×2 IMPLANT
MANIFOLD NEPTUNE II (INSTRUMENTS) ×2 IMPLANT
NEEDLE HYPO 25GX1X1/2 BEV (NEEDLE) IMPLANT
NS IRRIG 1000ML POUR BTL (IV SOLUTION) ×2 IMPLANT
PACK ORTHO EXTREMITY (CUSTOM PROCEDURE TRAY) ×2 IMPLANT
PAD ARMBOARD 7.5X6 YLW CONV (MISCELLANEOUS) ×4 IMPLANT
PAD CAST 3X4 CTTN HI CHSV (CAST SUPPLIES) ×1 IMPLANT
PAD CAST 4YDX4 CTTN HI CHSV (CAST SUPPLIES) ×1 IMPLANT
PADDING CAST COTTON 3X4 STRL (CAST SUPPLIES) ×1
PADDING CAST COTTON 4X4 STRL (CAST SUPPLIES) ×1
SET HNDPC FAN SPRY TIP SCT (DISPOSABLE) IMPLANT
SOAP 2 % CHG 4 OZ (WOUND CARE) ×2 IMPLANT
SPLINT FIBERGLASS 3X12 (CAST SUPPLIES) ×2 IMPLANT
SPONGE LAP 18X18 X RAY DECT (DISPOSABLE) ×2 IMPLANT
SPONGE LAP 4X18 X RAY DECT (DISPOSABLE) ×2 IMPLANT
SUCTION FRAZIER HANDLE 10FR (MISCELLANEOUS) ×1
SUCTION TUBE FRAZIER 10FR DISP (MISCELLANEOUS) ×1 IMPLANT
SUT ETHILON 4 0 PS 2 18 (SUTURE) IMPLANT
SUT ETHILON 5 0 P 3 18 (SUTURE)
SUT NYLON ETHILON 5-0 P-3 1X18 (SUTURE) IMPLANT
SUT PROLENE 4 0 TF (SUTURE) ×2 IMPLANT
SWAB COLLECTION DEVICE MRSA (MISCELLANEOUS) ×2 IMPLANT
SWAB CULTURE ESWAB REG 1ML (MISCELLANEOUS) ×2 IMPLANT
SYR CONTROL 10ML LL (SYRINGE) IMPLANT
TOWEL OR 17X24 6PK STRL BLUE (TOWEL DISPOSABLE) ×2 IMPLANT
TOWEL OR 17X26 10 PK STRL BLUE (TOWEL DISPOSABLE) ×2 IMPLANT
TUBE ANAEROBIC SPECIMEN COL (MISCELLANEOUS) IMPLANT
TUBE CONNECTING 12X1/4 (SUCTIONS) ×2 IMPLANT
UNDERPAD 30X30 (UNDERPADS AND DIAPERS) ×2 IMPLANT
WATER STERILE IRR 1000ML POUR (IV SOLUTION) ×2 IMPLANT
YANKAUER SUCT BULB TIP NO VENT (SUCTIONS) ×2 IMPLANT

## 2016-11-04 NOTE — Discharge Instructions (Signed)
KEEP BANDAGE CLEAN AND DRY CALL OFFICE FOR F/U APPT [phone] in 3 days KEEP HAND ELEVATED ABOVE HEART OK TO APPLY ICE TO OPERATIVE AREA CONTACT OFFICE IF ANY WORSENING PAIN OR CONCERNS. 

## 2016-11-04 NOTE — Progress Notes (Signed)
Lab called with a Potassium level of 6.7, but specimen was hemolyzed. Dr Linna Caprice called and ordered an I stat. Order placed for I stat

## 2016-11-04 NOTE — Anesthesia Postprocedure Evaluation (Signed)
Anesthesia Post Note  Patient: Miguel Brock  Procedure(s) Performed: Procedure(s) (LRB): IRRIGATION AND DEBRIDEMENT RIGHT INDEX FINGER (Right)  Patient location during evaluation: PACU Anesthesia Type: MAC Level of consciousness: awake, awake and alert and oriented Pain management: pain level controlled Vital Signs Assessment: post-procedure vital signs reviewed and stable Respiratory status: nonlabored ventilation, spontaneous breathing and respiratory function stable Cardiovascular status: blood pressure returned to baseline Anesthetic complications: no    Last Vitals:  Vitals:   11/04/16 1755 11/04/16 1806  BP: (!) 166/83 (!) 179/83  Pulse: (!) 59 (!) 59  Resp: 16 (!) 24  Temp: 36.5 C 36.4 C    Last Pain:  Vitals:   11/04/16 1806  TempSrc:   PainSc: 0-No pain                 Shallon Yaklin COKER
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
## 2016-11-04 NOTE — Anesthesia Preprocedure Evaluation (Addendum)
Anesthesia Evaluation  Patient identified by MRN, date of birth, ID band Patient awake    Reviewed: Allergy & Precautions, NPO status , Patient's Chart, lab work & pertinent test results  Airway Mallampati: II  TM Distance: >3 FB Neck ROM: Full    Dental  (+) Teeth Intact, Dental Advisory Given   Pulmonary former smoker,  breath sounds clear to auscultation        Cardiovascular hypertension, Rhythm:Regular Rate:Normal     Neuro/Psych    GI/Hepatic   Endo/Other  diabetes  Renal/GU      Musculoskeletal   Abdominal   Peds  Hematology   Anesthesia Other Findings   Reproductive/Obstetrics                             Anesthesia Physical Anesthesia Plan  ASA: III  Anesthesia Plan: General   Post-op Pain Management:    Induction: Intravenous  Airway Management Planned: LMA  Additional Equipment:   Intra-op Plan:   Post-operative Plan:   Informed Consent: I have reviewed the patients History and Physical, chart, labs and discussed the procedure including the risks, benefits and alternatives for the proposed anesthesia with the patient or authorized representative who has indicated his/her understanding and acceptance.   Dental advisory given  Plan Discussed with: CRNA and Anesthesiologist  Anesthesia Plan Comments:         Anesthesia Quick Evaluation  

## 2016-11-04 NOTE — Transfer of Care (Signed)
Immediate Anesthesia Transfer of Care Note  Patient: AVINASH GRIFFEE  Procedure(s) Performed: Procedure(s): IRRIGATION AND DEBRIDEMENT RIGHT INDEX FINGER (Right)  Patient Location: PACU  Anesthesia Type:MAC combined with regional for post-op pain  Level of Consciousness: awake, alert  and oriented  Airway & Oxygen Therapy: Patient Spontanous Breathing  Post-op Assessment: Report given to RN, Post -op Vital signs reviewed and stable and Patient moving all extremities X 4  Post vital signs: Reviewed and stable  Last Vitals:  Vitals:   11/04/16 1534 11/04/16 1755  BP: (!) 144/59   Pulse: (!) 58   Resp: 18   Temp: 37.1 C 36.5 C    Last Pain:  Vitals:   11/04/16 1531  TempSrc: Oral         Complications: No apparent anesthesia complications

## 2016-11-05 ENCOUNTER — Encounter (HOSPITAL_COMMUNITY): Payer: Self-pay | Admitting: Orthopedic Surgery

## 2016-11-05 NOTE — Op Note (Signed)
NAME:  Miguel Brock, Miguel Brock.:  [id number]  MEDICAL RECORD NO.:  [id number]  LOCATION:  [facility]                         FACILITY:  [facility]  PHYSICIAN:  Linna Hoff IV, M.D.DATE OF BIRTH:  November 09, 1946  DATE OF PROCEDURE:  11/04/2016 DATE OF DISCHARGE:  11/04/2016                              OPERATIVE REPORT   PREOPERATIVE DIAGNOSIS:  Right index finger infection, deep space infection.  POSTOPERATIVE DIAGNOSIS:  Right index finger infection, deep space infection.  ATTENDING SURGEON:  Linna Hoff, M.D., who scrubbed and present for the entire procedure.  ASSISTANT SURGEON:  None.  ANESTHESIA:  1% Xylocaine and 0.25% Marcaine local with IV sedation.  SURGICAL PROCEDURE: 1. Right index finger extensor tenosynovectomy extending from the     dorsum of the hand into the finger. 2. Right index finger and drainage of complex abscess, dorsum of     finger.  SURGICAL INDICATIONS:  Mr. Kitzmann is a 70 year old gentleman, who had persistent draining wound over the dorsal aspect of the index finger. He had been poking at it with instruments at home and continued to have a worsening infection.  It was recommended he undergo the above procedure.  Risks, benefits, and alternatives were discussed in detail with the patient.  Signed informed consent was obtained.  Risks include, but not limited to bleeding; infection; damage to nearby nerves, arteries, or tendons; loss of motion of wrist and digits; incomplete relief of symptoms, and need for further surgical intervention.  DESCRIPTION OF PROCEDURE:  The patient was properly identified in the preoperative holding area, marked with a permanent marker on the right index finger to indicate the correct operative site.  The patient was brought back to the operating room, placed supine on anesthesia table, where the IV sedation was administered.  The patient tolerated this well.  The local anesthetic was administered.  A  well-padded tourniquet placed on the right forearm and sealed with 1000 drape.  Right upper extremity was then prepped and draped in normal sterile fashion.  Time- out was called.  Correct site was identified, and procedure then begun. Attention was then turned to the index finger.  The tourniquet was then insufflated.  Longitudinal incision made directly over the dorsal aspect of the index finger.  Dissection was carried down through skin and subcutaneous tissue.  The deep space abscess was then opened up and excision of the devitalized tissue was then done over the dorsal aspect of the index finger.  Further exposure then carried up proximally and distally to expose the entire extensor mechanism, and extensor tenosynovectomy was then carried out along the course of the entire extensor tendon from the MP joint all the way out to the PIP joint.  The wound was then thoroughly irrigated.  After tenosynovectomy and drainage of the abscess area, the wound was then irrigated.  Closed over a small packing gauze with a 4-0 Prolene suture.  Adaptic dressing and sterile compressive bandage applied.  The patient then placed in a well-padded volar splint and taken to recovery room in good condition.  POSTPROCEDURE PLAN:  The patient will be discharged home, seen back in the office in approximately 3 days for wound check,  packing change, and then begin an outpatient wound care regimen.  Follow him closely.  Wound cultures were taken and will need to follow those wound cultures at each visit.     Melrose Nakayama, M.D.     FWO/MEDQ  D:  11/04/2016  T:  11/05/2016  Job:  [id number]

## 2016-11-07 DIAGNOSIS — L03011 Cellulitis of right finger: Secondary | ICD-10-CM | POA: Diagnosis not present

## 2016-11-07 DIAGNOSIS — Z4789 Encounter for other orthopedic aftercare: Secondary | ICD-10-CM | POA: Diagnosis not present

## 2016-11-07 DIAGNOSIS — M79644 Pain in right finger(s): Secondary | ICD-10-CM | POA: Diagnosis not present

## 2016-11-07 NOTE — Anesthesia Postprocedure Evaluation (Signed)
Anesthesia Post Note  Patient: Miguel Brock  Procedure(s) Performed: Procedure(s) (LRB): IRRIGATION AND DEBRIDEMENT RIGHT INDEX FINGER (Right)  Anesthesia Post Evaluation  Last Vitals:  Vitals:   11/04/16 1755 11/04/16 1806  BP: (!) 166/83 (!) 179/83  Pulse: (!) 59 (!) 59  Resp: 16 (!) 24  Temp: 36.5 C 36.4 C    Last Pain:  Vitals:   11/04/16 1806  TempSrc:   PainSc: 0-No pain                 Sophiagrace Benbrook COKER

## 2016-11-09 LAB — AEROBIC/ANAEROBIC CULTURE W GRAM STAIN (SURGICAL/DEEP WOUND)

## 2016-11-09 LAB — AEROBIC/ANAEROBIC CULTURE (SURGICAL/DEEP WOUND)

## 2016-11-11 DIAGNOSIS — M79644 Pain in right finger(s): Secondary | ICD-10-CM | POA: Diagnosis not present

## 2016-11-11 DIAGNOSIS — Z4789 Encounter for other orthopedic aftercare: Secondary | ICD-10-CM | POA: Diagnosis not present

## 2016-11-11 DIAGNOSIS — L03011 Cellulitis of right finger: Secondary | ICD-10-CM | POA: Diagnosis not present

## 2016-11-18 DIAGNOSIS — L03011 Cellulitis of right finger: Secondary | ICD-10-CM | POA: Diagnosis not present

## 2016-11-18 DIAGNOSIS — Z4789 Encounter for other orthopedic aftercare: Secondary | ICD-10-CM | POA: Diagnosis not present

## 2016-11-19 DIAGNOSIS — I6523 Occlusion and stenosis of bilateral carotid arteries: Secondary | ICD-10-CM | POA: Diagnosis not present

## 2016-12-16 DIAGNOSIS — Z6831 Body mass index (BMI) 31.0-31.9, adult: Secondary | ICD-10-CM | POA: Diagnosis not present

## 2016-12-16 DIAGNOSIS — J3489 Other specified disorders of nose and nasal sinuses: Secondary | ICD-10-CM | POA: Diagnosis not present

## 2016-12-31 DIAGNOSIS — J029 Acute pharyngitis, unspecified: Secondary | ICD-10-CM | POA: Diagnosis not present

## 2016-12-31 DIAGNOSIS — R05 Cough: Secondary | ICD-10-CM | POA: Diagnosis not present

## 2016-12-31 DIAGNOSIS — J069 Acute upper respiratory infection, unspecified: Secondary | ICD-10-CM | POA: Diagnosis not present

## 2017-01-10 ENCOUNTER — Other Ambulatory Visit: Payer: Self-pay | Admitting: Internal Medicine

## 2017-01-10 DIAGNOSIS — I10 Essential (primary) hypertension: Secondary | ICD-10-CM | POA: Diagnosis not present

## 2017-01-10 DIAGNOSIS — R070 Pain in throat: Secondary | ICD-10-CM

## 2017-01-10 DIAGNOSIS — I25118 Atherosclerotic heart disease of native coronary artery with other forms of angina pectoris: Secondary | ICD-10-CM | POA: Diagnosis not present

## 2017-01-10 DIAGNOSIS — E119 Type 2 diabetes mellitus without complications: Secondary | ICD-10-CM | POA: Diagnosis not present

## 2017-01-10 DIAGNOSIS — Z6831 Body mass index (BMI) 31.0-31.9, adult: Secondary | ICD-10-CM | POA: Diagnosis not present

## 2017-01-10 DIAGNOSIS — E784 Other hyperlipidemia: Secondary | ICD-10-CM | POA: Diagnosis not present

## 2017-01-14 ENCOUNTER — Ambulatory Visit
Admission: RE | Admit: 2017-01-14 | Discharge: 2017-01-14 | Disposition: A | Discharge status: Home / Self Care | Payer: Medicare Other | Source: Ambulatory Visit | Attending: Internal Medicine | Admitting: Internal Medicine

## 2017-01-14 DIAGNOSIS — R070 Pain in throat: Secondary | ICD-10-CM

## 2017-01-14 DIAGNOSIS — J029 Acute pharyngitis, unspecified: Secondary | ICD-10-CM | POA: Diagnosis not present

## 2017-01-14 MED ORDER — IOPAMIDOL (ISOVUE-300) INJECTION 61%
75.0000 mL | Freq: Once | INTRAVENOUS | Status: AC | PRN
  Administered 2017-01-14: 75 mL via INTRAVENOUS

## 2017-01-17 ENCOUNTER — Other Ambulatory Visit: Payer: Self-pay | Admitting: Internal Medicine

## 2017-01-17 DIAGNOSIS — I6529 Occlusion and stenosis of unspecified carotid artery: Secondary | ICD-10-CM

## 2017-01-21 ENCOUNTER — Other Ambulatory Visit: Payer: Self-pay | Admitting: Internal Medicine

## 2017-01-21 DIAGNOSIS — I6529 Occlusion and stenosis of unspecified carotid artery: Secondary | ICD-10-CM

## 2017-01-24 ENCOUNTER — Other Ambulatory Visit: Payer: Medicare Other

## 2017-01-24 ENCOUNTER — Ambulatory Visit
Admission: RE | Admit: 2017-01-24 | Discharge: 2017-01-24 | Disposition: A | Discharge status: Home / Self Care | Payer: Medicare Other | Source: Ambulatory Visit | Attending: Internal Medicine | Admitting: Internal Medicine

## 2017-01-24 DIAGNOSIS — I6529 Occlusion and stenosis of unspecified carotid artery: Secondary | ICD-10-CM

## 2017-01-24 DIAGNOSIS — I6523 Occlusion and stenosis of bilateral carotid arteries: Secondary | ICD-10-CM | POA: Diagnosis not present

## 2017-01-24 MED ORDER — IOPAMIDOL (ISOVUE-370) INJECTION 76%
75.0000 mL | Freq: Once | INTRAVENOUS | Status: AC | PRN
  Administered 2017-01-24: 75 mL via INTRAVENOUS

## 2017-03-27 DIAGNOSIS — G8929 Other chronic pain: Secondary | ICD-10-CM | POA: Diagnosis not present

## 2017-03-27 DIAGNOSIS — M25512 Pain in left shoulder: Secondary | ICD-10-CM | POA: Diagnosis not present

## 2017-05-19 ENCOUNTER — Other Ambulatory Visit: Payer: Self-pay | Admitting: Internal Medicine

## 2017-05-19 DIAGNOSIS — Z6830 Body mass index (BMI) 30.0-30.9, adult: Secondary | ICD-10-CM | POA: Diagnosis not present

## 2017-05-19 DIAGNOSIS — N6489 Other specified disorders of breast: Secondary | ICD-10-CM | POA: Diagnosis not present

## 2017-05-19 DIAGNOSIS — N6320 Unspecified lump in the left breast, unspecified quadrant: Secondary | ICD-10-CM

## 2017-05-19 DIAGNOSIS — R600 Localized edema: Secondary | ICD-10-CM | POA: Diagnosis not present

## 2017-05-20 ENCOUNTER — Other Ambulatory Visit: Payer: Self-pay | Admitting: Internal Medicine

## 2017-05-20 DIAGNOSIS — N6489 Other specified disorders of breast: Secondary | ICD-10-CM

## 2017-05-27 DIAGNOSIS — I6521 Occlusion and stenosis of right carotid artery: Secondary | ICD-10-CM | POA: Diagnosis not present

## 2017-05-28 ENCOUNTER — Ambulatory Visit
Admission: RE | Admit: 2017-05-28 | Discharge: 2017-05-28 | Disposition: A | Discharge status: Home / Self Care | Payer: Medicare Other | Source: Ambulatory Visit | Attending: Internal Medicine | Admitting: Internal Medicine

## 2017-05-28 ENCOUNTER — Other Ambulatory Visit: Payer: Medicare Other

## 2017-05-28 DIAGNOSIS — R928 Other abnormal and inconclusive findings on diagnostic imaging of breast: Secondary | ICD-10-CM | POA: Diagnosis not present

## 2017-05-28 DIAGNOSIS — N6489 Other specified disorders of breast: Secondary | ICD-10-CM

## 2017-05-28 DIAGNOSIS — N62 Hypertrophy of breast: Secondary | ICD-10-CM | POA: Diagnosis not present

## 2017-06-02 DIAGNOSIS — I6521 Occlusion and stenosis of right carotid artery: Secondary | ICD-10-CM | POA: Diagnosis not present

## 2017-06-02 DIAGNOSIS — I25119 Atherosclerotic heart disease of native coronary artery with unspecified angina pectoris: Secondary | ICD-10-CM | POA: Diagnosis not present

## 2017-06-02 DIAGNOSIS — I10 Essential (primary) hypertension: Secondary | ICD-10-CM | POA: Diagnosis not present

## 2017-06-02 DIAGNOSIS — E119 Type 2 diabetes mellitus without complications: Secondary | ICD-10-CM | POA: Diagnosis not present

## 2017-06-03 DIAGNOSIS — E784 Other hyperlipidemia: Secondary | ICD-10-CM | POA: Diagnosis not present

## 2017-06-03 DIAGNOSIS — E559 Vitamin D deficiency, unspecified: Secondary | ICD-10-CM | POA: Diagnosis not present

## 2017-06-03 DIAGNOSIS — E1140 Type 2 diabetes mellitus with diabetic neuropathy, unspecified: Secondary | ICD-10-CM | POA: Diagnosis not present

## 2017-06-03 DIAGNOSIS — N183 Chronic kidney disease, stage 3 (moderate): Secondary | ICD-10-CM | POA: Diagnosis not present

## 2017-06-03 DIAGNOSIS — Z125 Encounter for screening for malignant neoplasm of prostate: Secondary | ICD-10-CM | POA: Diagnosis not present

## 2017-08-22 DIAGNOSIS — Z6831 Body mass index (BMI) 31.0-31.9, adult: Secondary | ICD-10-CM | POA: Diagnosis not present

## 2017-08-22 DIAGNOSIS — I2510 Atherosclerotic heart disease of native coronary artery without angina pectoris: Secondary | ICD-10-CM | POA: Diagnosis not present

## 2017-08-22 DIAGNOSIS — R600 Localized edema: Secondary | ICD-10-CM | POA: Diagnosis not present

## 2017-08-22 DIAGNOSIS — R0609 Other forms of dyspnea: Secondary | ICD-10-CM | POA: Diagnosis not present

## 2017-08-23 DIAGNOSIS — T161XXA Foreign body in right ear, initial encounter: Secondary | ICD-10-CM | POA: Diagnosis not present

## 2017-09-18 DIAGNOSIS — R0609 Other forms of dyspnea: Secondary | ICD-10-CM | POA: Diagnosis not present

## 2017-09-18 DIAGNOSIS — Z9861 Coronary angioplasty status: Secondary | ICD-10-CM | POA: Diagnosis not present

## 2017-09-18 DIAGNOSIS — R0602 Shortness of breath: Secondary | ICD-10-CM | POA: Diagnosis not present

## 2017-09-24 DIAGNOSIS — Z87898 Personal history of other specified conditions: Secondary | ICD-10-CM | POA: Diagnosis not present

## 2017-09-24 DIAGNOSIS — Z0000 Encounter for general adult medical examination without abnormal findings: Secondary | ICD-10-CM | POA: Diagnosis not present

## 2017-09-24 DIAGNOSIS — Z1389 Encounter for screening for other disorder: Secondary | ICD-10-CM | POA: Diagnosis not present

## 2017-09-24 DIAGNOSIS — I6529 Occlusion and stenosis of unspecified carotid artery: Secondary | ICD-10-CM | POA: Diagnosis not present

## 2017-09-24 DIAGNOSIS — I25118 Atherosclerotic heart disease of native coronary artery with other forms of angina pectoris: Secondary | ICD-10-CM | POA: Diagnosis not present

## 2017-09-24 DIAGNOSIS — I10 Essential (primary) hypertension: Secondary | ICD-10-CM | POA: Diagnosis not present

## 2017-09-24 DIAGNOSIS — Z6830 Body mass index (BMI) 30.0-30.9, adult: Secondary | ICD-10-CM | POA: Diagnosis not present

## 2017-09-24 DIAGNOSIS — R05 Cough: Secondary | ICD-10-CM | POA: Diagnosis not present

## 2017-09-24 DIAGNOSIS — J449 Chronic obstructive pulmonary disease, unspecified: Secondary | ICD-10-CM | POA: Diagnosis not present

## 2017-09-24 DIAGNOSIS — N183 Chronic kidney disease, stage 3 (moderate): Secondary | ICD-10-CM | POA: Diagnosis not present

## 2017-09-24 DIAGNOSIS — E784 Other hyperlipidemia: Secondary | ICD-10-CM | POA: Diagnosis not present

## 2017-09-24 DIAGNOSIS — E1140 Type 2 diabetes mellitus with diabetic neuropathy, unspecified: Secondary | ICD-10-CM | POA: Diagnosis not present

## 2017-09-24 DIAGNOSIS — Z23 Encounter for immunization: Secondary | ICD-10-CM | POA: Diagnosis not present

## 2017-10-02 DIAGNOSIS — R600 Localized edema: Secondary | ICD-10-CM | POA: Diagnosis not present

## 2017-10-02 DIAGNOSIS — Z87898 Personal history of other specified conditions: Secondary | ICD-10-CM | POA: Diagnosis not present

## 2017-10-15 DIAGNOSIS — I25119 Atherosclerotic heart disease of native coronary artery with unspecified angina pectoris: Secondary | ICD-10-CM | POA: Diagnosis not present

## 2017-10-15 DIAGNOSIS — I10 Essential (primary) hypertension: Secondary | ICD-10-CM | POA: Diagnosis not present

## 2017-10-15 DIAGNOSIS — R0602 Shortness of breath: Secondary | ICD-10-CM | POA: Diagnosis not present

## 2017-10-15 DIAGNOSIS — R600 Localized edema: Secondary | ICD-10-CM | POA: Diagnosis not present

## 2017-10-28 DIAGNOSIS — L570 Actinic keratosis: Secondary | ICD-10-CM | POA: Diagnosis not present

## 2017-11-27 DIAGNOSIS — I6523 Occlusion and stenosis of bilateral carotid arteries: Secondary | ICD-10-CM | POA: Diagnosis not present

## 2017-11-28 DIAGNOSIS — Z6831 Body mass index (BMI) 31.0-31.9, adult: Secondary | ICD-10-CM | POA: Diagnosis not present

## 2017-11-28 DIAGNOSIS — J189 Pneumonia, unspecified organism: Secondary | ICD-10-CM | POA: Diagnosis not present

## 2017-11-28 DIAGNOSIS — J329 Chronic sinusitis, unspecified: Secondary | ICD-10-CM | POA: Diagnosis not present

## 2017-11-28 DIAGNOSIS — R05 Cough: Secondary | ICD-10-CM | POA: Diagnosis not present

## 2017-11-28 DIAGNOSIS — J449 Chronic obstructive pulmonary disease, unspecified: Secondary | ICD-10-CM | POA: Diagnosis not present

## 2017-12-01 ENCOUNTER — Other Ambulatory Visit: Payer: Self-pay | Admitting: Internal Medicine

## 2017-12-01 DIAGNOSIS — R05 Cough: Secondary | ICD-10-CM

## 2017-12-01 DIAGNOSIS — R059 Cough, unspecified: Secondary | ICD-10-CM

## 2017-12-18 ENCOUNTER — Ambulatory Visit
Admission: RE | Admit: 2017-12-18 | Discharge: 2017-12-18 | Disposition: A | Discharge status: Home / Self Care | Payer: Medicare Other | Source: Ambulatory Visit | Attending: Internal Medicine | Admitting: Internal Medicine

## 2017-12-18 DIAGNOSIS — R05 Cough: Secondary | ICD-10-CM

## 2017-12-18 DIAGNOSIS — R911 Solitary pulmonary nodule: Secondary | ICD-10-CM | POA: Diagnosis not present

## 2017-12-18 DIAGNOSIS — R059 Cough, unspecified: Secondary | ICD-10-CM

## 2017-12-26 DIAGNOSIS — R0602 Shortness of breath: Secondary | ICD-10-CM | POA: Diagnosis not present

## 2017-12-26 DIAGNOSIS — I25119 Atherosclerotic heart disease of native coronary artery with unspecified angina pectoris: Secondary | ICD-10-CM | POA: Diagnosis not present

## 2017-12-26 DIAGNOSIS — I6521 Occlusion and stenosis of right carotid artery: Secondary | ICD-10-CM | POA: Diagnosis not present

## 2017-12-26 DIAGNOSIS — I10 Essential (primary) hypertension: Secondary | ICD-10-CM | POA: Diagnosis not present

## 2018-01-02 DIAGNOSIS — Z6831 Body mass index (BMI) 31.0-31.9, adult: Secondary | ICD-10-CM | POA: Diagnosis not present

## 2018-01-02 DIAGNOSIS — E1140 Type 2 diabetes mellitus with diabetic neuropathy, unspecified: Secondary | ICD-10-CM | POA: Diagnosis not present

## 2018-01-02 DIAGNOSIS — I25118 Atherosclerotic heart disease of native coronary artery with other forms of angina pectoris: Secondary | ICD-10-CM | POA: Diagnosis not present

## 2018-01-02 DIAGNOSIS — E7849 Other hyperlipidemia: Secondary | ICD-10-CM | POA: Diagnosis not present

## 2018-01-02 DIAGNOSIS — I2510 Atherosclerotic heart disease of native coronary artery without angina pectoris: Secondary | ICD-10-CM | POA: Diagnosis not present

## 2018-01-02 DIAGNOSIS — R918 Other nonspecific abnormal finding of lung field: Secondary | ICD-10-CM | POA: Diagnosis not present

## 2018-01-02 DIAGNOSIS — I10 Essential (primary) hypertension: Secondary | ICD-10-CM | POA: Diagnosis not present

## 2018-01-09 ENCOUNTER — Other Ambulatory Visit (INDEPENDENT_AMBULATORY_CARE_PROVIDER_SITE_OTHER): Payer: Medicare Other

## 2018-01-09 ENCOUNTER — Encounter: Payer: Self-pay | Admitting: Pulmonary Disease

## 2018-01-09 ENCOUNTER — Ambulatory Visit (INDEPENDENT_AMBULATORY_CARE_PROVIDER_SITE_OTHER): Payer: Medicare Other | Admitting: Pulmonary Disease

## 2018-01-09 VITALS — BP 134/64 | HR 79 | Ht 71.0 in | Wt 213.0 lb

## 2018-01-09 DIAGNOSIS — J849 Interstitial pulmonary disease, unspecified: Secondary | ICD-10-CM

## 2018-01-09 LAB — C-REACTIVE PROTEIN: CRP: 0.1 mg/dL — ABNORMAL LOW (ref 0.5–20.0)

## 2018-01-09 LAB — CBC WITH DIFFERENTIAL/PLATELET
BASOS PCT: 1.2 % (ref 0.0–3.0)
Basophils Absolute: 0.1 10*3/uL (ref 0.0–0.1)
EOS ABS: 0.4 10*3/uL (ref 0.0–0.7)
EOS PCT: 4.4 % (ref 0.0–5.0)
HEMATOCRIT: 41.7 % (ref 39.0–52.0)
HEMOGLOBIN: 14.3 g/dL (ref 13.0–17.0)
Lymphocytes Relative: 22.8 % (ref 12.0–46.0)
Lymphs Abs: 2 10*3/uL (ref 0.7–4.0)
MCHC: 34.2 g/dL (ref 30.0–36.0)
MCV: 97.8 fl (ref 78.0–100.0)
Monocytes Absolute: 1 10*3/uL (ref 0.1–1.0)
Monocytes Relative: 11.3 % (ref 3.0–12.0)
NEUTROS ABS: 5.4 10*3/uL (ref 1.4–7.7)
Neutrophils Relative %: 60.3 % (ref 43.0–77.0)
PLATELETS: 244 10*3/uL (ref 150.0–400.0)
RBC: 4.27 Mil/uL (ref 4.22–5.81)
RDW: 13.4 % (ref 11.5–15.5)
WBC: 8.9 10*3/uL (ref 4.0–10.5)

## 2018-01-09 LAB — SEDIMENTATION RATE: Sed Rate: 10 mm/hr (ref 0–20)
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
## 2018-01-09 NOTE — Patient Instructions (Addendum)
We will check CBC differential, blood allergy profile, hypersensitivity profile Check ANA with reflex, CCP, rheumatoid factor, myositis panel, CRP, sed rate Schedule pulmonary function test Follow-up after PFTs.

## 2018-01-12 LAB — RESPIRATORY ALLERGY PROFILE REGION II ~~LOC~~
Allergen, Cedar tree, t12: <0.1 kU/L
Allergen, Mouse Urine Protein, e78: <0.1 kU/L
Allergen, Oak,t7: <0.1 kU/L
Allergen, P. notatum, m1: <0.1 kU/L
Bermuda Grass: <0.1 kU/L
Box Elder IgE: <0.1 kU/L
CLASS: 0
CLASS: 0
CLASS: 0
CLASS: 0
CLASS: 0
CLASS: 0
CLASS: 0
CLASS: 0
CLASS: 0
CLASS: 0
CLASS: 0
CLASS: 0
Cat Dander: 0.12 kU/L — ABNORMAL HIGH
Class: 0
Class: 0
Class: 0
Class: 0
Class: 0
Class: 0
Class: 0
Class: 0
Class: 0
Class: 0
Class: 0
Class: 0
Dog Dander: <0.1 kU/L
Elm IgE: <0.1 kU/L
IgE (Immunoglobulin E), Serum: 74 kU/L (ref <=114)
Johnson Grass: <0.1 kU/L
Pecan/Hickory Tree IgE: <0.1 kU/L
Sheep Sorrel IgE: <0.1 kU/L
Timothy Grass: <0.1 kU/L

## 2018-01-12 LAB — INTERPRETATION:

## 2018-01-16 LAB — HYPERSENSITIVITY PNUEMONITIS PROFILE
ASPERGILLUS FUMIGATUS: NEGATIVE
FAENIA RETIVIRGULA: NEGATIVE
PIGEON SERUM: NEGATIVE
S. VIRIDIS: NEGATIVE
T. CANDIDUS: NEGATIVE
T. VULGARIS: NEGATIVE

## 2018-01-16 LAB — RHEUMATOID FACTOR: Rhuematoid fact SerPl-aCnc: <14 IU/mL (ref <14)

## 2018-01-16 LAB — CYCLIC CITRUL PEPTIDE ANTIBODY, IGG: CYCLIC CITRULLIN PEPTIDE AB: 31 U — AB

## 2018-01-19 ENCOUNTER — Ambulatory Visit (INDEPENDENT_AMBULATORY_CARE_PROVIDER_SITE_OTHER): Payer: Medicare Other | Admitting: Pulmonary Disease

## 2018-01-19 DIAGNOSIS — J849 Interstitial pulmonary disease, unspecified: Secondary | ICD-10-CM | POA: Diagnosis not present

## 2018-01-19 LAB — PULMONARY FUNCTION TEST
DL/VA % pred: 89 %
DL/VA: 4.19 ml/min/mmHg/L
DLCO COR: 28.22 ml/min/mmHg
DLCO UNC % PRED: 79 %
DLCO cor % pred: 82 %
DLCO unc: 27.48 ml/min/mmHg
FEF 25-75 Post: 2.7 L/sec
FEF 25-75 Pre: 1.66 L/sec
FEF2575-%CHANGE-POST: 62 %
FEF2575-%PRED-PRE: 66 %
FEF2575-%Pred-Post: 108 %
FEV1-%Change-Post: 32 %
FEV1-%PRED-PRE: 71 %
FEV1-%Pred-Post: 94 %
FEV1-POST: 3.18 L
FEV1-Pre: 2.39 L
FEV1FVC-%CHANGE-POST: 30 %
FEV1FVC-%PRED-PRE: 79 %
FEV6-%CHANGE-POST: 1 %
FEV6-%PRED-PRE: 94 %
FEV6-%Pred-Post: 96 %
FEV6-Post: 4.15 L
FEV6-Pre: 4.08 L
FEV6FVC-%Change-Post: 0 %
FEV6FVC-%Pred-Post: 105 %
FEV6FVC-%Pred-Pre: 104 %
FVC-%Change-Post: 1 %
FVC-%Pred-Post: 91 %
FVC-%Pred-Pre: 90 %
FVC-Post: 4.2 L
FVC-Pre: 4.14 L
POST FEV6/FVC RATIO: 99 %
Post FEV1/FVC ratio: 76 %
Pre FEV1/FVC ratio: 58 %
Pre FEV6/FVC Ratio: 98 %
RV % pred: 121 %
RV: 3.15 L
TLC % PRED: 99 %
TLC: 7.28 L

## 2018-01-19 NOTE — Progress Notes (Signed)
PFT completed today 01/19/18

## 2018-01-20 LAB — MYOSITIS PANEL III
EJ*: NEGATIVE
JO-1 (WB): NEGATIVE
Ku*: NEGATIVE
MI-2 ANTIBODIES: NEGATIVE
OJ*: NEGATIVE
PL-12*: NEGATIVE
PL-7*: NEGATIVE
PM-SCL 75: NEGATIVE
PM-Scl 100*: NEGATIVE
RNP: 4.5 U/mL
RO-52: NEGATIVE
Signal Recognition Particle*: NEGATIVE

## 2018-01-20 LAB — ANTINUCLEAR ANTIBODIES, IFA: ANA TITER 1: NEGATIVE

## 2018-01-29 ENCOUNTER — Telehealth: Payer: Self-pay | Admitting: Pulmonary Disease

## 2018-01-29 NOTE — Telephone Encounter (Signed)
Called pt and gave him the results of the PFT and told him PM would go over it in more detail with him a his next OV.  Pt expressed understanding. Nothing further needed at this current time.

## 2018-02-02 DIAGNOSIS — M79671 Pain in right foot: Secondary | ICD-10-CM | POA: Diagnosis not present

## 2018-02-02 DIAGNOSIS — S99291A Other physeal fracture of phalanx of right toe, initial encounter for closed fracture: Secondary | ICD-10-CM | POA: Diagnosis not present

## 2018-02-02 DIAGNOSIS — Z6831 Body mass index (BMI) 31.0-31.9, adult: Secondary | ICD-10-CM | POA: Diagnosis not present

## 2018-02-23 ENCOUNTER — Ambulatory Visit: Payer: Medicare Other

## 2018-02-23 ENCOUNTER — Ambulatory Visit (INDEPENDENT_AMBULATORY_CARE_PROVIDER_SITE_OTHER): Payer: Medicare Other | Admitting: Pulmonary Disease

## 2018-02-23 ENCOUNTER — Encounter: Payer: Self-pay | Admitting: Pulmonary Disease

## 2018-02-23 DIAGNOSIS — J849 Interstitial pulmonary disease, unspecified: Secondary | ICD-10-CM

## 2018-02-23 MED ORDER — UMECLIDINIUM-VILANTEROL 62.5-25 MCG/INH IN AEPB: 1.0000 | INHALATION_SPRAY | Freq: Every day | RESPIRATORY_TRACT | 0 refills | Status: AC

## 2018-02-23 MED ORDER — UMECLIDINIUM-VILANTEROL 62.5-25 MCG/INH IN AEPB: 1.0000 | INHALATION_SPRAY | Freq: Every day | RESPIRATORY_TRACT | 3 refills | Status: DC

## 2018-02-23 NOTE — Patient Instructions (Signed)
Your lung function tests show moderate COPD We will start you on anoro inhaler We will get a repeat CT in December 2019 check for evaluation of the interstitial lung disease. Follow-up in 3 months to review response to therapy .
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
## 2018-03-05 IMAGING — CT CT NECK W/ CM
4 of 6 series · 15 of 33 positions shown, 17 images · IV contrast (75CC ISOVUE 300)
Comparison: None.

CLINICAL DATA: Sore throat for 1 month, dysphagia. Stabbing neck
pain. Completed [place]. Possible RIGHT submandibular node.

EXAM:
CT NECK WITH CONTRAST
TECHNIQUE: Multidetector CT imaging of the neck was performed using the
standard protocol following the bolus administration of intravenous
contrast.
CONTRAST:  75mL [id number] IOPAMIDOL ([id number]) INJECTION 61%

[Series 3: axial neck · axial · 0.49mm/px · z∈[+90,+230]mm · 3 of 112 slices shown]
[im 28/112  bone]
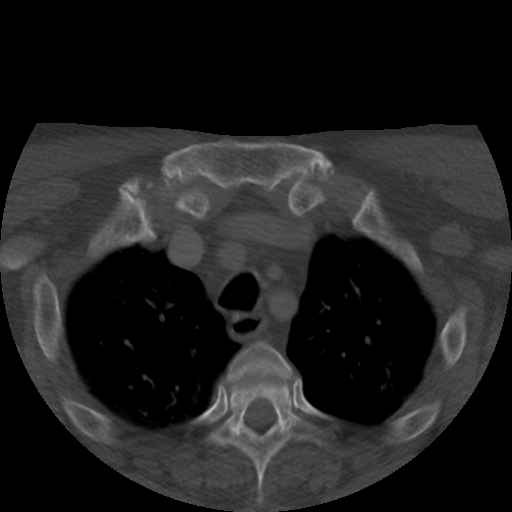
[im 56/112  bone]
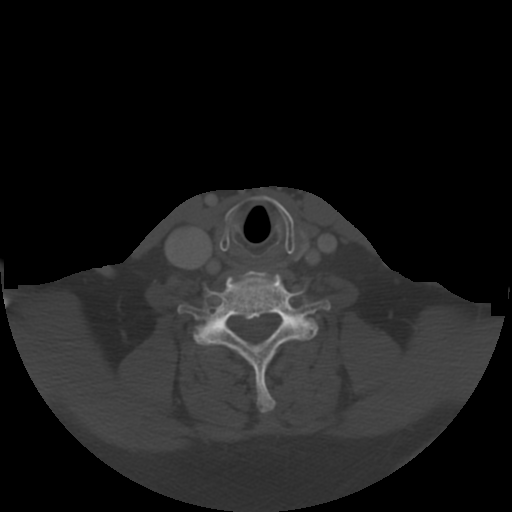
[im 84/112  bone]
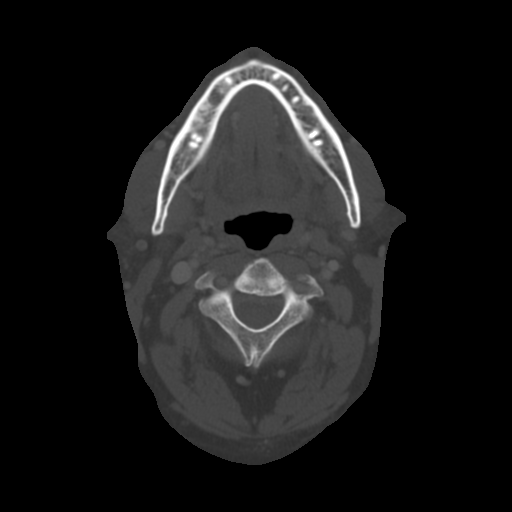

[Series 200: coronal · coronal · 0.56mm/px · 3 of 110 slices shown]
[im 22/110  bone]
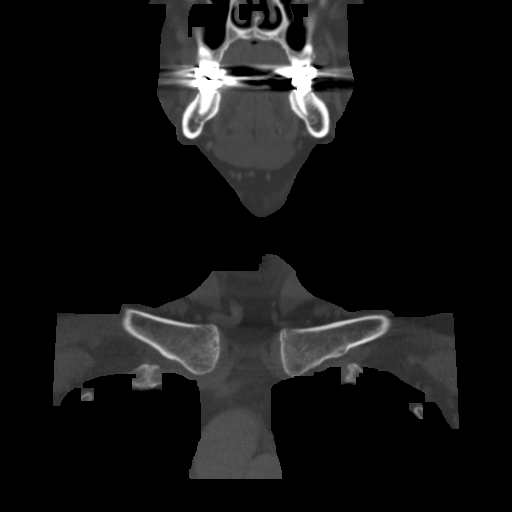
[im 44/110  bone]
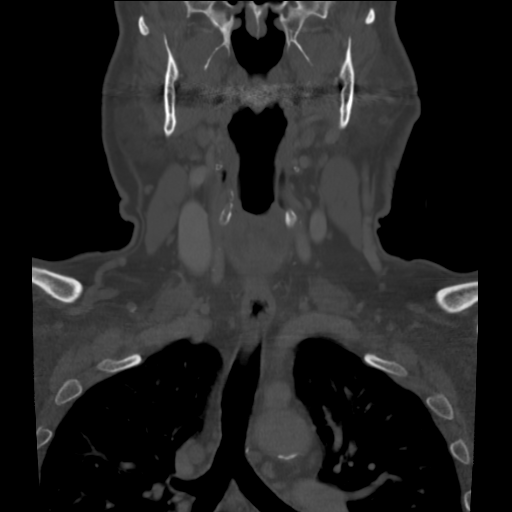
[im 66/110  bone]
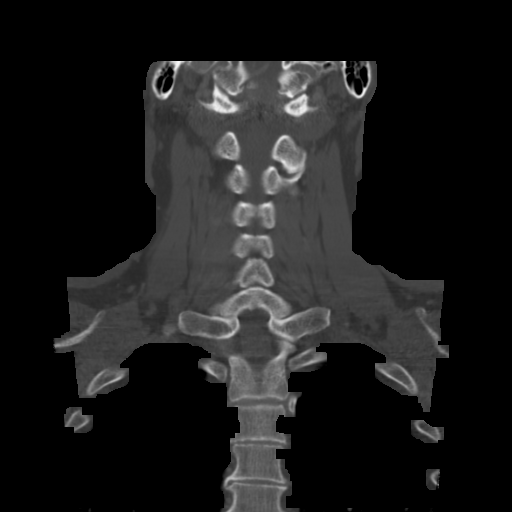

[Series 201: sagittal · sagittal · 0.56mm/px · 5 of 127 slices shown, 6 images]
[im 43/127  bone]
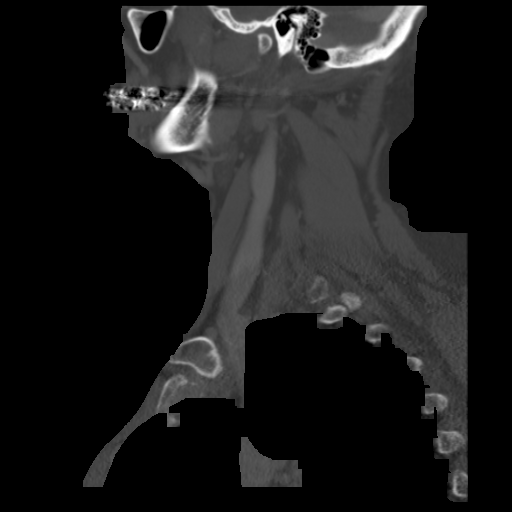
[im 53/127  bone]
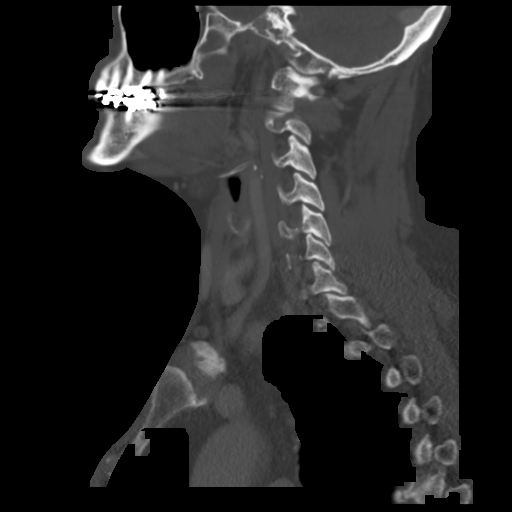
[im 64/127  soft-tissue]
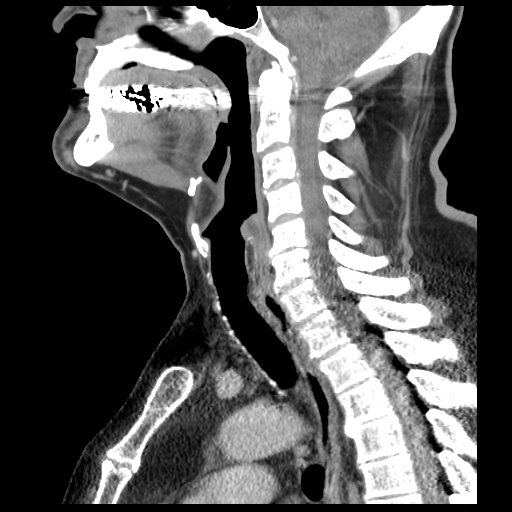
[im 64/127  bone]
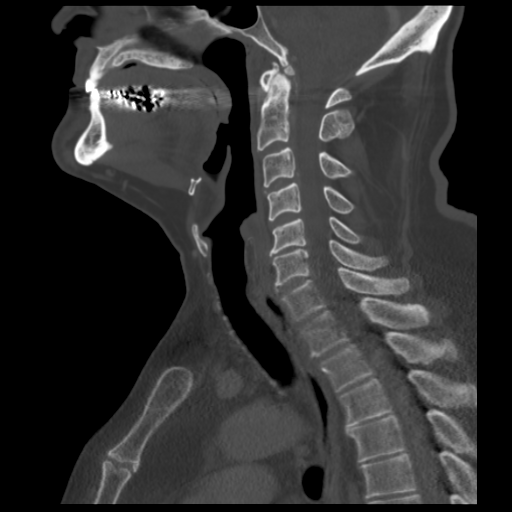
[im 74/127  bone]
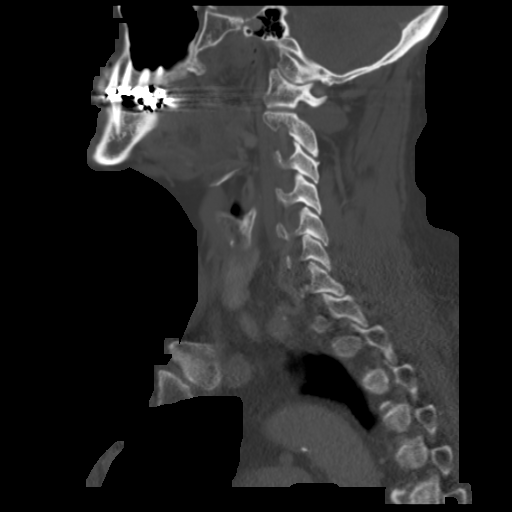
[im 85/127  bone]
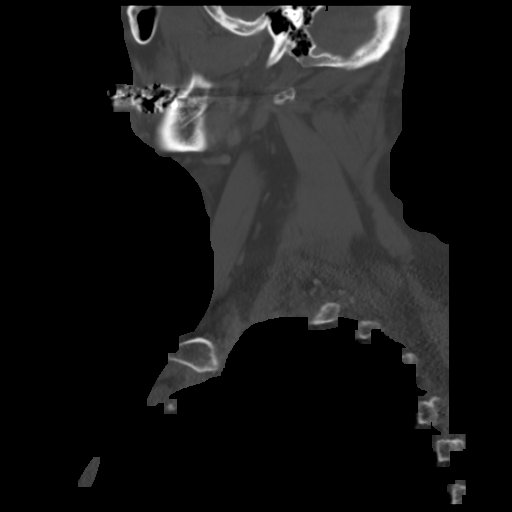

[Series 202: angled to hyoid · axial · 0.56mm/px · z∈[+32,+212]mm · 4 of 157 slices shown, 5 images]
[im 32/157  soft-tissue]
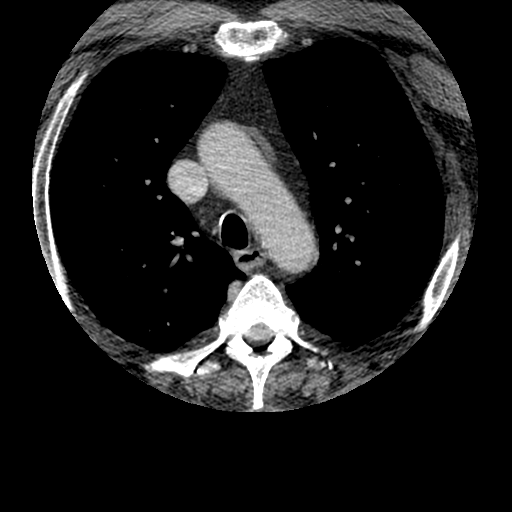
[im 32/157  bone]
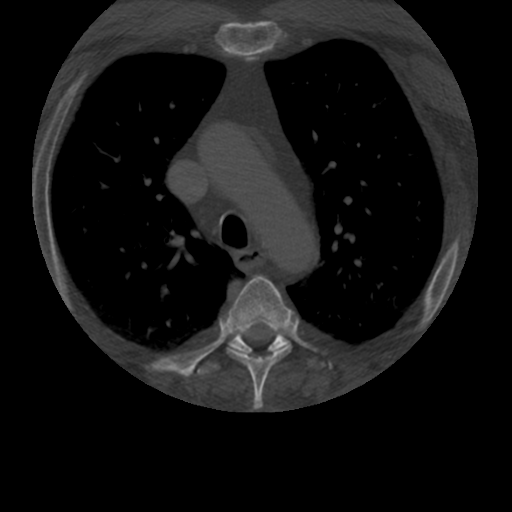
[im 63/157  bone]
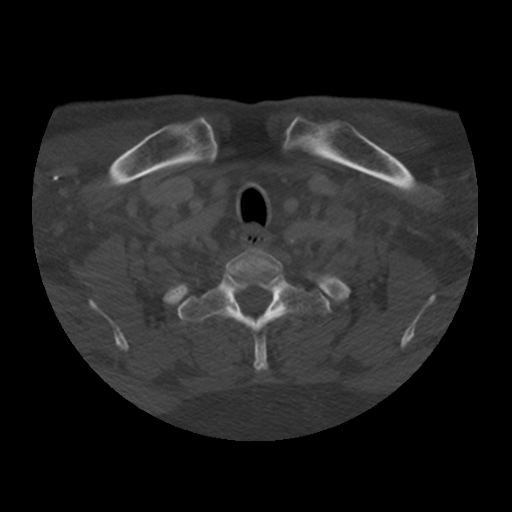
[im 94/157  bone]
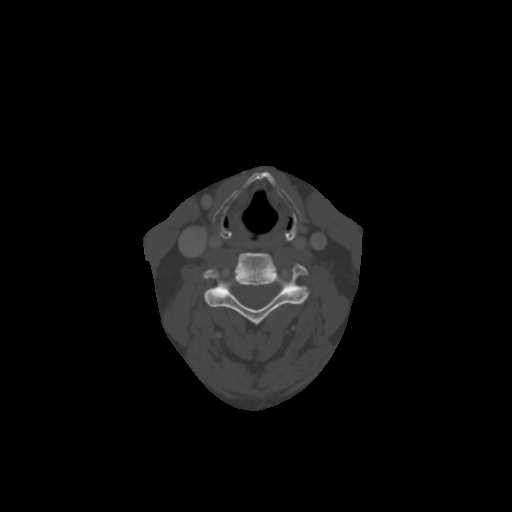
[im 125/157  bone]
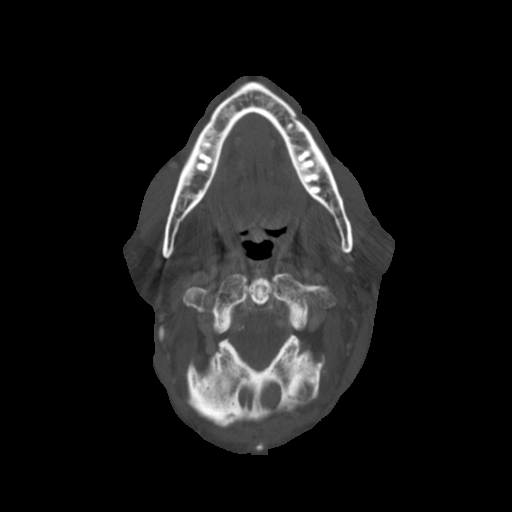

[15 of 33 positions shown; findings below may reference images not displayed]

FINDINGS: Pharynx and larynx: Normal.

Salivary glands: Fatty replaced, nonacute.

Thyroid: Normal.

Lymph nodes: No lymphadenopathy by CT size criteria.

Vascular: Mild calcific atherosclerosis of the aortic arch.
Atherosclerosis of the carotid bifurcations results in suspected
severe stenosis RIGHT internal carotid artery origin, place mild on
the LEFT.

Limited intracranial: Normal.

Visualized orbits: Not imaged.

Mastoids and visualized paranasal sinuses: Well-aerated.

Skeleton: Moderate to severe C5-6 degenerative disc results in mild
neural foraminal narrowing. No acute osseous process or destructive
bony lesions.

Upper chest: Lung apices are clear. No superior mediastinal
lymphadenopathy.

Other: Marker placed over RIGHT anterolateral neck. Normal appearing
subjacent soft tissues. Robust underlying external jugular vein
could represent palpable abnormality. No mass.
IMPRESSION: No acute process in the neck or specific findings to explain
stabbing neck pain.

Atherosclerosis resulting in suspected severe stenosis RIGHT
internal carotid artery origin, this would be better characterized
on CTA NECK on nonemergent basis.

## 2018-03-18 DIAGNOSIS — E119 Type 2 diabetes mellitus without complications: Secondary | ICD-10-CM | POA: Diagnosis not present

## 2018-03-18 DIAGNOSIS — B370 Candidal stomatitis: Secondary | ICD-10-CM | POA: Diagnosis not present

## 2018-03-18 DIAGNOSIS — Z6831 Body mass index (BMI) 31.0-31.9, adult: Secondary | ICD-10-CM | POA: Diagnosis not present

## 2018-03-20 DIAGNOSIS — B370 Candidal stomatitis: Secondary | ICD-10-CM | POA: Diagnosis not present

## 2018-03-20 DIAGNOSIS — Z6831 Body mass index (BMI) 31.0-31.9, adult: Secondary | ICD-10-CM | POA: Diagnosis not present

## 2018-03-20 DIAGNOSIS — K130 Diseases of lips: Secondary | ICD-10-CM | POA: Diagnosis not present

## 2018-04-03 DIAGNOSIS — E7849 Other hyperlipidemia: Secondary | ICD-10-CM | POA: Diagnosis not present

## 2018-04-03 DIAGNOSIS — I10 Essential (primary) hypertension: Secondary | ICD-10-CM | POA: Diagnosis not present

## 2018-04-03 DIAGNOSIS — Z6831 Body mass index (BMI) 31.0-31.9, adult: Secondary | ICD-10-CM | POA: Diagnosis not present

## 2018-04-03 DIAGNOSIS — E119 Type 2 diabetes mellitus without complications: Secondary | ICD-10-CM | POA: Diagnosis not present

## 2018-04-03 DIAGNOSIS — I25118 Atherosclerotic heart disease of native coronary artery with other forms of angina pectoris: Secondary | ICD-10-CM | POA: Diagnosis not present

## 2018-05-18 ENCOUNTER — Encounter: Payer: Self-pay | Admitting: Pulmonary Disease

## 2018-05-18 DIAGNOSIS — I6521 Occlusion and stenosis of right carotid artery: Secondary | ICD-10-CM | POA: Diagnosis not present

## 2018-05-18 DIAGNOSIS — I25119 Atherosclerotic heart disease of native coronary artery with unspecified angina pectoris: Secondary | ICD-10-CM | POA: Diagnosis not present

## 2018-05-18 DIAGNOSIS — I10 Essential (primary) hypertension: Secondary | ICD-10-CM | POA: Diagnosis not present

## 2018-05-18 DIAGNOSIS — R0602 Shortness of breath: Secondary | ICD-10-CM | POA: Diagnosis not present

## 2018-05-26 DIAGNOSIS — I6521 Occlusion and stenosis of right carotid artery: Secondary | ICD-10-CM | POA: Diagnosis not present

## 2018-06-01 ENCOUNTER — Telehealth: Payer: Self-pay

## 2018-06-01 DIAGNOSIS — I6521 Occlusion and stenosis of right carotid artery: Secondary | ICD-10-CM | POA: Diagnosis not present

## 2018-06-01 DIAGNOSIS — J849 Interstitial pulmonary disease, unspecified: Secondary | ICD-10-CM

## 2018-06-01 DIAGNOSIS — I10 Essential (primary) hypertension: Secondary | ICD-10-CM | POA: Diagnosis not present

## 2018-06-01 DIAGNOSIS — I25119 Atherosclerotic heart disease of native coronary artery with unspecified angina pectoris: Secondary | ICD-10-CM | POA: Diagnosis not present

## 2018-06-01 DIAGNOSIS — R0602 Shortness of breath: Secondary | ICD-10-CM | POA: Diagnosis not present

## 2018-06-01 NOTE — Telephone Encounter (Signed)
Per Dr. Vaughan Browner- received call from Dr. Einar Gip regarding pt's increased sob. Schedule pt on 06/16/18 for OV with spiro and DLCO, HR CT and SMW prior. LMTCB x1 for pt.   Will hold message to f/u on

## 2018-06-01 NOTE — Telephone Encounter (Signed)
Pt is returning call. Cb is [phone].

## 2018-06-01 NOTE — Telephone Encounter (Signed)
Spoke with pt and rescheduled his OV , 6 min walk and PFT. For 06/16/2018. The 6 minute walk is on 06/15/18 at 2:30 since we cannot do PDT and SMW on the same day. Nothing further is needed.

## 2018-06-03 DIAGNOSIS — H5203 Hypermetropia, bilateral: Secondary | ICD-10-CM | POA: Diagnosis not present

## 2018-06-03 DIAGNOSIS — H04123 Dry eye syndrome of bilateral lacrimal glands: Secondary | ICD-10-CM | POA: Diagnosis not present

## 2018-06-03 DIAGNOSIS — H01004 Unspecified blepharitis left upper eyelid: Secondary | ICD-10-CM | POA: Diagnosis not present

## 2018-06-03 DIAGNOSIS — E119 Type 2 diabetes mellitus without complications: Secondary | ICD-10-CM | POA: Diagnosis not present

## 2018-06-03 DIAGNOSIS — H52223 Regular astigmatism, bilateral: Secondary | ICD-10-CM | POA: Diagnosis not present

## 2018-06-03 DIAGNOSIS — H01001 Unspecified blepharitis right upper eyelid: Secondary | ICD-10-CM | POA: Diagnosis not present

## 2018-06-03 DIAGNOSIS — H524 Presbyopia: Secondary | ICD-10-CM | POA: Diagnosis not present

## 2018-06-10 ENCOUNTER — Encounter: Payer: Self-pay | Admitting: Podiatry

## 2018-06-10 ENCOUNTER — Ambulatory Visit (INDEPENDENT_AMBULATORY_CARE_PROVIDER_SITE_OTHER): Payer: Medicare Other | Admitting: Podiatry

## 2018-06-10 VITALS — BP 144/73 | HR 55 | Resp 16

## 2018-06-10 DIAGNOSIS — B351 Tinea unguium: Secondary | ICD-10-CM | POA: Diagnosis not present

## 2018-06-10 DIAGNOSIS — L309 Dermatitis, unspecified: Secondary | ICD-10-CM | POA: Diagnosis not present

## 2018-06-10 MED ORDER — TERBINAFINE HCL 250 MG PO TABS: 250.0000 mg | ORAL_TABLET | Freq: Every day | ORAL | 0 refills | Status: DC

## 2018-06-10 NOTE — Progress Notes (Signed)
Subjective:   Patient ID: Miguel Brock, male   DOB: 72 y.o.   MRN: [id number]   HPI Patient presents with discoloration of abnormality of the left plantar forefoot stating he has had it for a while but is gotten worse recently.  Patient does have diabetes and reasonably good control and does not smoke currently and likes to be active   Review of Systems  All other systems reviewed and are negative.       Objective:  Physical Exam  Constitutional: He appears well-developed and well-nourished.  Cardiovascular: Intact distal pulses.  Pulmonary/Chest: Effort normal.  Musculoskeletal: Normal range of motion.  Neurological: He is alert.  Skin: Skin is warm.  Nursing note and vitals reviewed.   Neurovascular status intact muscle strength is adequate range of motion within normal limits with patient found to have abnormal tissue of the plantar aspect of the webspace second third and fourth toes with no proximal edema erythema or drainage noted is localized to this area     Assessment:  Probability for fungal infection plantar aspect left     Plan:  H&P and condition discussed with patient.  At this point I recommended oral Lamisil for 60 days soaks and Lamisil cream and patient will be seen back in several months I also advised if it does not improve but I want him to see a dermatologist.  He was given all instructions for this and is just recently had liver function studies done which were normal

## 2018-06-10 NOTE — Progress Notes (Signed)
   Subjective:    Patient ID: Miguel Brock, male    DOB: [birth year], 72 y.o.   MRN: [id number]  HPI    Review of Systems  All other systems reviewed and are negative.      Objective:   Physical Exam        Assessment & Plan:

## 2018-06-15 ENCOUNTER — Ambulatory Visit (INDEPENDENT_AMBULATORY_CARE_PROVIDER_SITE_OTHER): Payer: Medicare Other | Admitting: *Deleted

## 2018-06-15 DIAGNOSIS — J849 Interstitial pulmonary disease, unspecified: Secondary | ICD-10-CM

## 2018-06-15 NOTE — Progress Notes (Signed)
SIX MIN WALK 06/15/2018  Medications amlodipine 5mg , aspirin 81mg , hydrochlorothiazide 25mg , losartan 100mg , metformin 500mg , ranitidine 150mg , anoro 62.5-25mcg inhaler all taken at 8:30am  Supplimental Oxygen during Test? (L/min) No  Laps 6  Partial Lap (in Meters) 0  Baseline BP (sitting) 122/68  Baseline Heartrate 49  Baseline Dyspnea (Borg Scale) 0.5  Baseline Fatigue (Borg Scale) 0  Baseline SPO2 97  BP (sitting) 130/74  Heartrate 62  Dyspnea (Borg Scale) 0.5  Fatigue (Borg Scale) 0.5  SPO2 96  BP (sitting) 120/68  Heartrate 54  SPO2 98  Stopped or Paused before Six Minutes No  Interpretation Hip pain  Distance Completed 288  Tech Comments: Pt walked at a normal pace denying any complaints during the walk.

## 2018-06-16 ENCOUNTER — Ambulatory Visit: Payer: Medicare Other | Admitting: Pulmonary Disease

## 2018-06-23 ENCOUNTER — Ambulatory Visit: Payer: Medicare Other | Admitting: Pulmonary Disease

## 2018-06-23 ENCOUNTER — Ambulatory Visit: Payer: Medicare Other

## 2018-06-26 ENCOUNTER — Ambulatory Visit: Payer: Medicare Other | Admitting: Pulmonary Disease

## 2018-06-29 ENCOUNTER — Ambulatory Visit (INDEPENDENT_AMBULATORY_CARE_PROVIDER_SITE_OTHER): Payer: Medicare Other | Admitting: Pulmonary Disease

## 2018-06-29 ENCOUNTER — Encounter: Payer: Self-pay | Admitting: Pulmonary Disease

## 2018-06-29 VITALS — BP 118/72 | HR 50 | Ht 71.0 in | Wt 219.5 lb

## 2018-06-29 DIAGNOSIS — J849 Interstitial pulmonary disease, unspecified: Secondary | ICD-10-CM | POA: Diagnosis not present

## 2018-06-29 DIAGNOSIS — R0602 Shortness of breath: Secondary | ICD-10-CM | POA: Diagnosis not present

## 2018-06-29 DIAGNOSIS — Z720 Tobacco use: Secondary | ICD-10-CM | POA: Diagnosis not present

## 2018-06-29 DIAGNOSIS — R0600 Dyspnea, unspecified: Secondary | ICD-10-CM | POA: Insufficient documentation

## 2018-06-29 LAB — PULMONARY FUNCTION TEST
DL/VA % PRED: 82 %
DL/VA: 3.86 ml/min/mmHg/L
DLCO unc % pred: 83 %
DLCO unc: 28.57 ml/min/mmHg
FEF 25-75 PRE: 2.68 L/s
FEF2575-%Pred-Pre: 108 %
FEV1-%PRED-PRE: 98 %
FEV1-PRE: 3.28 L
FEV1FVC-%Pred-Pre: 104 %
FEV6-%Pred-Pre: 99 %
FEV6-Pre: 4.26 L
FEV6FVC-%Pred-Pre: 105 %
FVC-%PRED-PRE: 94 %
FVC-Pre: 4.3 L
Pre FEV1/FVC ratio: 76 %
Pre FEV6/FVC Ratio: 99 %

## 2018-06-29 NOTE — Progress Notes (Signed)
@Patient  ID: Miguel Brock, male    DOB: 1946-05-01, 72 y.o.   MRN: [id number]  Chief Complaint  Patient presents with  . Follow-up    PFT today. Anoro daily. States his breathing has been good.     Referring provider: Velna Hatchet, MD  HPI: 72 year old seen in our office for Dyspnea. Pt of Dr. Vaughan Browner.   Recent [facility] Encounters:   02/23/18 - OV - Mannam 72 year old with history of hypertension, diabetes, GERD, alpha galactosidase deficiency.  He has complaints of cough, dyspnea with activity for the past 1 year.  Seen by primary care in December 2018 for sinusitis and given levofloxacin and Mucinex.  He had a chest x-ray done at primary care office which showed an opacity at the left base.  A follow-up CT scan which showed some reticulation at the bases and a 4 mm lung nodule.  He has been referred to pulmonary for further evaluation. History significant for coronary artery disease.  Follows with Dr. Einar Gip.  Pets: 3 horses, dogs.  No birds, farm animals Occupation:Retired landscaper, worked at [employer]. [employer] veteran Exposures: Exposed to dust, questionable exposure to asbestos.  No exposure to mold Smoking history: 40-pack-year smoking history.  Quit cigarettes in 1983.  Still smokes cigars occasionally. Travel History: Vacation to the [place].  No other significant travel history.  Interim history: Stable dyspnea on exertion.  Denies symptoms at rest.  No cough, sputum, fevers, chills.  Chart Review:   SIX MIN WALK 06/15/2018  Medications amlodipine 5mg , aspirin 81mg , hydrochlorothiazide 25mg , losartan 100mg , metformin 500mg , ranitidine 150mg , anoro 62.5-25mcg inhaler all taken at 8:30am  Supplimental Oxygen during Test? (L/min) No  Laps 6  Partial Lap (in Meters) 0  Baseline BP (sitting) 122/68  Baseline Heartrate 49  Baseline Dyspnea (Borg Scale) 0.5  Baseline Fatigue (Borg Scale) 0  Baseline SPO2 97  BP (sitting) 130/74  Heartrate 62  Dyspnea (Borg  Scale) 0.5  Fatigue (Borg Scale) 0.5  SPO2 96  BP (sitting) 120/68  Heartrate 54  SPO2 98  Stopped or Paused before Six Minutes No  Interpretation Hip pain  Distance Completed 288  Tech Comments: Pt walked at a normal pace denying any complaints during the walk.       06/29/18 Follow up   Pleasant 72 year old patient seen in office today.  Patient completed office spirometry with DLCO.  Results look improved from pulmonary function test that we completed the beginning of this year.  Patient also completed 6-minute walk that he completed without any complications.  Patient reporting that he occasionally has some shortness of breath but this is not an everyday occurrence or even weekly occurrence.  Patient reporting no change in symptoms since he saw Dr. Vaughan Browner.  Allergies  Allergen Reactions  . Lisinopril Cough  . Other Other (See Comments)    Red meat causes occasional tongue swelling, increase in blood pressure (side effect of previous tick bite - alphagal)    Immunization History  Administered Date(s) Administered  . Influenza, High Dose Seasonal PF 10/09/2017  . Pneumococcal-Unspecified 06/07/2013    Past Medical History:  Diagnosis Date  . Alpha galactosidase deficiency   . Anginal pain ([place])    occ  . Bursitis of left hip   . Coronary artery disease 11,15   stents x7  . Depression   . Diabetes mellitus without complication ([place])    TYPE 2  . GERD (gastroesophageal reflux disease)    zantac  . H/O hiatal hernia   .  Hepatitis 70's   food   . Hypertension   . Pneumonia    hx  . PTSD (post-traumatic stress disorder)     Tobacco History: Social History   Tobacco Use  Smoking Status Former Smoker  . Packs/day: 1.50  . Years: 23.00  . Pack years: 34.50  . Types: Cigarettes  . Last attempt to quit: 07/05/1981  . Years since quitting: 37.0  Smokeless Tobacco Never Used  Tobacco Comment   cub[place] cigars once/montly   Counseling given: Yes Comment: [place]  cigars once/montly Patient still smokes [place] cigars occasionally.  Discussed with patient that we do not recommend that you smoke any sort of tobacco product.  Patient is not interested in stopping.  Outpatient Encounter Medications as of 06/29/2018  Medication Sig  . amLODipine (NORVASC) 5 MG tablet Take 5 mg by mouth daily.  Marland Kitchen aspirin EC 81 MG tablet Take 81 mg by mouth daily.  Marland Kitchen atorvastatin (LIPITOR) 80 MG tablet Take 40 mg by mouth daily after supper.   . cyclobenzaprine (FLEXERIL) 10 MG tablet Take 10 mg by mouth daily as needed for muscle spasms.   . diphenhydrAMINE (BENADRYL) 25 MG tablet Take 25 mg by mouth every 6 (six) hours as needed for allergies.  Marland Kitchen EPINEPHrine (EPIPEN) 0.3 mg/0.3 mL SOAJ Inject 0.3 mLs (0.3 mg total) into the muscle once.  . hydrochlorothiazide (HYDRODIURIL) 25 MG tablet Take 1 tablet (25 mg total) by mouth every morning. (Patient taking differently: Take 25 mg by mouth daily. )  . losartan (COZAAR) 100 MG tablet Take 100 mg by mouth daily.   . metFORMIN (GLUCOPHAGE) 500 MG tablet Take 1 tablet (500 mg total) by mouth 2 (two) times daily with a meal.  . methocarbamol (ROBAXIN) 500 MG tablet Take 1 tablet (500 mg total) by mouth every 6 (six) hours as needed for muscle spasms.  . metoprolol succinate (TOPROL-XL) 100 MG 24 hr tablet Take 100 mg by mouth daily with breakfast. Take with or immediately following a meal.  . naproxen sodium (ALEVE) 220 MG tablet Take 440 mg by mouth at bedtime as needed (pain).  . nitroGLYCERIN (NITROSTAT) 0.4 MG SL tablet Place 0.4 mg under the tongue every 5 (five) minutes as needed for chest pain.  . ranitidine (ZANTAC) 150 MG tablet Take 150 mg by mouth 2 (two) times daily.  Marland Kitchen terbinafine (LAMISIL) 250 MG tablet Take 1 tablet (250 mg total) by mouth daily.  . traMADol (ULTRAM) 50 MG tablet Take 50 mg by mouth daily as needed (pain).   Marland Kitchen umeclidinium-vilanterol (ANORO ELLIPTA) 62.5-25 MCG/INH AEPB Inhale 1 puff into the lungs daily.    No facility-administered encounter medications on file as of 06/29/2018.      Review of Systems  Constitutional:   No  weight loss, night sweats,  fevers, chills, fatigue, or  lassitude HEENT:   No headaches,  Difficulty swallowing,  Tooth/dental problems, or  Sore throat, No sneezing, itching, ear ache, nasal congestion, post nasal drip  CV: No chest pain,  orthopnea, PND, swelling in lower extremities, anasarca, dizziness, palpitations, syncope  GI: No heartburn, indigestion, abdominal pain, nausea, vomiting, diarrhea, change in bowel habits, loss of appetite, bloody stools Resp: +occasional sob with exertion but rare No shortness of breath at rest.  No excess mucus, no productive cough,  No non-productive cough,  No coughing up of blood.  No change in color of mucus.  No wheezing.  No chest wall deformity Skin: no rash, lesions, no skin changes. GU: no  dysuria, change in color of urine, no urgency or frequency.  No flank pain, no hematuria  MS:  No joint pain or swelling.  No decreased range of motion.  No back pain. Psych:  No change in mood or affect. No depression or anxiety.  No memory loss.   Physical Exam  BP 118/72   Pulse (!) 50   Ht 5\' 11"  (1.803 m)   Wt 219 lb 8 oz (99.6 kg)   SpO2 97%   BMI 30.61 kg/m   Wt Readings from Last 3 Encounters:  06/29/18 219 lb 8 oz (99.6 kg)  02/23/18 219 lb (99.3 kg)  01/09/18 213 lb (96.6 kg)    GEN: A/Ox3; pleasant , NAD, well nourished    HEENT:  [place]/AT,  EACs-clear, TMs-wnl, NOSE-clear, THROAT-clear, no lesions, no postnasal drip or exudate noted.   NECK:  Supple w/ fair ROM; no JVD;  no lymphadenopathy.    RESP:  Clear  P & A; w/o, wheezes/ rales/ or rhonchi. no accessory muscle use, no dullness to percussion  CARD:  RRR, no m/r/g, no peripheral edema, pulses intact, no cyanosis or clubbing.  Musco: Warm bil, no deformities or joint swelling noted.   Neuro: alert, no focal deficits noted.    Skin: Warm, no lesions or  rashes    Lab Results:  CBC    Component Value Date/Time   WBC 8.9 01/09/2018 1653   RBC 4.27 01/09/2018 1653   HGB 14.3 01/09/2018 1653   HCT 41.7 01/09/2018 1653   PLT 244.0 01/09/2018 1653   MCV 97.8 01/09/2018 1653   MCH 33.8 11/04/2016 1530   MCHC 34.2 01/09/2018 1653   RDW 13.4 01/09/2018 1653   LYMPHSABS 2.0 01/09/2018 1653   MONOABS 1.0 01/09/2018 1653   EOSABS 0.4 01/09/2018 1653   BASOSABS 0.1 01/09/2018 1653    BMET    Component Value Date/Time   NA 139 11/04/2016 1648   K 3.7 11/04/2016 1648   CL 104 11/04/2016 1541   CO2 20 (L) 11/04/2016 1541   GLUCOSE 103 (H) 11/04/2016 1648   BUN 23 (H) 11/04/2016 1541   CREATININE 1.45 (H) 11/04/2016 1541   CALCIUM 9.6 11/04/2016 1541   GFRNONAA 47 (L) 11/04/2016 1541   GFRAA 54 (L) 11/04/2016 1541    BNP No results found for: BNP  ProBNP No results found for: PROBNP  Imaging: No results found.   Assessment & Plan:   Pleasant patient seen in office today.  Reviewed results of spirometry and DLCO with patient.  Continue Anoro.  Follow-up with Dr. Vaughan Browner in 6 months with completion of repeat CT high-res in December/2019.  Encourage patient to stop smoking  If symptoms worsen, respiratory status worsens, or have any concerns regarding your breathing or inhaler use please follow-up with our office before then.  Tobacco use Follow-up with Dr. Vaughan Browner in 6 months Complete CT high-res in 5 months >>> December/2019  Follow-up with our office sooner if you have any concerns regarding your breathing, respiratory status, inhalers.  Continue your Anoro inhaler  We do recommend that you stop smoking.   Shortness of breath Follow-up with Dr. Vaughan Browner in 6 months Complete CT high-res in 5 months >>> December/2019  Follow-up with our office sooner if you have any concerns regarding your breathing, respiratory status, inhalers.  Continue your Anoro inhaler  We do recommend that you stop  smoking.      Lauraine Rinne, NP 06/29/2018

## 2018-06-29 NOTE — Assessment & Plan Note (Signed)
Follow-up with Dr. Vaughan Browner in 6 months Complete CT high-res in 5 months >>> December/2019  Follow-up with our office sooner if you have any concerns regarding your breathing, respiratory status, inhalers.  Continue your Anoro inhaler  We do recommend that you stop smoking.

## 2018-06-29 NOTE — Progress Notes (Signed)
Pt completed DLCO and [place] only.

## 2018-06-29 NOTE — Patient Instructions (Signed)
Follow-up with Dr. Vaughan Browner in 6 months Complete CT high-res in 5 months >>> December/2019  Follow-up with our office sooner if you have any concerns regarding your breathing, respiratory status, inhalers.  Continue your Anoro inhaler  We do recommend that you stop smoking.     Please contact the office if your symptoms worsen or you have concerns that you are not improving.   Thank you for choosing [facility] for your healthcare, and for allowing [place] to partner with you on your healthcare journey. I am thankful to be able to provide care to you today.   Wyn Quaker FNP-C

## 2018-06-30 NOTE — Addendum Note (Signed)
Addended by: Vivia Ewing on: 06/30/2018 09:17 AM   Modules accepted: Orders

## 2018-07-13 DIAGNOSIS — L570 Actinic keratosis: Secondary | ICD-10-CM | POA: Diagnosis not present

## 2018-07-13 DIAGNOSIS — B353 Tinea pedis: Secondary | ICD-10-CM | POA: Diagnosis not present

## 2018-07-13 DIAGNOSIS — D225 Melanocytic nevi of trunk: Secondary | ICD-10-CM | POA: Diagnosis not present

## 2018-07-13 DIAGNOSIS — L821 Other seborrheic keratosis: Secondary | ICD-10-CM | POA: Diagnosis not present

## 2018-07-21 DIAGNOSIS — Z0189 Encounter for other specified special examinations: Secondary | ICD-10-CM | POA: Diagnosis not present

## 2018-07-21 DIAGNOSIS — I10 Essential (primary) hypertension: Secondary | ICD-10-CM | POA: Diagnosis not present

## 2018-07-21 DIAGNOSIS — R12 Heartburn: Secondary | ICD-10-CM | POA: Diagnosis not present

## 2018-07-21 DIAGNOSIS — I25119 Atherosclerotic heart disease of native coronary artery with unspecified angina pectoris: Secondary | ICD-10-CM | POA: Diagnosis not present

## 2018-07-24 ENCOUNTER — Encounter: Payer: Self-pay | Admitting: Gastroenterology

## 2018-07-24 ENCOUNTER — Ambulatory Visit (INDEPENDENT_AMBULATORY_CARE_PROVIDER_SITE_OTHER): Payer: Medicare Other | Admitting: Gastroenterology

## 2018-07-24 VITALS — BP 142/72 | HR 74 | Ht 71.0 in | Wt 216.0 lb

## 2018-07-24 DIAGNOSIS — K626 Ulcer of anus and rectum: Secondary | ICD-10-CM | POA: Diagnosis not present

## 2018-07-24 DIAGNOSIS — K219 Gastro-esophageal reflux disease without esophagitis: Secondary | ICD-10-CM

## 2018-07-24 DIAGNOSIS — R1310 Dysphagia, unspecified: Secondary | ICD-10-CM | POA: Diagnosis not present

## 2018-07-24 DIAGNOSIS — K648 Other hemorrhoids: Secondary | ICD-10-CM

## 2018-07-24 MED ORDER — PEG-KCL-NACL-NASULF-NA ASC-C 140 G PO SOLR: 1.0000 | Freq: Once | ORAL | 0 refills | Status: AC

## 2018-07-24 NOTE — Patient Instructions (Signed)
You have been scheduled for an endoscopy and colonoscopy. Please follow the written instructions given to you at your visit today. Please pick up your prep supplies at the pharmacy within the next 1-3 days. If you use inhalers (even only as needed), please bring them with you on the day of your procedure. Your physician has requested that you go to www.startemmi.com and enter the access code given to you at your visit today. This web site gives a general overview about your procedure. However, you should still follow specific instructions given to you by our office regarding your preparation for the procedure.  Take Benefiber 1 tablespoon 2-3 times daily  Stay on Protonix daily 30 minutes before breakfast  Continue Zantac at bedtime   Gastroesophageal Reflux Disease, Adult Normally, food travels down the esophagus and stays in the stomach to be digested. However, when a person has gastroesophageal reflux disease (GERD), food and stomach acid move back up into the esophagus. When this happens, the esophagus becomes sore and inflamed. Over time, GERD can create small holes (ulcers) in the lining of the esophagus. What are the causes? This condition is caused by a problem with the muscle between the esophagus and the stomach (lower esophageal sphincter, or LES). Normally, the LES muscle closes after food passes through the esophagus to the stomach. When the LES is weakened or abnormal, it does not close properly, and that allows food and stomach acid to go back up into the esophagus. The LES can be weakened by certain dietary substances, medicines, and medical conditions, including:  Tobacco use.  Pregnancy.  Having a hiatal hernia.  Heavy alcohol use.  Certain foods and beverages, such as coffee, chocolate, onions, and peppermint.  What increases the risk? This condition is more likely to develop in:  People who have an increased body weight.  People who have connective tissue  disorders.  People who use NSAID medicines.  What are the signs or symptoms? Symptoms of this condition include:  Heartburn.  Difficult or painful swallowing.  The feeling of having a lump in the throat.  Abitter taste in the mouth.  Bad breath.  Having a large amount of saliva.  Having an upset or bloated stomach.  Belching.  Chest pain.  Shortness of breath or wheezing.  Ongoing (chronic) cough or a night-time cough.  Wearing away of tooth enamel.  Weight loss.  Different conditions can cause chest pain. Make sure to see your health care provider if you experience chest pain. How is this diagnosed? Your health care provider will take a medical history and perform a physical exam. To determine if you have mild or severe GERD, your health care provider may also monitor how you respond to treatment. You may also have other tests, including:  An endoscopy toexamine your stomach and esophagus with a small camera.  A test thatmeasures the acidity level in your esophagus.  A test thatmeasures how much pressure is on your esophagus.  A barium swallow or modified barium swallow to show the shape, size, and functioning of your esophagus.  How is this treated? The goal of treatment is to help relieve your symptoms and to prevent complications. Treatment for this condition may vary depending on how severe your symptoms are. Your health care provider may recommend:  Changes to your diet.  Medicine.  Surgery.  Follow these instructions at home: Diet  Follow a diet as recommended by your health care provider. This may involve avoiding foods and drinks such as: ?  Coffee and tea (with or without caffeine). ? Drinks that containalcohol. ? Energy drinks and sports drinks. ? Carbonated drinks or sodas. ? Chocolate and cocoa. ? Peppermint and mint flavorings. ? Garlic and onions. ? Horseradish. ? Spicy and acidic foods, including peppers, chili powder, curry  powder, vinegar, hot sauces, and barbecue sauce. ? Citrus fruit juices and citrus fruits, such as oranges, lemons, and limes. ? Tomato-based foods, such as red sauce, chili, salsa, and pizza with red sauce. ? Fried and fatty foods, such as donuts, french fries, potato chips, and high-fat dressings. ? High-fat meats, such as hot dogs and fatty cuts of red and white meats, such as rib eye steak, sausage, ham, and bacon. ? High-fat dairy items, such as whole milk, butter, and cream cheese.  Eat small, frequent meals instead of large meals.  Avoid drinking large amounts of liquid with your meals.  Avoid eating meals during the 2-3 hours before bedtime.  Avoid lying down right after you eat.  Do not exercise right after you eat. General instructions  Pay attention to any changes in your symptoms.  Take over-the-counter and prescription medicines only as told by your health care provider. Do not take aspirin, ibuprofen, or other NSAIDs unless your health care provider told you to do so.  Do not use any tobacco products, including cigarettes, chewing tobacco, and e-cigarettes. If you need help quitting, ask your health care provider.  Wear loose-fitting clothing. Do not wear anything tight around your waist that causes pressure on your abdomen.  Raise (elevate) the head of your bed 6 inches (15cm).  Try to reduce your stress, such as with yoga or meditation. If you need help reducing stress, ask your health care provider.  If you are overweight, reduce your weight to an amount that is healthy for you. Ask your health care provider for guidance about a safe weight loss goal.  Keep all follow-up visits as told by your health care provider. This is important. Contact a health care provider if:  You have new symptoms.  You have unexplained weight loss.  You have difficulty swallowing, or it hurts to swallow.  You have wheezing or a persistent cough.  Your symptoms do not improve with  treatment.  You have a hoarse voice. Get help right away if:  You have pain in your arms, neck, jaw, teeth, or back.  You feel sweaty, dizzy, or light-headed.  You have chest pain or shortness of breath.  You vomit and your vomit looks like blood or coffee grounds.  You faint.  Your stool is bloody or black.  You cannot swallow, drink, or eat. This information is not intended to replace advice given to you by your health care provider. Make sure you discuss any questions you have with your health care provider. Document Released: 09/25/2005 Document Revised: 05/15/2016 Document Reviewed: 04/12/2015 Elsevier Interactive Patient Education  [employer].
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
## 2018-07-27 ENCOUNTER — Encounter: Payer: Self-pay | Admitting: Gastroenterology

## 2018-07-28 DIAGNOSIS — I10 Essential (primary) hypertension: Secondary | ICD-10-CM | POA: Diagnosis not present

## 2018-07-28 DIAGNOSIS — L237 Allergic contact dermatitis due to plants, except food: Secondary | ICD-10-CM | POA: Diagnosis not present

## 2018-07-28 DIAGNOSIS — E119 Type 2 diabetes mellitus without complications: Secondary | ICD-10-CM | POA: Diagnosis not present

## 2018-08-10 DIAGNOSIS — M533 Sacrococcygeal disorders, not elsewhere classified: Secondary | ICD-10-CM | POA: Diagnosis not present

## 2018-08-13 DIAGNOSIS — L570 Actinic keratosis: Secondary | ICD-10-CM | POA: Diagnosis not present

## 2018-08-13 DIAGNOSIS — B353 Tinea pedis: Secondary | ICD-10-CM | POA: Diagnosis not present

## 2018-08-18 DIAGNOSIS — N5201 Erectile dysfunction due to arterial insufficiency: Secondary | ICD-10-CM | POA: Diagnosis not present

## 2018-08-20 ENCOUNTER — Encounter: Payer: Medicare Other | Admitting: Gastroenterology

## 2018-08-20 ENCOUNTER — Ambulatory Visit (AMBULATORY_SURGERY_CENTER): Payer: Medicare Other | Admitting: Gastroenterology

## 2018-08-20 ENCOUNTER — Encounter: Payer: Self-pay | Admitting: Gastroenterology

## 2018-08-20 VITALS — BP 152/55 | HR 50 | Temp 98.4°F | Resp 9 | Ht 71.0 in | Wt 216.0 lb

## 2018-08-20 DIAGNOSIS — R1310 Dysphagia, unspecified: Secondary | ICD-10-CM

## 2018-08-20 DIAGNOSIS — K626 Ulcer of anus and rectum: Secondary | ICD-10-CM | POA: Diagnosis not present

## 2018-08-20 DIAGNOSIS — Z1211 Encounter for screening for malignant neoplasm of colon: Secondary | ICD-10-CM

## 2018-08-20 DIAGNOSIS — K222 Esophageal obstruction: Secondary | ICD-10-CM | POA: Diagnosis not present

## 2018-08-20 DIAGNOSIS — D123 Benign neoplasm of transverse colon: Secondary | ICD-10-CM | POA: Diagnosis not present

## 2018-08-20 DIAGNOSIS — K2971 Gastritis, unspecified, with bleeding: Secondary | ICD-10-CM | POA: Diagnosis not present

## 2018-08-20 DIAGNOSIS — K2950 Unspecified chronic gastritis without bleeding: Secondary | ICD-10-CM | POA: Diagnosis not present

## 2018-08-20 DIAGNOSIS — D124 Benign neoplasm of descending colon: Secondary | ICD-10-CM

## 2018-08-20 DIAGNOSIS — K228 Other specified diseases of esophagus: Secondary | ICD-10-CM | POA: Diagnosis not present

## 2018-08-20 MED ORDER — SODIUM CHLORIDE 0.9 % IV SOLN: 500.0000 mL | Freq: Once | INTRAVENOUS | Status: DC

## 2018-08-20 NOTE — Progress Notes (Signed)
Patient arrived in recovery c/o abdominal cramping. He was firm and distended. Gave him 0.25 mg of Levsin Po and 1.2 ml of Simethicone per Dr. Silverio Decamp. Patient began passing gas and burping after medication given and repositioning plus ambulating. Patient still distended but passing gas. Dr. Silverio Decamp at bedside and instructed patient to call with any increased pain or inability to pass gas. Pt and wife verbalized understanding. SM

## 2018-08-20 NOTE — Op Note (Signed)
Miguel Brock Patient Name: Miguel Brock Procedure Date: 08/20/2018 1:50 PM MRN: [id number] Endoscopist: Mauri Pole , MD Age: 72 Referring MD:  Date of Birth: 1946/07/10 Gender: Male Account #: [id number] Procedure:                Colonoscopy Indications:              Screening for colorectal malignant neoplasm Medicines:                Monitored Anesthesia Care Procedure:                Pre-Anesthesia Assessment:                           - Prior to the procedure, a History and Physical                            was performed, and patient medications and                            allergies were reviewed. The patient's tolerance of                            previous anesthesia was also reviewed. The risks                            and benefits of the procedure and the sedation                            options and risks were discussed with the patient.                            All questions were answered, and informed consent                            was obtained. Prior Anticoagulants: The patient has                            taken no previous anticoagulant or antiplatelet                            agents. ASA Grade Assessment: III - A patient with                            severe systemic disease. After reviewing the risks                            and benefits, the patient was deemed in                            satisfactory condition to undergo the procedure.                           After obtaining informed consent, the colonoscope  was passed under direct vision. Throughout the                            procedure, the patient's blood pressure, pulse, and                            oxygen saturations were monitored continuously. The                            Model CF-HQ190L [id number]) scope was introduced                            through the anus and advanced to the the cecum,                            identified by  appendiceal orifice and ileocecal                            valve. The colonoscopy was technically difficult                            and complex due to restricted mobility of the                            colon. Successful completion of the procedure was                            aided by withdrawing the scope and replacing with                            the adult endoscope. The patient tolerated the                            procedure well. The quality of the bowel                            preparation was excellent. The ileocecal valve,                            appendiceal orifice, and rectum were photographed. Scope In: 2:19:12 PM Scope Out: 2:58:20 PM Scope Withdrawal Time: 0 hours 10 minutes 35 seconds  Total Procedure Duration: 0 hours 39 minutes 8 seconds  Findings:                 The perianal and digital rectal examinations were                            normal.                           Two sessile polyps were found in the descending                            colon and transverse colon. The polyps were 5 to 7  mm in size. These polyps were removed with a cold                            snare. Resection and retrieval were complete.                           Two sessile polyps were found in the transverse                            colon. The polyps were 1 to 2 mm in size. These                            polyps were removed with a cold biopsy forceps.                            Resection and retrieval were complete.                           Multiple small and large-mouthed diverticula were                            found in the sigmoid colon.                           Non-bleeding internal hemorrhoids were found during                            retroflexion. The hemorrhoids were medium-sized. Complications:            No immediate complications. Estimated Blood Loss:     Estimated blood loss was minimal. Impression:               - Two 5  to 7 mm polyps in the descending colon and                            in the transverse colon, removed with a cold snare.                            Resected and retrieved.                           - Two 1 to 2 mm polyps in the transverse colon,                            removed with a cold biopsy forceps. Resected and                            retrieved.                           - Diverticulosis in the sigmoid colon.                           - Non-bleeding internal hemorrhoids. Recommendation:           -  Patient has a contact number available for                            emergencies. The signs and symptoms of potential                            delayed complications were discussed with the                            patient. Return to normal activities tomorrow.                            Written discharge instructions were provided to the                            patient.                           - Resume previous diet.                           - Continue present medications.                           - Await pathology results.                           - Repeat colonoscopy date to be determined after                            pending pathology results are reviewed for                            surveillance based on pathology results.                           - Return to GI clinic at appointment to be                            scheduled for hemorrhoidal band ligation. Mauri Pole, MD 08/20/2018 3:08:22 PM This report has been signed electronically.

## 2018-08-20 NOTE — Progress Notes (Signed)
Called to room to assist during endoscopic procedure.  Patient ID and intended procedure confirmed with present staff. Received instructions for my participation in the procedure from the performing physician.  

## 2018-08-20 NOTE — Op Note (Addendum)
[facility] Patient Name: Miguel Brock Procedure Date: 08/20/2018 1:50 PM MRN: [id number] Endoscopist: Mauri Pole , MD Age: 72 Referring MD:  Date of Birth: 02/22/1946 Gender: Male Account #: [id number] Procedure:                Upper GI endoscopy Indications:              Dysphagia Medicines:                Monitored Anesthesia Care Procedure:                Pre-Anesthesia Assessment:                           - Prior to the procedure, a History and Physical                            was performed, and patient medications and                            allergies were reviewed. The patient's tolerance of                            previous anesthesia was also reviewed. The risks                            and benefits of the procedure and the sedation                            options and risks were discussed with the patient.                            All questions were answered, and informed consent                            was obtained. Prior Anticoagulants: The patient has                            taken no previous anticoagulant or antiplatelet                            agents. ASA Grade Assessment: II - A patient with                            mild systemic disease. After reviewing the risks                            and benefits, the patient was deemed in                            satisfactory condition to undergo the procedure.                           After obtaining informed consent, the endoscope was  passed under direct vision. Throughout the                            procedure, the patient's blood pressure, pulse, and                            oxygen saturations were monitored continuously. The                            Model GIF-HQ190 [id number]) scope was introduced                            through the mouth, and advanced to the second part                            of duodenum. The upper GI endoscopy was                             accomplished without difficulty. The patient                            tolerated the procedure well. Scope In: Scope Out: Findings:                 The Z-line was regular and was found 38 cm from the                            incisors.                           One benign-appearing, intrinsic mild                            (non-circumferential scarring) stenosis was found                            36 to 37 cm from the incisors. This stenosis                            measured 1.7 cm (inner diameter) x less than one cm                            (in length). The stenosis was traversed. A TTS                            dilator was passed through the scope. Dilation with                            an 18-19-20 mm balloon dilator was performed to 20                            mm. The dilation site was examined following  endoscope reinsertion and showed mild mucosal                            disruption. Biopsies were obtained from the                            proximal and distal esophagus with cold forceps for                            histology of suspected eosinophilic esophagitis.                           A 4 cm hiatal hernia was present.                           Patchy moderate inflammation characterized by                            congestion (edema) and erythema was found in the                            gastric antrum. Biopsies were taken with a cold                            forceps for Helicobacter pylori testing.                           The examined duodenum was normal. Complications:            No immediate complications. Estimated Blood Loss:     Estimated blood loss was minimal. Impression:               - Z-line regular, 38 cm from the incisors.                           - Benign-appearing esophageal stenosis. Dilated.                            Biopsied.                           - 4 cm hiatal hernia.                            - Gastritis. Biopsied.                           - Normal examined duodenum. Recommendation:           - Patient has a contact number available for                            emergencies. The signs and symptoms of potential                            delayed complications were discussed with the  patient. Return to normal activities tomorrow.                            Written discharge instructions were provided to the                            patient.                           - Continue Ranitidine 150mg  BID                           - See the other procedure note for documentation of                            additional recommendations. Mauri Pole, MD 08/20/2018 3:03:12 PM This report has been signed electronically.

## 2018-08-20 NOTE — Progress Notes (Signed)
History reviewed today 

## 2018-08-20 NOTE — Progress Notes (Signed)
A/ox3 pleased with MAC, report to RN 

## 2018-08-20 NOTE — Patient Instructions (Addendum)
YOU HAD AN ENDOSCOPIC PROCEDURE TODAY AT [facility]:   Refer to the procedure report that was given to you for any specific questions about what was found during the examination.  If the procedure report does not answer your questions, please call your gastroenterologist to clarify.  If you requested that your care partner not be given the details of your procedure findings, then the procedure report has been included in a sealed envelope for you to review at your convenience later.  YOU SHOULD EXPECT: Some feelings of bloating in the abdomen. Passage of more gas than usual.  Walking can help get rid of the air that was put into your GI tract during the procedure and reduce the bloating. If you had a lower endoscopy (such as a colonoscopy or flexible sigmoidoscopy) you may notice spotting of blood in your stool or on the toilet paper. If you underwent a bowel prep for your procedure, you may not have a normal bowel movement for a few days.  Please Note:  You might notice some irritation and congestion in your nose or some drainage.  This is from the oxygen used during your procedure.  There is no need for concern and it should clear up in a day or so.  SYMPTOMS TO REPORT IMMEDIATELY:  Following upper Endoscopy:  Difficulty swallowing  Chest pain or pain behind your shoulder blades  Vomiting of blood  Fever of 100 or greater  Dark tarry stool  Shortness of breath   Following lower endoscopy (colonoscopy or flexible sigmoidoscopy):  Excessive amounts of blood in the stool  Significant tenderness or worsening of abdominal pains  Swelling of the abdomen that is new, acute  Fever of 100F or higher  PLease see handouts given to you on Polyps, Hemorrhoids, Diverticulosis, Hemorrhoid banding, Gastritis and Dilation diet. For urgent or emergent issues, a gastroenterologist can be reached at any hour by calling [phone].   DIET:  PLease see post dilation diet for today, then  you may proceed to your regular diet.  Drink plenty of fluids but you should avoid alcoholic beverages for 24 hours.  ACTIVITY:  You should plan to take it easy for the rest of today and you should NOT DRIVE or use heavy machinery until tomorrow (because of the sedation medicines used during the test).    FOLLOW UP: Our staff will call the number listed on your records the next business day following your procedure to check on you and address any questions or concerns that you may have regarding the information given to you following your procedure. If we do not reach you, we will leave a message.  However, if you are feeling well and you are not experiencing any problems, there is no need to return our call.  We will assume that you have returned to your regular daily activities without incident.  If any biopsies were taken you will be contacted by phone or by letter within the next 1-3 weeks.  Please call us at [phone] if you have not heard about the biopsies in 3 weeks.    SIGNATURES/CONFIDENTIALITY: You and/or your care partner have signed paperwork which will be entered into your electronic medical record.  These signatures attest to the fact that that the information above on your After Visit Summary has been reviewed and is understood.  Full responsibility of the confidentiality of this discharge information lies with you and/or your care-partner.  Thank you for letting us take care of  your healthcare needs today.

## 2018-08-21 ENCOUNTER — Telehealth: Payer: Self-pay

## 2018-08-21 NOTE — Telephone Encounter (Signed)
Left message

## 2018-08-21 NOTE — Telephone Encounter (Signed)
  Follow up Call-  Call Miguel Brock number 08/20/2018  Post procedure Call Miguel Brock phone  # [phone]  Permission to leave phone message Yes  Some recent data might be hidden     Patient questions:  Do you have a fever, pain , or abdominal swelling? No. Pain Score  0 *  Have you tolerated food without any problems? Yes.    Have you been able to return to your normal activities? Yes.    Do you have any questions about your discharge instructions: Diet   No. Medications  No. Follow up visit  No.  Do you have questions or concerns about your Care? No.  Actions: * If pain score is 4 or above: No action needed, pain <4.

## 2018-09-03 ENCOUNTER — Encounter: Payer: Self-pay | Admitting: Gastroenterology

## 2018-09-08 ENCOUNTER — Encounter: Payer: Medicare Other | Admitting: Gastroenterology

## 2018-09-15 DIAGNOSIS — B353 Tinea pedis: Secondary | ICD-10-CM | POA: Diagnosis not present

## 2018-09-21 DIAGNOSIS — Z125 Encounter for screening for malignant neoplasm of prostate: Secondary | ICD-10-CM | POA: Diagnosis not present

## 2018-09-21 DIAGNOSIS — E7849 Other hyperlipidemia: Secondary | ICD-10-CM | POA: Diagnosis not present

## 2018-09-21 DIAGNOSIS — E559 Vitamin D deficiency, unspecified: Secondary | ICD-10-CM | POA: Diagnosis not present

## 2018-09-21 DIAGNOSIS — N183 Chronic kidney disease, stage 3 (moderate): Secondary | ICD-10-CM | POA: Diagnosis not present

## 2018-09-21 DIAGNOSIS — R82998 Other abnormal findings in urine: Secondary | ICD-10-CM | POA: Diagnosis not present

## 2018-09-21 DIAGNOSIS — R946 Abnormal results of thyroid function studies: Secondary | ICD-10-CM | POA: Diagnosis not present

## 2018-09-21 DIAGNOSIS — E1140 Type 2 diabetes mellitus with diabetic neuropathy, unspecified: Secondary | ICD-10-CM | POA: Diagnosis not present

## 2018-09-25 DIAGNOSIS — E1140 Type 2 diabetes mellitus with diabetic neuropathy, unspecified: Secondary | ICD-10-CM | POA: Diagnosis not present

## 2018-09-25 DIAGNOSIS — Z6830 Body mass index (BMI) 30.0-30.9, adult: Secondary | ICD-10-CM | POA: Diagnosis not present

## 2018-09-25 DIAGNOSIS — N183 Chronic kidney disease, stage 3 (moderate): Secondary | ICD-10-CM | POA: Diagnosis not present

## 2018-09-25 DIAGNOSIS — I129 Hypertensive chronic kidney disease with stage 1 through stage 4 chronic kidney disease, or unspecified chronic kidney disease: Secondary | ICD-10-CM | POA: Diagnosis not present

## 2018-09-25 DIAGNOSIS — E559 Vitamin D deficiency, unspecified: Secondary | ICD-10-CM | POA: Diagnosis not present

## 2018-09-25 DIAGNOSIS — Z1389 Encounter for screening for other disorder: Secondary | ICD-10-CM | POA: Diagnosis not present

## 2018-09-25 DIAGNOSIS — K222 Esophageal obstruction: Secondary | ICD-10-CM | POA: Diagnosis not present

## 2018-09-25 DIAGNOSIS — J449 Chronic obstructive pulmonary disease, unspecified: Secondary | ICD-10-CM | POA: Diagnosis not present

## 2018-09-25 DIAGNOSIS — Z0000 Encounter for general adult medical examination without abnormal findings: Secondary | ICD-10-CM | POA: Diagnosis not present

## 2018-09-25 DIAGNOSIS — K648 Other hemorrhoids: Secondary | ICD-10-CM | POA: Diagnosis not present

## 2018-09-25 DIAGNOSIS — E1169 Type 2 diabetes mellitus with other specified complication: Secondary | ICD-10-CM | POA: Diagnosis not present

## 2018-09-25 DIAGNOSIS — I10 Essential (primary) hypertension: Secondary | ICD-10-CM | POA: Diagnosis not present

## 2018-10-12 ENCOUNTER — Ambulatory Visit (INDEPENDENT_AMBULATORY_CARE_PROVIDER_SITE_OTHER): Payer: Medicare Other | Admitting: Gastroenterology

## 2018-10-12 ENCOUNTER — Encounter: Payer: Self-pay | Admitting: Gastroenterology

## 2018-10-12 VITALS — BP 120/78 | HR 68 | Ht 71.0 in | Wt 217.5 lb

## 2018-10-12 DIAGNOSIS — K642 Third degree hemorrhoids: Secondary | ICD-10-CM | POA: Diagnosis not present

## 2018-10-12 NOTE — Progress Notes (Signed)
PROCEDURE NOTE: The patient presents with symptomatic grade III  hemorrhoids, requesting rubber band ligation of his/her hemorrhoidal disease.  All risks, benefits and alternative forms of therapy were described and informed consent was obtained.  In the Left Lateral Decubitus position anoscopic examination revealed grade III hemorrhoids in the left lateral and Right anterior position(s), grade II hemorrhoids in the right posterior position.  The anorectum was pre-medicated with 0.125% Nitroglycerine and Recticare The decision was made to band the Left lateral internal hemorrhoid, and the Maybell was used to perform band ligation without complication.  Digital anorectal examination was then performed to assure proper positioning of the band, and to adjust the banded tissue as required.  The patient was discharged home without pain or other issues.  Dietary and behavioral recommendations were given and along with follow-up instructions.     The following adjunctive treatments were recommended: Benefiber 1 teaspoon TID with meals  The patient will return in 2 weeks for  follow-up and possible additional banding as required. No complications were encountered and the patient tolerated the procedure well  K. Denzil Magnuson , MD [phone]

## 2018-10-12 NOTE — Patient Instructions (Signed)

## 2018-10-15 DIAGNOSIS — D1801 Hemangioma of skin and subcutaneous tissue: Secondary | ICD-10-CM | POA: Diagnosis not present

## 2018-10-15 DIAGNOSIS — B353 Tinea pedis: Secondary | ICD-10-CM | POA: Diagnosis not present

## 2018-10-15 DIAGNOSIS — L814 Other melanin hyperpigmentation: Secondary | ICD-10-CM | POA: Diagnosis not present

## 2018-10-15 DIAGNOSIS — L570 Actinic keratosis: Secondary | ICD-10-CM | POA: Diagnosis not present

## 2018-10-15 DIAGNOSIS — L821 Other seborrheic keratosis: Secondary | ICD-10-CM | POA: Diagnosis not present

## 2018-10-15 DIAGNOSIS — L819 Disorder of pigmentation, unspecified: Secondary | ICD-10-CM | POA: Diagnosis not present

## 2018-10-15 DIAGNOSIS — D229 Melanocytic nevi, unspecified: Secondary | ICD-10-CM | POA: Diagnosis not present

## 2018-10-16 ENCOUNTER — Telehealth: Payer: Self-pay | Admitting: Gastroenterology

## 2018-10-16 ENCOUNTER — Other Ambulatory Visit: Payer: Self-pay

## 2018-10-16 DIAGNOSIS — K642 Third degree hemorrhoids: Secondary | ICD-10-CM

## 2018-10-16 MED ORDER — HYDROCORTISONE ACETATE 25 MG RE SUPP: 25.0000 mg | Freq: Two times a day (BID) | RECTAL | 0 refills | Status: DC

## 2018-10-16 NOTE — Telephone Encounter (Signed)
Pt would like to speak with you. Dr. Silverio Decamp asked him to call to update her on his progress.

## 2018-10-16 NOTE — Telephone Encounter (Signed)
Patient is instructed. All questions invited and answered. Rx to [facility] as per medication list.

## 2018-10-16 NOTE — Telephone Encounter (Signed)
Doc of the day Straining to have bowel movements. Taking Benefiber daily. Not on any anticoagulation. Hem band was on Monday 10/12/18. Patient states "straining so hard" and "water is as pink as watermelon." He is not on any stool softeners. Please advise.

## 2018-10-16 NOTE — Telephone Encounter (Signed)
Some blood as expected -Start stool softeners Colace 1 tablet p.o. once a day.  Do expect 3 to 4 days for it to work. -Drink plenty of water. -Continue Benefiber -Can use hydrocortisone suppositories 1 twice daily after the bowel movement and bedtime x 7 days -Please call if not better in 7 days.  Earlier, if the problems worsen

## 2018-10-19 NOTE — Telephone Encounter (Signed)
Called patient to follow up, didn't reach, left a message

## 2018-10-20 ENCOUNTER — Telehealth: Payer: Self-pay | Admitting: Gastroenterology

## 2018-10-21 NOTE — Telephone Encounter (Signed)
Left message to call back  

## 2018-10-21 NOTE — Telephone Encounter (Signed)
Spoke with the patient. He is triturating his stool softener. States there has been some improvement but he is still straining some. He has a sensation of being "swollen down there." He has not been using the Anusol HC suppositories as ordered. He will begin using this BID. Moistened wipes. Not seeing blood with the bowel movement at this time. Keep the next appointment.

## 2018-11-10 ENCOUNTER — Encounter: Payer: Medicare Other | Admitting: Gastroenterology

## 2018-11-24 DIAGNOSIS — I6521 Occlusion and stenosis of right carotid artery: Secondary | ICD-10-CM | POA: Diagnosis not present

## 2018-12-07 DIAGNOSIS — R12 Heartburn: Secondary | ICD-10-CM | POA: Diagnosis not present

## 2018-12-07 DIAGNOSIS — I6521 Occlusion and stenosis of right carotid artery: Secondary | ICD-10-CM | POA: Diagnosis not present

## 2018-12-07 DIAGNOSIS — I25119 Atherosclerotic heart disease of native coronary artery with unspecified angina pectoris: Secondary | ICD-10-CM | POA: Diagnosis not present

## 2018-12-07 DIAGNOSIS — I10 Essential (primary) hypertension: Secondary | ICD-10-CM | POA: Diagnosis not present

## 2018-12-21 ENCOUNTER — Inpatient Hospital Stay: Admission: RE | Admit: 2018-12-21 | Payer: Medicare Other | Source: Ambulatory Visit

## 2019-01-05 DIAGNOSIS — M25511 Pain in right shoulder: Secondary | ICD-10-CM | POA: Diagnosis not present

## 2019-01-05 DIAGNOSIS — M19011 Primary osteoarthritis, right shoulder: Secondary | ICD-10-CM | POA: Diagnosis not present

## 2019-02-09 ENCOUNTER — Other Ambulatory Visit: Payer: Self-pay | Admitting: Cardiology

## 2019-02-16 ENCOUNTER — Encounter: Payer: Self-pay | Admitting: Cardiology

## 2019-02-16 ENCOUNTER — Ambulatory Visit (INDEPENDENT_AMBULATORY_CARE_PROVIDER_SITE_OTHER): Payer: Medicare Other | Admitting: Cardiology

## 2019-02-16 ENCOUNTER — Telehealth: Payer: Self-pay

## 2019-02-16 VITALS — BP 164/74 | HR 59 | Ht 71.0 in | Wt 223.5 lb

## 2019-02-16 DIAGNOSIS — Z0189 Encounter for other specified special examinations: Secondary | ICD-10-CM

## 2019-02-16 DIAGNOSIS — K219 Gastro-esophageal reflux disease without esophagitis: Secondary | ICD-10-CM | POA: Diagnosis not present

## 2019-02-16 DIAGNOSIS — E1165 Type 2 diabetes mellitus with hyperglycemia: Secondary | ICD-10-CM | POA: Diagnosis not present

## 2019-02-16 DIAGNOSIS — E782 Mixed hyperlipidemia: Secondary | ICD-10-CM | POA: Diagnosis not present

## 2019-02-16 DIAGNOSIS — I10 Essential (primary) hypertension: Secondary | ICD-10-CM

## 2019-02-16 DIAGNOSIS — M79604 Pain in right leg: Secondary | ICD-10-CM | POA: Diagnosis not present

## 2019-02-16 DIAGNOSIS — E1169 Type 2 diabetes mellitus with other specified complication: Secondary | ICD-10-CM | POA: Diagnosis not present

## 2019-02-16 DIAGNOSIS — Z6830 Body mass index (BMI) 30.0-30.9, adult: Secondary | ICD-10-CM | POA: Diagnosis not present

## 2019-02-16 DIAGNOSIS — R0602 Shortness of breath: Secondary | ICD-10-CM | POA: Diagnosis not present

## 2019-02-16 DIAGNOSIS — M79605 Pain in left leg: Secondary | ICD-10-CM | POA: Diagnosis not present

## 2019-02-16 DIAGNOSIS — I209 Angina pectoris, unspecified: Secondary | ICD-10-CM

## 2019-02-16 DIAGNOSIS — J449 Chronic obstructive pulmonary disease, unspecified: Secondary | ICD-10-CM | POA: Diagnosis not present

## 2019-02-16 DIAGNOSIS — M79602 Pain in left arm: Secondary | ICD-10-CM | POA: Diagnosis not present

## 2019-02-16 DIAGNOSIS — I25118 Atherosclerotic heart disease of native coronary artery with other forms of angina pectoris: Secondary | ICD-10-CM | POA: Diagnosis not present

## 2019-02-16 DIAGNOSIS — I6529 Occlusion and stenosis of unspecified carotid artery: Secondary | ICD-10-CM | POA: Diagnosis not present

## 2019-02-16 DIAGNOSIS — K644 Residual hemorrhoidal skin tags: Secondary | ICD-10-CM | POA: Diagnosis not present

## 2019-02-16 MED ORDER — AMLODIPINE BESYLATE 10 MG PO TABS: 10.0000 mg | ORAL_TABLET | Freq: Every day | ORAL | 2 refills | Status: DC

## 2019-02-16 MED ORDER — NITROGLYCERIN 0.4 MG SL SUBL: 0.4000 mg | SUBLINGUAL_TABLET | SUBLINGUAL | 1 refills | Status: AC | PRN

## 2019-02-16 MED ORDER — NITROGLYCERIN 0.4 MG SL SUBL: 0.4000 mg | SUBLINGUAL_TABLET | SUBLINGUAL | 1 refills | Status: DC | PRN
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
## 2019-02-19 ENCOUNTER — Other Ambulatory Visit: Payer: Self-pay

## 2019-02-19 MED ORDER — AMLODIPINE BESYLATE 10 MG PO TABS: 10.0000 mg | ORAL_TABLET | Freq: Every day | ORAL | 3 refills | Status: DC

## 2019-02-22 ENCOUNTER — Other Ambulatory Visit (HOSPITAL_COMMUNITY): Payer: Self-pay | Admitting: Internal Medicine

## 2019-02-22 ENCOUNTER — Other Ambulatory Visit: Payer: Medicare Other

## 2019-02-22 DIAGNOSIS — G8929 Other chronic pain: Secondary | ICD-10-CM

## 2019-02-22 DIAGNOSIS — M79606 Pain in leg, unspecified: Principal | ICD-10-CM

## 2019-02-23 ENCOUNTER — Ambulatory Visit: Payer: Medicare Other

## 2019-02-23 DIAGNOSIS — R0602 Shortness of breath: Secondary | ICD-10-CM

## 2019-02-23 DIAGNOSIS — I209 Angina pectoris, unspecified: Secondary | ICD-10-CM

## 2019-02-24 ENCOUNTER — Ambulatory Visit (HOSPITAL_COMMUNITY)
Admission: RE | Admit: 2019-02-24 | Discharge: 2019-02-24 | Disposition: A | Discharge status: Home / Self Care | Payer: Medicare Other | Source: Ambulatory Visit | Attending: Vascular Surgery | Admitting: Vascular Surgery

## 2019-02-24 DIAGNOSIS — G8929 Other chronic pain: Secondary | ICD-10-CM | POA: Diagnosis not present

## 2019-02-24 DIAGNOSIS — M79606 Pain in leg, unspecified: Secondary | ICD-10-CM

## 2019-03-03 ENCOUNTER — Ambulatory Visit: Payer: Medicare Other

## 2019-03-03 DIAGNOSIS — I209 Angina pectoris, unspecified: Secondary | ICD-10-CM

## 2019-03-03 DIAGNOSIS — I200 Unstable angina: Secondary | ICD-10-CM | POA: Diagnosis not present

## 2019-03-05 ENCOUNTER — Other Ambulatory Visit: Payer: Self-pay | Admitting: Cardiology

## 2019-03-05 DIAGNOSIS — I6521 Occlusion and stenosis of right carotid artery: Secondary | ICD-10-CM

## 2019-03-05 NOTE — Telephone Encounter (Signed)
Normal stress test

## 2019-03-05 NOTE — Telephone Encounter (Signed)
S/W PT ADVISED HIM OF NORMAL STRESS TEST

## 2019-03-30 ENCOUNTER — Encounter: Payer: Self-pay | Admitting: Cardiology

## 2019-03-30 DIAGNOSIS — I10 Essential (primary) hypertension: Secondary | ICD-10-CM | POA: Insufficient documentation

## 2019-03-30 HISTORY — DX: Essential (primary) hypertension: I10
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
## 2019-03-31 ENCOUNTER — Ambulatory Visit: Payer: TRICARE For Life (TFL) | Admitting: Cardiology

## 2019-05-06 ENCOUNTER — Ambulatory Visit: Payer: TRICARE For Life (TFL) | Admitting: Cardiology

## 2019-05-07 ENCOUNTER — Other Ambulatory Visit: Payer: Self-pay | Admitting: Cardiology

## 2019-05-07 NOTE — Telephone Encounter (Signed)
Please fill

## 2019-05-17 DIAGNOSIS — I129 Hypertensive chronic kidney disease with stage 1 through stage 4 chronic kidney disease, or unspecified chronic kidney disease: Secondary | ICD-10-CM | POA: Diagnosis not present

## 2019-05-17 DIAGNOSIS — E1169 Type 2 diabetes mellitus with other specified complication: Secondary | ICD-10-CM | POA: Diagnosis not present

## 2019-05-17 DIAGNOSIS — N183 Chronic kidney disease, stage 3 (moderate): Secondary | ICD-10-CM | POA: Diagnosis not present

## 2019-05-17 DIAGNOSIS — R600 Localized edema: Secondary | ICD-10-CM | POA: Diagnosis not present

## 2019-05-17 DIAGNOSIS — E785 Hyperlipidemia, unspecified: Secondary | ICD-10-CM | POA: Diagnosis not present

## 2019-05-18 DIAGNOSIS — I129 Hypertensive chronic kidney disease with stage 1 through stage 4 chronic kidney disease, or unspecified chronic kidney disease: Secondary | ICD-10-CM | POA: Diagnosis not present

## 2019-05-18 DIAGNOSIS — E1169 Type 2 diabetes mellitus with other specified complication: Secondary | ICD-10-CM | POA: Diagnosis not present

## 2019-05-26 ENCOUNTER — Other Ambulatory Visit: Payer: Medicare Other

## 2019-05-26 ENCOUNTER — Other Ambulatory Visit: Payer: Self-pay

## 2019-05-26 DIAGNOSIS — I10 Essential (primary) hypertension: Secondary | ICD-10-CM

## 2019-05-26 MED ORDER — LOSARTAN POTASSIUM 100 MG PO TABS: 100.0000 mg | ORAL_TABLET | Freq: Every day | ORAL | 3 refills | Status: DC

## 2019-06-04 ENCOUNTER — Other Ambulatory Visit: Payer: Medicare Other

## 2019-06-11 ENCOUNTER — Ambulatory Visit: Payer: TRICARE For Life (TFL) | Admitting: Cardiology

## 2019-06-15 DIAGNOSIS — M25512 Pain in left shoulder: Secondary | ICD-10-CM | POA: Diagnosis not present

## 2019-06-15 DIAGNOSIS — M19011 Primary osteoarthritis, right shoulder: Secondary | ICD-10-CM | POA: Diagnosis not present

## 2019-06-15 DIAGNOSIS — M25511 Pain in right shoulder: Secondary | ICD-10-CM | POA: Diagnosis not present

## 2019-06-28 DIAGNOSIS — B353 Tinea pedis: Secondary | ICD-10-CM | POA: Diagnosis not present

## 2019-06-28 DIAGNOSIS — L309 Dermatitis, unspecified: Secondary | ICD-10-CM | POA: Diagnosis not present

## 2019-06-29 DIAGNOSIS — M25511 Pain in right shoulder: Secondary | ICD-10-CM | POA: Diagnosis not present

## 2019-06-29 DIAGNOSIS — M25512 Pain in left shoulder: Secondary | ICD-10-CM | POA: Diagnosis not present

## 2019-07-07 DIAGNOSIS — M19011 Primary osteoarthritis, right shoulder: Secondary | ICD-10-CM | POA: Diagnosis not present

## 2019-07-07 DIAGNOSIS — M25512 Pain in left shoulder: Secondary | ICD-10-CM | POA: Diagnosis not present

## 2019-07-07 DIAGNOSIS — M75102 Unspecified rotator cuff tear or rupture of left shoulder, not specified as traumatic: Secondary | ICD-10-CM | POA: Diagnosis not present

## 2019-07-07 DIAGNOSIS — M7502 Adhesive capsulitis of left shoulder: Secondary | ICD-10-CM | POA: Diagnosis not present

## 2019-07-07 DIAGNOSIS — M19019 Primary osteoarthritis, unspecified shoulder: Secondary | ICD-10-CM | POA: Diagnosis not present

## 2019-07-07 DIAGNOSIS — M25511 Pain in right shoulder: Secondary | ICD-10-CM | POA: Diagnosis not present

## 2019-07-13 ENCOUNTER — Other Ambulatory Visit: Payer: Medicare Other

## 2019-07-16 ENCOUNTER — Other Ambulatory Visit: Payer: Self-pay

## 2019-07-16 ENCOUNTER — Ambulatory Visit (INDEPENDENT_AMBULATORY_CARE_PROVIDER_SITE_OTHER): Payer: Medicare Other

## 2019-07-16 DIAGNOSIS — I6521 Occlusion and stenosis of right carotid artery: Secondary | ICD-10-CM

## 2019-07-19 ENCOUNTER — Encounter: Payer: Self-pay | Admitting: Cardiology

## 2019-07-21 ENCOUNTER — Other Ambulatory Visit: Payer: Self-pay

## 2019-07-21 ENCOUNTER — Ambulatory Visit (INDEPENDENT_AMBULATORY_CARE_PROVIDER_SITE_OTHER): Payer: Medicare Other | Admitting: Cardiology

## 2019-07-21 ENCOUNTER — Encounter: Payer: Self-pay | Admitting: Cardiology

## 2019-07-21 VITALS — BP 135/66 | HR 60 | Ht 71.0 in | Wt 211.0 lb

## 2019-07-21 DIAGNOSIS — I6523 Occlusion and stenosis of bilateral carotid arteries: Secondary | ICD-10-CM | POA: Diagnosis not present

## 2019-07-21 DIAGNOSIS — I10 Essential (primary) hypertension: Secondary | ICD-10-CM

## 2019-07-21 DIAGNOSIS — I2510 Atherosclerotic heart disease of native coronary artery without angina pectoris: Secondary | ICD-10-CM

## 2019-07-21 DIAGNOSIS — E782 Mixed hyperlipidemia: Secondary | ICD-10-CM

## 2019-07-21 DIAGNOSIS — Z01810 Encounter for preprocedural cardiovascular examination: Secondary | ICD-10-CM
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
## 2019-07-26 DIAGNOSIS — M25512 Pain in left shoulder: Secondary | ICD-10-CM | POA: Diagnosis not present

## 2019-08-02 ENCOUNTER — Other Ambulatory Visit: Payer: Self-pay | Admitting: Pulmonary Disease

## 2019-08-14 ENCOUNTER — Other Ambulatory Visit: Payer: Self-pay | Admitting: Cardiology

## 2019-08-14 DIAGNOSIS — I10 Essential (primary) hypertension: Secondary | ICD-10-CM

## 2019-08-30 ENCOUNTER — Other Ambulatory Visit: Payer: Self-pay

## 2019-08-30 DIAGNOSIS — I10 Essential (primary) hypertension: Secondary | ICD-10-CM

## 2019-08-30 MED ORDER — AMLODIPINE BESYLATE 10 MG PO TABS: 10.0000 mg | ORAL_TABLET | Freq: Every day | ORAL | 2 refills | Status: DC

## 2019-09-09 ENCOUNTER — Encounter (HOSPITAL_COMMUNITY): Payer: Self-pay

## 2019-09-09 NOTE — Patient Instructions (Addendum)
DUE TO COVID-19 ONLY ONE VISITOR IS ALLOWED TO COME WITH YOU AND STAY IN THE WAITING ROOM ONLY DURING PRE OP AND PROCEDURE. THE ONE VISITOR MAY VISIT WITH YOU IN YOUR PRIVATE ROOM DURING VISITING HOURS ONLY!!   COVID SWAB TESTING MUST BE COMPLETED ON: Today immediately after pre op appointment.  [address] [place] [facility] enter pre surgical testing line (Must self quarantine after testing. Follow instructions on handout.)             Your procedure is scheduled on: Friday, Sept. 18, 2020   Report to [facility] Main  Entrance    Report to admitting at 8:15 AM   Call this number if you have problems the morning of surgery [phone]   Do not eat food or drink liquids :After Midnight.   Brush your teeth the morning of surgery.   Do NOT smoke after Midnight   Take these medicines the morning of surgery with A SIP OF WATER: Amlodipine, Ranitidine, Toprol   Use inhaler day of surgery  DO NOT TAKE ANY DIABETIC MEDICATIONS DAY OF YOUR SURGERY                               You may not have any metal on your body including jewelry, and body piercings             Do not wear lotions, powders, perfumes/cologne, or deodorant                          Men may shave face and neck.   Do not bring valuables to the hospital. [place].   Contacts, dentures or bridgework may not be worn into surgery.   Bring small overnight bag day of surgery.    Special Instructions: Bring a copy of your healthcare power of attorney and living will documents         the day of surgery if you haven't scanned them in before.              Please read over the following fact sheets you were given:  How to Manage Your Diabetes Before and After Surgery  Why is it important to control my blood sugar before and after surgery? . Improving blood sugar levels before and after surgery helps healing and can limit  problems. . A way of improving blood sugar control is eating a healthy diet by: o  Eating less sugar and carbohydrates o  Increasing activity/exercise o  Talking with your doctor about reaching your blood sugar goals . High blood sugars (greater than 180 mg/dL) can raise your risk of infections and slow your recovery, so you will need to focus on controlling your diabetes during the weeks before surgery. . Make sure that the doctor who takes care of your diabetes knows about your planned surgery including the date and location.  How do I manage my blood sugar before surgery? . Check your blood sugar at least 4 times a day, starting 2 days before surgery, to make sure that the level is not too high or low. o Check your blood sugar the morning of your surgery when you wake up and every 2 hours until you get to the Short Stay unit. . If your blood  sugar is less than 70 mg/dL, you will need to treat for low blood sugar: o Do not take insulin. o Treat a low blood sugar (less than 70 mg/dL) with  cup of clear juice (cranberry or apple), 4 glucose tablets, OR glucose gel. o Recheck blood sugar in 15 minutes after treatment (to make sure it is greater than 70 mg/dL). If your blood sugar is not greater than 70 mg/dL on recheck, call [phone] for further instructions. . Report your blood sugar to the short stay nurse when you get to Short Stay.  . If you are admitted to the hospital after surgery: o Your blood sugar will be checked by the staff and you will probably be given insulin after surgery (instead of oral diabetes medicines) to make sure you have good blood sugar levels. o The goal for blood sugar control after surgery is 80-180 mg/dL.   WHAT DO I DO ABOUT MY DIABETES MEDICATION?  Thursday, Sept 17, 2020: Take Metformin per normal routine   . Friday, Sept. 18, 2020:  Do not take oral diabetes medicines (pills) the morning of surgery.    Reviewed and Endorsed by [facility] Patient  Education Committee, August 2015  [facility] - Preparing for Surgery Before surgery, you can play an important role.  Because skin is not sterile, your skin needs to be as free of germs as possible.  You can reduce the number of germs on your skin by washing with CHG (chlorahexidine gluconate) soap before surgery.  CHG is an antiseptic cleaner which kills germs and bonds with the skin to continue killing germs even after washing. Please DO NOT use if you have an allergy to CHG or antibacterial soaps.  If your skin becomes reddened/irritated stop using the CHG and inform your nurse when you arrive at Short Stay. Do not shave (including legs and underarms) for at least 48 hours prior to the first CHG shower.  You may shave your face/neck.  Please follow these instructions carefully:  1.  Shower with CHG Soap the night before surgery and the  morning of surgery.  2.  If you choose to wash your hair, wash your hair first as usual with your normal  shampoo.  3.  After you shampoo, rinse your hair and body thoroughly to remove the shampoo.                             4.  Use CHG as you would any other liquid soap.  You can apply chg directly to the skin and wash.  Gently with a scrungie or clean washcloth.  5.  Apply the CHG Soap to your body ONLY FROM THE NECK DOWN.   Do   not use on face/ open                           Wound or open sores. Avoid contact with eyes, ears mouth and   genitals (private parts).                       Wash face,  Genitals (private parts) with your normal soap.             6.  Wash thoroughly, paying special attention to the area where your    surgery  will be performed.  7.  Thoroughly rinse your body with warm water from the neck down.  8.  DO NOT shower/wash with your normal soap after using and rinsing off the CHG Soap.                9.  Pat yourself dry with a clean towel.            10.  Wear clean pajamas.            11.  Place clean sheets on your bed the night of  your first shower and do not  sleep with pets. Day of Surgery : Do not apply any lotions/deodorants the morning of surgery.  Please wear clean clothes to the hospital/surgery center.  FAILURE TO FOLLOW THESE INSTRUCTIONS MAY RESULT IN THE CANCELLATION OF YOUR SURGERY  PATIENT SIGNATURE_________________________________  NURSE SIGNATURE__________________________________  ________________________________________________________________________

## 2019-09-14 ENCOUNTER — Encounter (HOSPITAL_COMMUNITY)
Admission: RE | Admit: 2019-09-14 | Discharge: 2019-09-14 | Disposition: A | Discharge status: Home / Self Care | Payer: Medicare Other | Source: Ambulatory Visit | Attending: Orthopedic Surgery | Admitting: Orthopedic Surgery

## 2019-09-14 ENCOUNTER — Other Ambulatory Visit (HOSPITAL_COMMUNITY)
Admission: RE | Admit: 2019-09-14 | Discharge: 2019-09-14 | Disposition: A | Discharge status: Home / Self Care | Payer: Medicare Other | Source: Ambulatory Visit | Attending: Orthopedic Surgery | Admitting: Orthopedic Surgery

## 2019-09-14 ENCOUNTER — Other Ambulatory Visit: Payer: Self-pay

## 2019-09-14 ENCOUNTER — Encounter (HOSPITAL_COMMUNITY): Payer: Self-pay

## 2019-09-14 DIAGNOSIS — E119 Type 2 diabetes mellitus without complications: Secondary | ICD-10-CM | POA: Insufficient documentation

## 2019-09-14 DIAGNOSIS — Z981 Arthrodesis status: Secondary | ICD-10-CM | POA: Insufficient documentation

## 2019-09-14 DIAGNOSIS — M12812 Other specific arthropathies, not elsewhere classified, left shoulder: Secondary | ICD-10-CM | POA: Insufficient documentation

## 2019-09-14 DIAGNOSIS — J449 Chronic obstructive pulmonary disease, unspecified: Secondary | ICD-10-CM | POA: Insufficient documentation

## 2019-09-14 DIAGNOSIS — I6523 Occlusion and stenosis of bilateral carotid arteries: Secondary | ICD-10-CM | POA: Insufficient documentation

## 2019-09-14 DIAGNOSIS — Z87891 Personal history of nicotine dependence: Secondary | ICD-10-CM | POA: Insufficient documentation

## 2019-09-14 DIAGNOSIS — Z955 Presence of coronary angioplasty implant and graft: Secondary | ICD-10-CM | POA: Insufficient documentation

## 2019-09-14 DIAGNOSIS — Z7982 Long term (current) use of aspirin: Secondary | ICD-10-CM | POA: Insufficient documentation

## 2019-09-14 DIAGNOSIS — I2510 Atherosclerotic heart disease of native coronary artery without angina pectoris: Secondary | ICD-10-CM | POA: Insufficient documentation

## 2019-09-14 DIAGNOSIS — Z7984 Long term (current) use of oral hypoglycemic drugs: Secondary | ICD-10-CM | POA: Insufficient documentation

## 2019-09-14 DIAGNOSIS — I10 Essential (primary) hypertension: Secondary | ICD-10-CM | POA: Insufficient documentation

## 2019-09-14 DIAGNOSIS — F4310 Post-traumatic stress disorder, unspecified: Secondary | ICD-10-CM | POA: Insufficient documentation

## 2019-09-14 DIAGNOSIS — Z79899 Other long term (current) drug therapy: Secondary | ICD-10-CM | POA: Insufficient documentation

## 2019-09-14 DIAGNOSIS — K219 Gastro-esophageal reflux disease without esophagitis: Secondary | ICD-10-CM | POA: Insufficient documentation

## 2019-09-14 DIAGNOSIS — Z01818 Encounter for other preprocedural examination: Secondary | ICD-10-CM | POA: Insufficient documentation

## 2019-09-14 DIAGNOSIS — B353 Tinea pedis: Secondary | ICD-10-CM | POA: Diagnosis not present

## 2019-09-14 HISTORY — DX: Occlusion and stenosis of unspecified carotid artery: I65.29

## 2019-09-14 HISTORY — DX: Personal history of other diseases of the nervous system and sense organs: Z86.69

## 2019-09-14 LAB — CBC
HCT: 40.2 % (ref 39.0–52.0)
Hemoglobin: 13.5 g/dL (ref 13.0–17.0)
MCH: 32.8 pg (ref 26.0–34.0)
MCHC: 33.6 g/dL (ref 30.0–36.0)
MCV: 97.6 fL (ref 80.0–100.0)
Platelets: 319 10*3/uL (ref 150–400)
RBC: 4.12 MIL/uL — ABNORMAL LOW (ref 4.22–5.81)
RDW: 12.5 % (ref 11.5–15.5)
WBC: 10.7 10*3/uL — ABNORMAL HIGH (ref 4.0–10.5)
nRBC: 0 % (ref 0.0–0.2)

## 2019-09-14 LAB — BASIC METABOLIC PANEL
Anion gap: 10 (ref 5–15)
BUN: 31 mg/dL — ABNORMAL HIGH (ref 8–23)
CO2: 25 mmol/L (ref 22–32)
Calcium: 9.8 mg/dL (ref 8.9–10.3)
Chloride: 105 mmol/L (ref 98–111)
Creatinine, Ser: 1.43 mg/dL — ABNORMAL HIGH (ref 0.61–1.24)
GFR calc Af Amer: 56 mL/min — ABNORMAL LOW (ref >60)
GFR calc non Af Amer: 48 mL/min — ABNORMAL LOW (ref >60)
Glucose, Bld: 126 mg/dL — ABNORMAL HIGH (ref 70–99)
Potassium: 4 mmol/L (ref 3.5–5.1)
Sodium: 140 mmol/L (ref 135–145)

## 2019-09-14 LAB — GLUCOSE, CAPILLARY: Glucose-Capillary: 100 mg/dL — ABNORMAL HIGH (ref 70–99)
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
## 2019-09-14 NOTE — Progress Notes (Signed)
SPOKE W/  Rica Mote     SCREENING SYMPTOMS OF COVID 19:   COUGH--NO  RUNNY NOSE--- NO  SORE THROAT---NO  NASAL CONGESTION----NO  SNEEZING----NO  SHORTNESS OF BREATH---NO  DIFFICULTY BREATHING---NO  TEMP >100.0 -----NO  UNEXPLAINED BODY ACHES------NO  CHILLS -------- NO  HEADACHES ---------NO  LOSS OF SMELL/ TASTE --------NO    HAVE YOU OR ANY FAMILY MEMBER TRAVELLED PAST 14 DAYS OUT OF THE   COUNTY---Travelled to [facility] STATE----Travelled to [facility] COUNTRY----NO  HAVE YOU OR ANY FAMILY MEMBER BEEN EXPOSED TO ANYONE WITH COVID 19? NO

## 2019-09-14 NOTE — Progress Notes (Signed)
PCP - Dr. Velna Hatchet Cardiologist - Dr. Adrian Prows  Chest x-ray - N/A EKG - 02/16/2019 in epic Stress Test - 03/03/2019 in epic ECHO - 02/23/2019 in epic Cardiac Cath - N/A  Sleep Study - N/A CPAP - N/A  Fasting Blood Sugar - unknown Checks Blood Sugar __does not check___ times a day  Blood Thinner Instructions: No Aspirin Instructions: NO Last Dose:Currently still taking Aspirin instructed patient to contact Dr. Veverly Fells for further instructions  Anesthesia review: N/A  Patient denies shortness of breath, fever, cough and chest pain at PAT appointment   Patient verbalized understanding of instructions that were given to them at the PAT appointment. Patient was also instructed that they will need to review over the PAT instructions again at home before surgery.

## 2019-09-15 LAB — HEMOGLOBIN A1C
Hgb A1c MFr Bld: 6 % — ABNORMAL HIGH (ref 4.8–5.6)
Mean Plasma Glucose: 126 mg/dL

## 2019-09-15 LAB — SURGICAL PCR SCREEN
MRSA, PCR: POSITIVE — AB
Staphylococcus aureus: POSITIVE — AB

## 2019-09-15 LAB — NOVEL CORONAVIRUS, NAA (HOSP ORDER, SEND-OUT TO REF LAB; TAT 18-24 HRS): SARS-CoV-2, NAA: NOT DETECTED

## 2019-09-15 NOTE — Anesthesia Preprocedure Evaluation (Addendum)
Anesthesia Evaluation  Patient identified by MRN, date of birth, ID band Patient awake    Reviewed: Allergy & Precautions, NPO status , Patient's Chart, lab work & pertinent test results  History of Anesthesia Complications Negative for: history of anesthetic complications  Airway Mallampati: III  TM Distance: >3 FB Neck ROM: Full    Dental  (+) Dental Advisory Given   Pulmonary shortness of breath, COPD, neg recent URI, former smoker,  Stopped taking inhalers 6 months ago   breath sounds clear to auscultation       Cardiovascular hypertension, Pt. on medications (-) angina+ CAD and + Cardiac Stents   Rhythm:Regular     Neuro/Psych PSYCHIATRIC DISORDERS Anxiety Depression negative neurological ROS     GI/Hepatic Neg liver ROS, hiatal hernia, GERD  Controlled and Medicated,  Endo/Other  diabetesAlpha galactosidase deficiency  Renal/GU Renal InsufficiencyRenal disease     Musculoskeletal   Abdominal   Peds  Hematology   Anesthesia Other Findings 73 y.o. former smoker (34.5 pack years, quit 07/05/81) with h/o GERD, HTN, DM II, CAD (S/P stenting to mid LAD and circumflex coronary artery on 05/28/2011 and to mid RCA on 06/10/2011 with DES to the left coronary and nondrug eluding stent to the right coronary artery and balloon PTCA to PDA on 05/24/2015), COPD, asymptomatic bilateral carotid stenosis, PTSD, migraines, left shoulder cuff arthroplasty scheduled for above procedure 9/18/  Last seen by cardiologist, Dr. Adrian Prows, 07/21/2019.  Stable at this visit with 6 month follow up recommended.  Per OV note, "He is scheduled for left shoulder arthroplasty, he can be taken up for the upcoming surgery with acceptable cardiovascular risk."   Reproductive/Obstetrics                           Anesthesia Physical Anesthesia Plan  ASA: III  Anesthesia Plan: General and Regional   Post-op Pain Management:   Regional for Post-op pain   Induction: Intravenous  PONV Risk Score and Plan: 2 and Ondansetron and Dexamethasone  Airway Management Planned: Oral ETT  Additional Equipment: None  Intra-op Plan:   Post-operative Plan: Extubation in OR  Informed Consent: I have reviewed the patients History and Physical, chart, labs and discussed the procedure including the risks, benefits and alternatives for the proposed anesthesia with the patient or authorized representative who has indicated his/her understanding and acceptance.     Dental advisory given  Plan Discussed with: CRNA and Surgeon  Anesthesia Plan Comments: (See PAT note 09/14/2019, Konrad Felix, PA-C)      Anesthesia Quick Evaluation
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
## 2019-09-16 NOTE — Progress Notes (Signed)
Spoke with Miguel Brock via telephone he is aware to arrive at 5:30Am 09/17/2019 and report to short stay, he verbalized understanding.

## 2019-09-17 ENCOUNTER — Inpatient Hospital Stay (HOSPITAL_COMMUNITY): Payer: Medicare Other

## 2019-09-17 ENCOUNTER — Encounter (HOSPITAL_COMMUNITY)
Admission: RE | Disposition: A | Discharge status: Home / Self Care | Payer: Self-pay | Source: Other Acute Inpatient Hospital | Attending: Orthopedic Surgery

## 2019-09-17 ENCOUNTER — Inpatient Hospital Stay (HOSPITAL_COMMUNITY): Payer: Medicare Other | Admitting: Physician Assistant

## 2019-09-17 ENCOUNTER — Inpatient Hospital Stay (HOSPITAL_COMMUNITY): Payer: Medicare Other | Admitting: Registered Nurse

## 2019-09-17 ENCOUNTER — Inpatient Hospital Stay (HOSPITAL_COMMUNITY)
Admission: RE | Admit: 2019-09-17 | Discharge: 2019-09-18 | DRG: 483 | Disposition: A | Discharge status: Home / Self Care | Payer: Medicare Other | Attending: Orthopedic Surgery | Admitting: Orthopedic Surgery

## 2019-09-17 ENCOUNTER — Other Ambulatory Visit: Payer: Self-pay

## 2019-09-17 ENCOUNTER — Encounter (HOSPITAL_COMMUNITY): Payer: Self-pay

## 2019-09-17 DIAGNOSIS — F4310 Post-traumatic stress disorder, unspecified: Secondary | ICD-10-CM | POA: Diagnosis not present

## 2019-09-17 DIAGNOSIS — Z79891 Long term (current) use of opiate analgesic: Secondary | ICD-10-CM

## 2019-09-17 DIAGNOSIS — Z87891 Personal history of nicotine dependence: Secondary | ICD-10-CM

## 2019-09-17 DIAGNOSIS — Z791 Long term (current) use of non-steroidal anti-inflammatories (NSAID): Secondary | ICD-10-CM | POA: Diagnosis not present

## 2019-09-17 DIAGNOSIS — Z7984 Long term (current) use of oral hypoglycemic drugs: Secondary | ICD-10-CM

## 2019-09-17 DIAGNOSIS — I2510 Atherosclerotic heart disease of native coronary artery without angina pectoris: Secondary | ICD-10-CM | POA: Diagnosis present

## 2019-09-17 DIAGNOSIS — Z471 Aftercare following joint replacement surgery: Secondary | ICD-10-CM | POA: Diagnosis not present

## 2019-09-17 DIAGNOSIS — Z96612 Presence of left artificial shoulder joint: Secondary | ICD-10-CM | POA: Diagnosis not present

## 2019-09-17 DIAGNOSIS — G8918 Other acute postprocedural pain: Secondary | ICD-10-CM | POA: Diagnosis not present

## 2019-09-17 DIAGNOSIS — M19012 Primary osteoarthritis, left shoulder: Principal | ICD-10-CM | POA: Diagnosis present

## 2019-09-17 DIAGNOSIS — K219 Gastro-esophageal reflux disease without esophagitis: Secondary | ICD-10-CM | POA: Diagnosis present

## 2019-09-17 DIAGNOSIS — Z955 Presence of coronary angioplasty implant and graft: Secondary | ICD-10-CM | POA: Diagnosis not present

## 2019-09-17 DIAGNOSIS — F329 Major depressive disorder, single episode, unspecified: Secondary | ICD-10-CM | POA: Diagnosis present

## 2019-09-17 DIAGNOSIS — Z981 Arthrodesis status: Secondary | ICD-10-CM | POA: Diagnosis not present

## 2019-09-17 DIAGNOSIS — I6521 Occlusion and stenosis of right carotid artery: Secondary | ICD-10-CM | POA: Diagnosis present

## 2019-09-17 DIAGNOSIS — E119 Type 2 diabetes mellitus without complications: Secondary | ICD-10-CM | POA: Diagnosis not present

## 2019-09-17 DIAGNOSIS — Z888 Allergy status to other drugs, medicaments and biological substances status: Secondary | ICD-10-CM

## 2019-09-17 DIAGNOSIS — I10 Essential (primary) hypertension: Secondary | ICD-10-CM | POA: Diagnosis not present

## 2019-09-17 DIAGNOSIS — J449 Chronic obstructive pulmonary disease, unspecified: Secondary | ICD-10-CM | POA: Diagnosis not present

## 2019-09-17 DIAGNOSIS — Z20828 Contact with and (suspected) exposure to other viral communicable diseases: Secondary | ICD-10-CM | POA: Diagnosis present

## 2019-09-17 DIAGNOSIS — M75102 Unspecified rotator cuff tear or rupture of left shoulder, not specified as traumatic: Secondary | ICD-10-CM | POA: Diagnosis not present

## 2019-09-17 DIAGNOSIS — Z79899 Other long term (current) drug therapy: Secondary | ICD-10-CM | POA: Diagnosis not present

## 2019-09-17 DIAGNOSIS — Z7982 Long term (current) use of aspirin: Secondary | ICD-10-CM

## 2019-09-17 HISTORY — PX: REVERSE SHOULDER ARTHROPLASTY: SHX5054

## 2019-09-17 LAB — GLUCOSE, CAPILLARY
Glucose-Capillary: 130 mg/dL — ABNORMAL HIGH (ref 70–99)
Glucose-Capillary: 185 mg/dL — ABNORMAL HIGH (ref 70–99)
Glucose-Capillary: 324 mg/dL — ABNORMAL HIGH (ref 70–99)
Glucose-Capillary: 167 mg/dL — ABNORMAL HIGH (ref 70–99)

## 2019-09-17 SURGERY — ARTHROPLASTY, SHOULDER, TOTAL, REVERSE
Anesthesia: Regional | Site: Shoulder | Laterality: Left

## 2019-09-17 MED ORDER — SODIUM CHLORIDE 0.9 % IR SOLN
Status: DC | PRN
  Administered 2019-09-17: 1000 mL

## 2019-09-17 MED ORDER — HYDROMORPHONE HCL 1 MG/ML IJ SOLN: 0.5000 mg | INTRAMUSCULAR | Status: DC | PRN

## 2019-09-17 MED ORDER — METOPROLOL SUCCINATE ER 50 MG PO TB24
100.0000 mg | ORAL_TABLET | Freq: Every day | ORAL | Status: DC
  Administered 2019-09-18: 100 mg via ORAL
  Filled 2019-09-17: qty 2

## 2019-09-17 MED ORDER — ONDANSETRON HCL 4 MG/2ML IJ SOLN: 4.0000 mg | Freq: Four times a day (QID) | INTRAMUSCULAR | Status: DC | PRN

## 2019-09-17 MED ORDER — FAMOTIDINE 20 MG PO TABS
20.0000 mg | ORAL_TABLET | Freq: Two times a day (BID) | ORAL | Status: DC
  Administered 2019-09-17 – 2019-09-18 (×2): 20 mg via ORAL
  Filled 2019-09-17 (×2): qty 1

## 2019-09-17 MED ORDER — METOCLOPRAMIDE HCL 5 MG/ML IJ SOLN: 5.0000 mg | Freq: Three times a day (TID) | INTRAMUSCULAR | Status: DC | PRN

## 2019-09-17 MED ORDER — ACETAMINOPHEN 10 MG/ML IV SOLN: 1000.0000 mg | Freq: Once | INTRAVENOUS | Status: DC | PRN

## 2019-09-17 MED ORDER — METFORMIN HCL 500 MG PO TABS
500.0000 mg | ORAL_TABLET | Freq: Every day | ORAL | Status: DC
  Administered 2019-09-17: 500 mg via ORAL
  Filled 2019-09-17: qty 1

## 2019-09-17 MED ORDER — OXYCODONE HCL 5 MG PO TABS: 5.0000 mg | ORAL_TABLET | Freq: Once | ORAL | Status: DC | PRN

## 2019-09-17 MED ORDER — BUPIVACAINE-EPINEPHRINE (PF) 0.5% -1:200000 IJ SOLN
INTRAMUSCULAR | Status: AC
  Filled 2019-09-17: qty 30

## 2019-09-17 MED ORDER — MIDAZOLAM HCL 2 MG/2ML IJ SOLN
INTRAMUSCULAR | Status: AC
  Filled 2019-09-17: qty 2

## 2019-09-17 MED ORDER — BISACODYL 10 MG RE SUPP: 10.0000 mg | Freq: Every day | RECTAL | Status: DC | PRN

## 2019-09-17 MED ORDER — FENTANYL CITRATE (PF) 250 MCG/5ML IJ SOLN
INTRAMUSCULAR | Status: DC | PRN
  Administered 2019-09-17: 50 ug via INTRAVENOUS

## 2019-09-17 MED ORDER — INSULIN ASPART 100 UNIT/ML ~~LOC~~ SOLN: 0.0000 [IU] | Freq: Every day | SUBCUTANEOUS | Status: DC

## 2019-09-17 MED ORDER — ATORVASTATIN CALCIUM 20 MG PO TABS
20.0000 mg | ORAL_TABLET | Freq: Every day | ORAL | Status: DC
  Administered 2019-09-17: 20 mg via ORAL
  Filled 2019-09-17: qty 1

## 2019-09-17 MED ORDER — FENTANYL CITRATE (PF) 250 MCG/5ML IJ SOLN
INTRAMUSCULAR | Status: AC
  Filled 2019-09-17: qty 5

## 2019-09-17 MED ORDER — POLYETHYLENE GLYCOL 3350 17 G PO PACK: 17.0000 g | PACK | Freq: Every day | ORAL | Status: DC | PRN

## 2019-09-17 MED ORDER — SUCCINYLCHOLINE CHLORIDE 200 MG/10ML IV SOSY
PREFILLED_SYRINGE | INTRAVENOUS | Status: DC | PRN
  Administered 2019-09-17: 100 mg via INTRAVENOUS

## 2019-09-17 MED ORDER — NAPROXEN SODIUM 220 MG PO TABS: 440.0000 mg | ORAL_TABLET | Freq: Every evening | ORAL | Status: DC | PRN

## 2019-09-17 MED ORDER — INSULIN ASPART 100 UNIT/ML ~~LOC~~ SOLN
0.0000 [IU] | Freq: Three times a day (TID) | SUBCUTANEOUS | Status: DC
  Administered 2019-09-17: 15 [IU] via SUBCUTANEOUS
  Administered 2019-09-18: 3 [IU] via SUBCUTANEOUS

## 2019-09-17 MED ORDER — MENTHOL 3 MG MT LOZG: 1.0000 | LOZENGE | OROMUCOSAL | Status: DC | PRN

## 2019-09-17 MED ORDER — BUPIVACAINE-EPINEPHRINE (PF) 0.5% -1:200000 IJ SOLN
INTRAMUSCULAR | Status: DC | PRN
  Administered 2019-09-17: 8 mL

## 2019-09-17 MED ORDER — LACTATED RINGERS IV SOLN
INTRAVENOUS | Status: DC
  Administered 2019-09-17 (×2): via INTRAVENOUS

## 2019-09-17 MED ORDER — DEXAMETHASONE SODIUM PHOSPHATE 10 MG/ML IJ SOLN
INTRAMUSCULAR | Status: DC | PRN
  Administered 2019-09-17: 10 mg via INTRAVENOUS

## 2019-09-17 MED ORDER — OXYCODONE HCL 5 MG PO TABS: 5.0000 mg | ORAL_TABLET | ORAL | Status: DC | PRN

## 2019-09-17 MED ORDER — PHENOL 1.4 % MT LIQD
1.0000 | OROMUCOSAL | Status: DC | PRN
  Filled 2019-09-17: qty 177

## 2019-09-17 MED ORDER — PROPOFOL 10 MG/ML IV BOLUS
INTRAVENOUS | Status: AC
  Filled 2019-09-17: qty 20

## 2019-09-17 MED ORDER — ACETAMINOPHEN 325 MG PO TABS: 325.0000 mg | ORAL_TABLET | Freq: Four times a day (QID) | ORAL | Status: DC | PRN

## 2019-09-17 MED ORDER — ONDANSETRON HCL 4 MG/2ML IJ SOLN
INTRAMUSCULAR | Status: DC | PRN
  Administered 2019-09-17: 4 mg via INTRAVENOUS

## 2019-09-17 MED ORDER — ACETAMINOPHEN 160 MG/5ML PO SOLN: 1000.0000 mg | Freq: Once | ORAL | Status: DC | PRN

## 2019-09-17 MED ORDER — LIDOCAINE 2% (20 MG/ML) 5 ML SYRINGE
INTRAMUSCULAR | Status: DC | PRN
  Administered 2019-09-17: 60 mg via INTRAVENOUS

## 2019-09-17 MED ORDER — CYCLOBENZAPRINE HCL 10 MG PO TABS: 10.0000 mg | ORAL_TABLET | Freq: Every day | ORAL | Status: DC | PRN

## 2019-09-17 MED ORDER — SODIUM CHLORIDE 0.9 % IV SOLN
INTRAVENOUS | Status: DC | PRN
  Administered 2019-09-17: 40 ug/min via INTRAVENOUS

## 2019-09-17 MED ORDER — BUPIVACAINE LIPOSOME 1.3 % IJ SUSP
INTRAMUSCULAR | Status: DC | PRN
  Administered 2019-09-17: 133 mg via PERINEURAL

## 2019-09-17 MED ORDER — LOSARTAN POTASSIUM 50 MG PO TABS
100.0000 mg | ORAL_TABLET | Freq: Every day | ORAL | Status: DC
  Administered 2019-09-18: 100 mg via ORAL
  Filled 2019-09-17: qty 2

## 2019-09-17 MED ORDER — AMLODIPINE BESYLATE 10 MG PO TABS
10.0000 mg | ORAL_TABLET | Freq: Every day | ORAL | Status: DC
  Administered 2019-09-18: 10 mg via ORAL
  Filled 2019-09-17: qty 1

## 2019-09-17 MED ORDER — METOCLOPRAMIDE HCL 5 MG PO TABS: 5.0000 mg | ORAL_TABLET | Freq: Three times a day (TID) | ORAL | Status: DC | PRN

## 2019-09-17 MED ORDER — NAPROXEN 250 MG PO TABS
500.0000 mg | ORAL_TABLET | Freq: Every day | ORAL | Status: DC | PRN
  Filled 2019-09-17: qty 2

## 2019-09-17 MED ORDER — CEFAZOLIN SODIUM-DEXTROSE 2-4 GM/100ML-% IV SOLN
2.0000 g | Freq: Four times a day (QID) | INTRAVENOUS | Status: AC
  Administered 2019-09-17 – 2019-09-18 (×3): 2 g via INTRAVENOUS
  Filled 2019-09-17 (×3): qty 100

## 2019-09-17 MED ORDER — ASPIRIN EC 81 MG PO TBEC
81.0000 mg | DELAYED_RELEASE_TABLET | Freq: Every day | ORAL | Status: DC
  Administered 2019-09-18: 81 mg via ORAL
  Filled 2019-09-17: qty 1

## 2019-09-17 MED ORDER — DEXAMETHASONE SODIUM PHOSPHATE 10 MG/ML IJ SOLN
INTRAMUSCULAR | Status: AC
  Filled 2019-09-17: qty 1

## 2019-09-17 MED ORDER — SUCCINYLCHOLINE CHLORIDE 200 MG/10ML IV SOSY
PREFILLED_SYRINGE | INTRAVENOUS | Status: AC
  Filled 2019-09-17: qty 10

## 2019-09-17 MED ORDER — LIDOCAINE 2% (20 MG/ML) 5 ML SYRINGE
INTRAMUSCULAR | Status: AC
  Filled 2019-09-17: qty 5

## 2019-09-17 MED ORDER — DOCUSATE SODIUM 100 MG PO CAPS
100.0000 mg | ORAL_CAPSULE | Freq: Two times a day (BID) | ORAL | Status: DC
  Administered 2019-09-17 – 2019-09-18 (×2): 100 mg via ORAL
  Filled 2019-09-17 (×2): qty 1

## 2019-09-17 MED ORDER — PROPOFOL 10 MG/ML IV BOLUS
INTRAVENOUS | Status: DC | PRN
  Administered 2019-09-17: 40 mg via INTRAVENOUS
  Administered 2019-09-17: 140 mg via INTRAVENOUS

## 2019-09-17 MED ORDER — CHLORHEXIDINE GLUCONATE 4 % EX LIQD: 60.0000 mL | Freq: Once | CUTANEOUS | Status: DC

## 2019-09-17 MED ORDER — CHLORTHALIDONE 25 MG PO TABS
25.0000 mg | ORAL_TABLET | Freq: Every morning | ORAL | Status: DC
  Administered 2019-09-18: 25 mg via ORAL
  Filled 2019-09-17: qty 1

## 2019-09-17 MED ORDER — ROCURONIUM BROMIDE 10 MG/ML (PF) SYRINGE
PREFILLED_SYRINGE | INTRAVENOUS | Status: AC
  Filled 2019-09-17: qty 10

## 2019-09-17 MED ORDER — LORATADINE 10 MG PO TABS
10.0000 mg | ORAL_TABLET | Freq: Every day | ORAL | Status: DC
  Administered 2019-09-18: 10 mg via ORAL
  Filled 2019-09-17: qty 1

## 2019-09-17 MED ORDER — EPHEDRINE SULFATE-NACL 50-0.9 MG/10ML-% IV SOSY
PREFILLED_SYRINGE | INTRAVENOUS | Status: DC | PRN
  Administered 2019-09-17: 20 mg via INTRAVENOUS

## 2019-09-17 MED ORDER — METFORMIN HCL 500 MG PO TABS
1000.0000 mg | ORAL_TABLET | Freq: Every day | ORAL | Status: DC
  Administered 2019-09-18: 1000 mg via ORAL
  Filled 2019-09-17: qty 2

## 2019-09-17 MED ORDER — CEFAZOLIN SODIUM-DEXTROSE 2-4 GM/100ML-% IV SOLN
2.0000 g | INTRAVENOUS | Status: AC
  Administered 2019-09-17: 08:00:00 2 g via INTRAVENOUS
  Filled 2019-09-17: qty 100

## 2019-09-17 MED ORDER — EPHEDRINE 5 MG/ML INJ
INTRAVENOUS | Status: AC
  Filled 2019-09-17: qty 10

## 2019-09-17 MED ORDER — BUPIVACAINE HCL (PF) 0.5 % IJ SOLN
INTRAMUSCULAR | Status: DC | PRN
  Administered 2019-09-17: 15 mL via PERINEURAL

## 2019-09-17 MED ORDER — OXYCODONE-ACETAMINOPHEN 5-325 MG PO TABS: 1.0000 | ORAL_TABLET | ORAL | 0 refills | Status: DC | PRN

## 2019-09-17 MED ORDER — NITROGLYCERIN 0.4 MG SL SUBL: 0.4000 mg | SUBLINGUAL_TABLET | SUBLINGUAL | Status: DC | PRN

## 2019-09-17 MED ORDER — ACETAMINOPHEN 500 MG PO TABS: 1000.0000 mg | ORAL_TABLET | Freq: Once | ORAL | Status: DC | PRN

## 2019-09-17 MED ORDER — PHENYLEPHRINE HCL-NACL 10-0.9 MG/250ML-% IV SOLN
INTRAVENOUS | Status: AC
  Filled 2019-09-17: qty 250

## 2019-09-17 MED ORDER — FENTANYL CITRATE (PF) 100 MCG/2ML IJ SOLN: 25.0000 ug | INTRAMUSCULAR | Status: DC | PRN

## 2019-09-17 MED ORDER — INSULIN ASPART 100 UNIT/ML ~~LOC~~ SOLN
6.0000 [IU] | Freq: Three times a day (TID) | SUBCUTANEOUS | Status: DC
  Administered 2019-09-17 – 2019-09-18 (×2): 6 [IU] via SUBCUTANEOUS

## 2019-09-17 MED ORDER — ONDANSETRON HCL 4 MG/2ML IJ SOLN
INTRAMUSCULAR | Status: AC
  Filled 2019-09-17: qty 2

## 2019-09-17 MED ORDER — OXYCODONE HCL 5 MG/5ML PO SOLN: 5.0000 mg | Freq: Once | ORAL | Status: DC | PRN

## 2019-09-17 MED ORDER — MIDAZOLAM HCL 5 MG/5ML IJ SOLN
INTRAMUSCULAR | Status: DC | PRN
  Administered 2019-09-17: 2 mg via INTRAVENOUS

## 2019-09-17 MED ORDER — ONDANSETRON HCL 4 MG PO TABS: 4.0000 mg | ORAL_TABLET | Freq: Four times a day (QID) | ORAL | Status: DC | PRN

## 2019-09-17 MED ORDER — SODIUM CHLORIDE 0.9 % IV SOLN
INTRAVENOUS | Status: DC
  Administered 2019-09-17: 13:00:00 via INTRAVENOUS

## 2019-09-17 SURGICAL SUPPLY — 73 items
AID PSTN UNV HD RSTRNT DISP (MISCELLANEOUS) ×1
BAG ZIPLOCK 12X15 (MISCELLANEOUS) IMPLANT
BIT DRILL 1.6MX128 (BIT) IMPLANT
BIT DRILL 170X2.5X (BIT) ×1 IMPLANT
BIT DRL 170X2.5X (BIT) ×1
BLADE SAG 18X100X1.27 (BLADE) ×2 IMPLANT
COVER BACK TABLE 60X90IN (DRAPES) ×2 IMPLANT
COVER SURGICAL LIGHT HANDLE (MISCELLANEOUS) ×2 IMPLANT
COVER WAND RF STERILE (DRAPES) ×2 IMPLANT
CUP HUMERAL 42 PLUS 3 (Orthopedic Implant) ×2 IMPLANT
DECANTER SPIKE VIAL GLASS SM (MISCELLANEOUS) ×2 IMPLANT
DRAPE INCISE IOBAN 66X45 STRL (DRAPES) ×2 IMPLANT
DRAPE ORTHO SPLIT 77X108 STRL (DRAPES) ×2
DRAPE SHEET LG 3/4 BI-LAMINATE (DRAPES) ×2 IMPLANT
DRAPE SURG ORHT 6 SPLT 77X108 (DRAPES) ×2 IMPLANT
DRAPE U-SHAPE 47X51 STRL (DRAPES) ×2 IMPLANT
DRILL 2.5 (BIT) ×1
DRSG ADAPTIC 3X8 NADH LF (GAUZE/BANDAGES/DRESSINGS) ×2 IMPLANT
DRSG PAD ABDOMINAL 8X10 ST (GAUZE/BANDAGES/DRESSINGS) ×2 IMPLANT
DURAPREP 26ML APPLICATOR (WOUND CARE) ×2 IMPLANT
ELECT BLADE TIP CTD 4 INCH (ELECTRODE) ×2 IMPLANT
ELECT NEEDLE TIP 2.8 STRL (NEEDLE) ×2 IMPLANT
ELECT REM PT RETURN 15FT ADLT (MISCELLANEOUS) ×2 IMPLANT
EPIPHSYSI CENT SZ 2 (Orthopedic Implant) ×2 IMPLANT
EPIPHYSIS CENT SZ 2 (Orthopedic Implant) ×1 IMPLANT
GAUZE SPONGE 4X4 12PLY STRL (GAUZE/BANDAGES/DRESSINGS) ×2 IMPLANT
GLENOSPHERE XTEND RSA 42 SD +2 (Joint) ×2 IMPLANT
GLOVE BIOGEL PI ORTHO PRO 7.5 (GLOVE) ×1
GLOVE BIOGEL PI ORTHO PRO SZ8 (GLOVE) ×1
GLOVE ORTHO TXT STRL SZ7.5 (GLOVE) ×2 IMPLANT
GLOVE PI ORTHO PRO STRL 7.5 (GLOVE) ×1 IMPLANT
GLOVE PI ORTHO PRO STRL SZ8 (GLOVE) ×1 IMPLANT
GLOVE SURG ORTHO 8.5 STRL (GLOVE) ×2 IMPLANT
GOWN STRL REUS W/TWL XL LVL3 (GOWN DISPOSABLE) ×4 IMPLANT
KIT BASIN OR (CUSTOM PROCEDURE TRAY) ×2 IMPLANT
KIT TURNOVER KIT A (KITS) IMPLANT
MANIFOLD NEPTUNE II (INSTRUMENTS) ×2 IMPLANT
METAGLENE DELTA EXTEND (Trauma) ×1 IMPLANT
METAGLENE DXTEND (Trauma) ×2 IMPLANT
MODULAR STEM 14 (Trauma) ×2 IMPLANT
NEEDLE MAYO CATGUT SZ4 (NEEDLE) IMPLANT
NS IRRIG 1000ML POUR BTL (IV SOLUTION) ×2 IMPLANT
PACK SHOULDER (CUSTOM PROCEDURE TRAY) ×2 IMPLANT
PIN GUIDE 1.2 (PIN) ×2 IMPLANT
PIN GUIDE GLENOPHERE 1.5MX300M (PIN) ×2 IMPLANT
PIN METAGLENE 2.5 (PIN) ×2 IMPLANT
PROTECTOR NERVE ULNAR (MISCELLANEOUS) ×2 IMPLANT
RESTRAINT HEAD UNIVERSAL NS (MISCELLANEOUS) ×2 IMPLANT
SCREW 4.5X18MM (Screw) ×1 IMPLANT
SCREW 4.5X24MM (Screw) ×1 IMPLANT
SCREW 4.5X36MM (Screw) ×2 IMPLANT
SCREW 48L (Screw) ×2 IMPLANT
SCREW BN 18X4.5XSTRL SHLDR (Screw) ×1 IMPLANT
SCREW BN 24X4.5XLCK STRL (Screw) ×1 IMPLANT
SLING ARM FOAM STRAP LRG (SOFTGOODS) ×2 IMPLANT
SMARTMIX MINI TOWER (MISCELLANEOUS)
SPONGE LAP 4X18 RFD (DISPOSABLE) IMPLANT
STEM MODULAR 14 (Trauma) ×1 IMPLANT
STRIP CLOSURE SKIN 1/2X4 (GAUZE/BANDAGES/DRESSINGS) ×2 IMPLANT
SUCTION FRAZIER HANDLE 10FR (MISCELLANEOUS) ×1
SUCTION TUBE FRAZIER 10FR DISP (MISCELLANEOUS) ×1 IMPLANT
SUT FIBERWIRE #2 38 T-5 BLUE (SUTURE) ×2
SUT MNCRL AB 4-0 PS2 18 (SUTURE) ×2 IMPLANT
SUT VIC AB 0 CT1 36 (SUTURE) ×4 IMPLANT
SUT VIC AB 0 CT2 27 (SUTURE) ×2 IMPLANT
SUT VIC AB 2-0 CT1 27 (SUTURE) ×1
SUT VIC AB 2-0 CT1 TAPERPNT 27 (SUTURE) ×1 IMPLANT
SUTURE FIBERWR #2 38 T-5 BLUE (SUTURE) ×1 IMPLANT
TAPE CLOTH SURG 6X10 WHT LF (GAUZE/BANDAGES/DRESSINGS) ×2 IMPLANT
TOWEL OR 17X26 10 PK STRL BLUE (TOWEL DISPOSABLE) ×2 IMPLANT
TOWER SMARTMIX MINI (MISCELLANEOUS) IMPLANT
YANKAUER SUCT BULB TIP 10FT TU (MISCELLANEOUS) ×2 IMPLANT
YANKAUER SUCT BULB TIP NO VENT (SUCTIONS) ×2 IMPLANT

## 2019-09-17 NOTE — Brief Op Note (Signed)
09/17/2019  9:47 AM  PATIENT:  Miguel Brock  73 y.o. male  PRE-OPERATIVE DIAGNOSIS:  Left shoulder cuff arthropathy  POST-OPERATIVE DIAGNOSIS:  Left shoulder cuff arthropathy  PROCEDURE:  Procedure(s) with comments: REVERSE TOTAL SHOULDER ARTHROPLASTY (Left) - Interscalene block  SURGEON:  Surgeon(s) and Role:    Netta Cedars, MD - Primary  PHYSICIAN ASSISTANT:   ASSISTANTS: [place] Bruns, PA-C   ANESTHESIA:   regional and general  EBL:  75 mL   BLOOD ADMINISTERED:none  DRAINS: none   LOCAL MEDICATIONS USED:  MARCA[place]     SPECIMEN:  No Specimen  DISPOSITION OF SPECIMEN:  N/A  COUNTS:  YES  TOURNIQUET:  * No tourniquets in log *  DICTATION: .Other Dictation: Dictation Number [id number]  PLAN OF CARE: Admit to inpatient   PATIENT DISPOSITION:  PACU - hemodynamically stable.   Delay start of Pharmacological VTE agent (>24hrs) due to surgical blood loss or risk of bleeding: not applicable

## 2019-09-17 NOTE — Anesthesia Procedure Notes (Signed)
Procedure Name: Intubation Date/Time: 09/17/2019 7:47 AM Performed by: Talbot Grumbling, CRNA Pre-anesthesia Checklist: Patient identified, Emergency Drugs available and Patient being monitored Patient Re-evaluated:Patient Re-evaluated prior to induction Oxygen Delivery Method: Circle system utilized Preoxygenation: Pre-oxygenation with 100% oxygen Induction Type: IV induction Ventilation: Mask ventilation without difficulty Laryngoscope Size: Mac and 3 Grade View: Grade I Tube type: Oral Tube size: 7.5 mm Number of attempts: 1 Airway Equipment and Method: Stylet Placement Confirmation: ETT inserted through vocal cords under direct vision,  positive ETCO2 and breath sounds checked- equal and bilateral Secured at: 23 cm Tube secured with: Tape Dental Injury: Teeth and Oropharynx as per pre-operative assessment

## 2019-09-17 NOTE — Plan of Care (Signed)

## 2019-09-17 NOTE — Discharge Instructions (Signed)
Ice to the shoulder constantly.  Keep the incision covered and clean and dry for one week, then ok to get it wet in the shower. ° °Do exercise as instructed several times per day. ° °DO NOT reach behind your back or push up out of a chair with the operative arm. ° °Use a sling while you are up and around for comfort, may remove while seated.  Keep pillow propped behind the operative elbow. ° °Follow up with Dr Edgar Corrigan in two weeks in the office, call [phone] for appt °

## 2019-09-17 NOTE — Interval H&P Note (Signed)
History and Physical Interval Note:  09/17/2019 7:33 AM  Miguel Brock  has presented today for surgery, with the diagnosis of Left shoulder cuff arthropathy.  The various methods of treatment have been discussed with the patient and family. After consideration of risks, benefits and other options for treatment, the patient has consented to  Procedure(s) with comments: REVERSE TOTAL SHOULDER ARTHROPLASTY (Left) - Interscalene block as a surgical intervention.  The patient's history has been reviewed, patient examined, no change in status, stable for surgery.  I have reviewed the patient's chart and labs.  Questions were answered to the patient's satisfaction.     [name]

## 2019-09-17 NOTE — Transfer of Care (Signed)
Immediate Anesthesia Transfer of Care Note  Patient: MERVYN VANSOMEREN  Procedure(s) Performed: REVERSE TOTAL SHOULDER ARTHROPLASTY (Left Shoulder)  Patient Location: PACU  Anesthesia Type:General  Level of Consciousness: sedated  Airway & Oxygen Therapy: Patient Spontanous Breathing and Patient connected to face mask oxygen  Post-op Assessment: Report given to RN and Post -op Vital signs reviewed and stable  Post vital signs: Reviewed and stable  Last Vitals:  Vitals Value Taken Time  BP 113/66 09/17/19 1002  Temp 37 C 09/17/19 1002  Pulse 56 09/17/19 1004  Resp 18 09/17/19 1004  SpO2 100 % 09/17/19 1004  Vitals shown include unvalidated device data.  Last Pain:  Vitals:   09/17/19 0615  PainSc: 0-No pain         Complications: No apparent anesthesia complications

## 2019-09-17 NOTE — Anesthesia Procedure Notes (Signed)
Anesthesia Regional Block: Interscalene brachial plexus block   Pre-Anesthetic Checklist: ,, timeout performed, Correct Patient, Correct Site, Correct Laterality, Correct Procedure, Correct Position, site marked, Risks and benefits discussed,  Surgical consent,  Pre-op evaluation,  At surgeon's request and post-op pain management  Laterality: Left and Upper  Prep: chloraprep       Needles:  Injection technique: Single-shot     Needle Length: 5cm  Needle Gauge: 22     Additional Needles: Arrow StimuQuik ECHO Echogenic Stimulating PNB Needle  Procedures:,,,, ultrasound used (permanent image in chart),,,,  Narrative:  Start time: 09/17/2019 6:18 AM End time: 09/17/2019 6:23 AM Injection made incrementally with aspirations every 5 mL.  Performed by: Personally  Anesthesiologist: Oleta Mouse, MD

## 2019-09-18 LAB — HEMOGLOBIN AND HEMATOCRIT, BLOOD
HCT: 33.8 % — ABNORMAL LOW (ref 39.0–52.0)
Hemoglobin: 11.5 g/dL — ABNORMAL LOW (ref 13.0–17.0)

## 2019-09-18 LAB — BASIC METABOLIC PANEL
Anion gap: 12 (ref 5–15)
BUN: 25 mg/dL — ABNORMAL HIGH (ref 8–23)
CO2: 22 mmol/L (ref 22–32)
Calcium: 8.5 mg/dL — ABNORMAL LOW (ref 8.9–10.3)
Chloride: 100 mmol/L (ref 98–111)
Creatinine, Ser: 1.42 mg/dL — ABNORMAL HIGH (ref 0.61–1.24)
GFR calc Af Amer: 56 mL/min — ABNORMAL LOW (ref >60)
GFR calc non Af Amer: 48 mL/min — ABNORMAL LOW (ref >60)
Glucose, Bld: 173 mg/dL — ABNORMAL HIGH (ref 70–99)
Potassium: 3.7 mmol/L (ref 3.5–5.1)
Sodium: 134 mmol/L — ABNORMAL LOW (ref 135–145)

## 2019-09-18 LAB — GLUCOSE, CAPILLARY: Glucose-Capillary: 140 mg/dL — ABNORMAL HIGH (ref 70–99)

## 2019-09-18 NOTE — Evaluation (Signed)
Occupational Therapy Evaluation Patient Details Name: Miguel Brock MRN: [id number] DOB: [birth year] Today's Date: 09/18/2019    History of Present Illness s/p L RTSA. PMH:  PTSD, HTN, migraines, DM, CAD and COPD   Clinical Impression   This 73 year old man was admitted for the above surgery. All education was completed. He will follow up with Dr Veverly Fells for any further rehab needs.    Follow Up Recommendations  Follow surgeon's recommendation for DC plan and follow-up therapies    Equipment Recommendations  None recommended by OT    Recommendations for Other Services       Precautions / Restrictions Precautions Precautions: Shoulder Type of Shoulder Precautions: sling on at all times except for bathing/dressing/exercises.  May move elbow wrist and hand.  Gentle hand to face OK Precaution Booklet Issued: Yes (comment) Required Braces or Orthoses: Sling Restrictions Weight Bearing Restrictions: Yes Other Position/Activity Restrictions: LUE NWB      Mobility Bed Mobility               General bed mobility comments: supervision, HOB raised  Transfers Overall transfer level: Modified independent               General transfer comment: extra time    Balance                                           ADL either performed or assessed with clinical judgement   ADL Overall ADL's : Needs assistance/impaired Eating/Feeding: Set up   Grooming: Set up   Upper Body Bathing: Moderate assistance   Lower Body Bathing: Moderate assistance   Upper Body Dressing : Moderate assistance   Lower Body Dressing: Maximal assistance                 General ADL Comments: pt states he has been walking to bathroom on his own and is steady. Educated on shoulder protocol, precautions and allowed distal exercises.     Vision         Perception     Praxis      Pertinent Vitals/Pain Pain Assessment: No/denies pain(thumb starting to wake up)      Hand Dominance Left   Extremity/Trunk Assessment Upper Extremity Assessment Upper Extremity Assessment: LUE deficits/detail(able to move fingers and wrist)           Communication Communication Communication: No difficulties   Cognition Arousal/Alertness: Awake/alert Behavior During Therapy: WFL for tasks assessed/performed Overall Cognitive Status: Within Functional Limits for tasks assessed                                     General Comments       Exercises     Shoulder Instructions      Home Living Family/patient expects to be discharged to:: Private residence Living Arrangements: Spouse/significant other Available Help at Discharge: Family                                    Prior Functioning/Environment Level of Independence: Independent                 OT Problem List:        OT Treatment/Interventions:      OT Goals(Current  goals can be found in the care plan section) Acute Rehab OT Goals Patient Stated Goal: none stated OT Goal Formulation: All assessment and education complete, DC therapy  OT Frequency:     Barriers to D/C:            Co-evaluation              AM-PAC OT "6 Clicks" Daily Activity     Outcome Measure Help from another person eating meals?: A Little Help from another person taking care of personal grooming?: A Little Help from another person toileting, which includes using toliet, bedpan, or urinal?: A Little Help from another person bathing (including washing, rinsing, drying)?: A Lot Help from another person to put on and taking off regular upper body clothing?: A Lot Help from another person to put on and taking off regular lower body clothing?: A Lot 6 Click Score: 15   End of Session    Activity Tolerance: Patient tolerated treatment well Patient left: in bed;with call bell/phone within reach  OT Visit Diagnosis: Muscle weakness (generalized) (M62.81)                Time:  [id number] OT Time Calculation (min): 30 min Charges:  OT General Charges $OT Visit: 1 Visit OT Evaluation $OT Eval Low Complexity: 1 Low OT Treatments $Self Care/Home Management : 8-22 mins  Lesle Chris, OTR/L Acute Rehabilitation Services [phone] [facility] pager [phone] office 09/18/2019  [place] 09/18/2019, 8:34 AM

## 2019-09-18 NOTE — Plan of Care (Signed)
  Problem: Education: Goal: Knowledge of the prescribed therapeutic regimen will improve Outcome: Progressing   Problem: Activity: Goal: Ability to tolerate increased activity will improve Outcome: Progressing   Problem: Pain Management: Goal: Pain level will decrease with appropriate interventions Outcome: Progressing   Problem: Education: Goal: Knowledge of General Education information will improve Description: Including pain rating scale, medication(s)/side effects and non-pharmacologic comfort measures Outcome: Progressing   Problem: Health Behavior/Discharge Planning: Goal: Ability to manage health-related needs will improve Outcome: Progressing

## 2019-09-18 NOTE — Op Note (Signed)
NAME: KAO, MIESSE MEDICAL RECORD [id number] ACCOUNT [id number] DATE OF BIRTH:05-26-1946 FACILITY: [facility] LOCATION: [facility] PHYSICIAN:STEVEN Orlena Sheldon, MD  OPERATIVE REPORT  DATE OF PROCEDURE:  09/17/2019  PREOPERATIVE DIAGNOSES:  Left shoulder end-stage arthritis with rotator cuff insufficiency.  POSTOPERATIVE DIAGNOSES:  Left shoulder end-stage arthritis with rotator cuff insufficiency.  PROCEDURE PERFORMED:  Left reverse total shoulder arthroplasty with subscapularis repair.  ATTENDING SURGEON:  Esmond Plants, MD  ASSISTANT:  Darol Destine, [place], who was scrubbed during the entire procedure and necessary for satisfactory completion of surgery.    ANESTHESIA:  Interscalene block plus general was used.  ESTIMATED BLOOD LOSS:  Less than 100 mL.  FLUID REPLACEMENT:  1500 mL crystalloid.  INSTRUMENT COUNTS:  Correct.  COMPLICATIONS:  No complications.  ANTIBIOTICS:  Perioperative antibiotics were given.  INDICATIONS:  The patient is a 73 year old male who presents with a history of worsening left shoulder pain and dysfunction.  The patient has poor arm elevation less than 90 degrees.  It is beginning to interfere with sleep and ADLs.  Despite  conservative management, the patient has had progressive pain and declining function and desires reverse total shoulder arthroplasty to restore fixed focal mechanics and restore function and eliminate pain to the shoulder.  Informed consent obtained.  DESCRIPTION OF PROCEDURE:  After an adequate level of anesthesia was achieved, the patient was positioned in modified beach chair position.  Left shoulder correctly identified and sterilely prepped and draped in the usual manner.  Time-out called,  verifying correct patient, correct site.  We entered the patient's shoulder using standard deltopectoral incision, started the coracoid process and extended down to the anterior humerus.  Dissection down through the subcutaneous tissues using  Bovie.  We  identified the cephalic vein, took that laterally with the deltoid pectoralis taken medially.  Conjoined tendon identified and retracted medially.  Biceps was tenodesed in situ with 0 Vicryl figure-of-eight suture.  We released the subdeltoid scar  basically, which was starting just posterior to the biceps groove and extending all the way back from the patient's prior remote rotator cuff surgery.  Once that was released, we progressively externally rotated the humerus and delivered the humerus out  of the wound after releasing the subscapularis.  We did tag that for repair at the end with #2 FiberWire suture in a modified Mason-Allen suture technique.  We released the inferior capsule as well.  We had a complete release of the shoulder, which gave  [place] excellent visualization of the humeral head which was devoid of cartilage.  We entered the proximal humerus with a 6 mm reamer, reamed up to a size 14.  We then placed our intramedullary resection guide set on 10 degrees of retroversion.  We resected  the head with an oscillating saw, removing excess osteophytes with a rongeur.  The head was kept for available bone graft for the implantation of the humerus.  We then subluxed the humerus posteriorly and were able to get 360-degree exposure of the  glenoid face.  We removed the hypertrophied capsule which was extremely thick.  We also removed the remnant of the labrum.  All that was taken out.  We were careful to protect the axillary nerve.  We placed our retractors and had good visualization of  the glenoid which was also devoid of cartilage.  We removed the remaining cartilage with a Cobb elevator and a rongeur.  We found our center point for a guide pin and placed that for metaglene baseplate preparation.  Once  we had that pin in place, angled  slightly inferiorly and low on the glenoid, we reamed for the metaglene baseplate down to bleeding bone.  We drilled our central peg hole and impacted the  metaglene baseplate into position.  This was an HA-coated press-fit prosthesis.  We then reduced  the 42 lock screw inferiorly, 36 lock to the base of the coracoid, 24 lock anteriorly, and an 18 nonlock posteriorly, gaining excellent security of the baseplate.  We then selected a 42+2 glenosphere and impacted that in position and screwed that and  were able to get that secure.  I did a finger sweep to make sure there was no soft tissue caught up in that bearing.  Everything was nice and free and clear.  Axillary nerve checked, and it was in good shape.  At this point, we directed our attention  back towards the humeral side.  We completed our humeral preparation with a metaphyseal reamer, and this was a size 2 centered.  Once we had the metaphysis reamed, we inserted the trial prosthesis which was a 14 body and a 2 centered.  Then we placed  that in 10 degrees of retroversion and impacted that in position.  We then selected a 42+3 poly trial and then reduced the shoulder, had nice little pop.  Everything was secure.  No gapping with inferior pole or external rotation and the conjoined  appropriately tight.  We removed the trial components.  We irrigated thoroughly.  We then drilled holes in the lesser tuberosity and placed #2 FiberWire suture for repair of the subscap.  We then used impaction grafting technique with available bone  graft from the humeral head and impacted the HA-coated press-fit stem, 14 body, 2 centered in 10 degrees of retroversion, and it was quite secure and seated properly.  We selected the 42+3 real poly, impacted that on the humeral side, reduced the  shoulder again, nice stability and excellent range of motion.  No binding and no impingement.  We then at this point repaired the subscapularis anatomically back to the lesser tuberosity with sutures through bone and had quite secure repair, and this did not limit motion at all.  We irrigated again and then closed our deltopectoral  interval with 0  Vicryl suture followed by 2-0 Vicryl for subcutaneous closure and 4-0 Monocryl for skin.  Steri-Strips and a sterile dressing and a shoulder sling applied.  The patient was transported to recovery room in stable condition.  LN/NUANCE  D:09/17/2019 T:09/17/2019 JOB:[id number]

## 2019-09-18 NOTE — Plan of Care (Signed)

## 2019-09-18 NOTE — Progress Notes (Signed)
Subjective: 1 Day Post-Op Procedure(s) (LRB): REVERSE TOTAL SHOULDER ARTHROPLASTY (Left) Patient reports pain as mild.   Patient seen in rounds for Dr. Veverly Fells. Patient is well, and has had no acute complaints or problems. No acute events overnight. Patient states he is ready to go home today. Denies chest pain or SHOB. No nausea or vomiting. He is voiding without difficulty and positive flatus. We will continue therapy today.   Objective: Vital signs in last 24 hours: Temp:  [97.6 F (36.4 C)-98.9 F (37.2 C)] 97.6 F (36.4 C) (09/19 0931) Pulse Rate:  [55-69] 69 (09/19 0931) Resp:  [12-16] 16 (09/19 0931) BP: (102-135)/(53-82) 135/82 (09/19 0931) SpO2:  [92 %-100 %] 99 % (09/19 0931)  Intake/Output from previous day:  Intake/Output Summary (Last 24 hours) at 09/18/2019 0931 Last data filed at 09/18/2019 0752 Gross per 24 hour  Intake 3515.54 ml  Output -  Net 3515.54 ml     Intake/Output this shift: Total I/O In: 331.7 [P.O.:240; I.V.:91.7] Out: -   Labs: Recent Labs    09/18/19 0247  HGB 11.5*   Recent Labs    09/18/19 0247  HCT 33.8*   Recent Labs    09/18/19 0247  NA 134*  K 3.7  CL 100  CO2 22  BUN 25*  CREATININE 1.42*  GLUCOSE 173*  CALCIUM 8.5*   No results for input(s): LABPT, INR in the last 72 hours.  Exam: General - Patient is Alert and Appropriate Extremity - Neurologically intact Sensation intact distally Intact pulses distally Dressing - dressing C/D/I Motor Function - intact, moving wrist and fingers well on exam. Intact grip strength.  Past Medical History:  Diagnosis Date  . Alpha galactosidase deficiency   . Anginal pain ([place])    occ  . Arthritis   . Bursitis of left hip   . Carotid stenosis    Stenosis in the right internal carotid artery (50-69%). Stenosis in the right external carotid artery (<50%).  Stenosis in the left external carotid artery (<50%).  Antegrade right vertebral artery flow. Antegrade left vertebral  artery flow.  . Chest pain   . COPD (chronic obstructive pulmonary disease) ([place])   . Coronary artery disease 11,15   stents x7  . Depression   . Diabetes mellitus without complication ([place])    TYPE 2  . Essential (primary) hypertension 03/30/2019  . GERD (gastroesophageal reflux disease)    zantac  . H/O hiatal hernia   . Hepatitis 70's   food   . History of migraine   . Hypertension   . Pneumonia    hx, remote history  . PTSD (post-traumatic stress disorder)   . Trochanteric bursitis of left hip 05/24/2014    Assessment/Plan: 1 Day Post-Op Procedure(s) (LRB): REVERSE TOTAL SHOULDER ARTHROPLASTY (Left) Active Problems:   H/O total shoulder replacement, left  Estimated body mass index is 28.87 kg/m as calculated from the following:   Height as of this encounter: 5\' 11"  (1.803 m).   Weight as of this encounter: 93.9 kg. Advance diet Up with therapy D/C IV fluids  DVT Prophylaxis - Aspirin D/C O2 and pulse ox and try on room air.   Plan is to go Home after hospital stay. Plan for discharge today as long as he continues to do well with therapy and pain is controlled. He reports feeling very well this morning and is anxious to get home. We briefly discussed his operation, but he will talk with Dr. Veverly Fells more at his follow up appointment.  He will follow up in the office in 2 weeks.   Griffith Citron, PA-C Orthopedic Surgery 09/18/2019, 9:31 AM

## 2019-09-20 ENCOUNTER — Encounter (HOSPITAL_COMMUNITY): Payer: Self-pay | Admitting: Orthopedic Surgery

## 2019-09-20 NOTE — Anesthesia Postprocedure Evaluation (Signed)
Anesthesia Post Note  Patient: Miguel Brock  Procedure(s) Performed: REVERSE TOTAL SHOULDER ARTHROPLASTY (Left Shoulder)     Patient location during evaluation: PACU Anesthesia Type: Regional and General Level of consciousness: awake and alert Pain management: pain level controlled Vital Signs Assessment: post-procedure vital signs reviewed and stable Respiratory status: spontaneous breathing, nonlabored ventilation, respiratory function stable and patient connected to nasal cannula oxygen Cardiovascular status: blood pressure returned to baseline and stable Postop Assessment: no apparent nausea or vomiting Anesthetic complications: no    Last Vitals:  Vitals:   09/18/19 0551 09/18/19 0931  BP: 129/67 135/82  Pulse: 60 69  Resp: 16 16  Temp: 36.7 C 36.4 C  SpO2: 94% 99%    Last Pain:  Vitals:   09/18/19 0931  TempSrc: Oral  PainSc:                  [name]

## 2019-09-21 NOTE — Discharge Summary (Signed)
Orthopedic Discharge Summary        Physician Discharge Summary  Patient ID: Miguel Brock MRN: [id number] DOB/AGE: [birth year] 73 y.o.  Admit date: 09/17/2019 Discharge date: 09/18/19   Procedures:  Procedure(s) (LRB): REVERSE TOTAL SHOULDER ARTHROPLASTY (Left)  Attending Physician:  Dr. Esmond Plants  Admission Diagnoses:   Left shoulder cuff arthropathy  Discharge Diagnoses:  Left shoulder cuff arthropathy   Past Medical History:  Diagnosis Date  . Alpha galactosidase deficiency   . Anginal pain ([place])    occ  . Arthritis   . Bursitis of left hip   . Carotid stenosis    Stenosis in the right internal carotid artery (50-69%). Stenosis in the right external carotid artery (<50%).  Stenosis in the left external carotid artery (<50%).  Antegrade right vertebral artery flow. Antegrade left vertebral artery flow.  . Chest pain   . COPD (chronic obstructive pulmonary disease) ([place])   . Coronary artery disease 11,15   stents x7  . Depression   . Diabetes mellitus without complication ([place])    TYPE 2  . Essential (primary) hypertension 03/30/2019  . GERD (gastroesophageal reflux disease)    zantac  . H/O hiatal hernia   . Hepatitis 70's   food   . History of migraine   . Hypertension   . Pneumonia    hx, remote history  . PTSD (post-traumatic stress disorder)   . Trochanteric bursitis of left hip 05/24/2014    PCP: Velna Hatchet, MD   Discharged Condition: good  Hospital Course:  Patient underwent the above stated procedure on 09/17/2019. Patient tolerated the procedure well and brought to the recovery room in good condition and subsequently to the floor. Patient had an uncomplicated hospital course and was stable for discharge.   Disposition:  with follow up in 2 weeks   Follow-up Information    Netta Cedars, MD. Call in 2 weeks.   Specialty: Orthopedic Surgery Why: [phone] Contact information: [address] [place] [address]  [phone]             Allergies as of 09/18/2019      Reactions   Lisinopril Cough   Other Other (See Comments)   Red meat causes occasional tongue swelling, increase in blood pressure (side effect of previous tick bite - alphagal)      Medication List    STOP taking these medications   Anoro Ellipta 62.5-25 MCG/INH Aepb Generic drug: umeclidinium-vilanterol   hydrocortisone 25 MG suppository Commonly known as: ANUSOL-HC     TAKE these medications   Aleve 220 MG tablet Generic drug: naproxen sodium Take 440 mg by mouth at bedtime as needed (pain).   Allergy Relief 10 MG tablet Generic drug: loratadine Take 10 mg by mouth daily.   amLODipine 10 MG tablet Commonly known as: NORVASC Take 1 tablet (10 mg total) by mouth daily.   aspirin EC 81 MG tablet Take 81 mg by mouth daily.   atorvastatin 40 MG tablet Commonly known as: LIPITOR TAKE 1 TABLET DAILY AFTER A MEAL   chlorthalidone 25 MG tablet Commonly known as: HYGROTON TAKE 1 TABLET EVERY MORNING   cyclobenzaprine 10 MG tablet Commonly known as: FLEXERIL Take 10 mg by mouth daily as needed for muscle spasms.   losartan 100 MG tablet Commonly known as: COZAAR TAKE 1 TABLET DAILY   metFORMIN 500 MG tablet Commonly known as: GLUCOPHAGE Take 1 tablet (500 mg total) by mouth 2 (two) times daily with a meal. What  changed: when to take this   metFORMIN 1000 MG tablet Commonly known as: GLUCOPHAGE Take 1,000 mg by mouth daily with breakfast. What changed: Another medication with the same name was changed. Make sure you understand how and when to take each.   nitroGLYCERIN 0.4 MG SL tablet Commonly known as: NITROSTAT Place 1 tablet (0.4 mg total) under the tongue every 5 (five) minutes as needed for chest pain.   oxyCODONE-acetaminophen 5-325 MG tablet Commonly known as: Percocet Take 1 tablet by mouth every 4 (four) hours as needed for severe pain.   ranitidine 150 MG tablet Commonly known as:  ZANTAC Take 150 mg by mouth 2 (two) times daily as needed for heartburn.   Toprol XL 100 MG 24 hr tablet Generic drug: metoprolol succinate Take 100 mg by mouth daily.         Signed: [place]ura Bruns 09/21/2019, 8:14 AM  [facility] is now [employer] [address], [place], [place], [place] [address] Phone: [place]

## 2019-09-23 ENCOUNTER — Ambulatory Visit (INDEPENDENT_AMBULATORY_CARE_PROVIDER_SITE_OTHER): Payer: Medicare Other | Admitting: Cardiology

## 2019-09-23 ENCOUNTER — Encounter: Payer: Self-pay | Admitting: Cardiology

## 2019-09-23 ENCOUNTER — Other Ambulatory Visit: Payer: Self-pay

## 2019-09-23 VITALS — BP 114/60 | HR 58 | Temp 97.3°F | Ht 71.0 in | Wt 206.2 lb

## 2019-09-23 DIAGNOSIS — I2510 Atherosclerotic heart disease of native coronary artery without angina pectoris: Secondary | ICD-10-CM

## 2019-09-23 DIAGNOSIS — R9431 Abnormal electrocardiogram [ECG] [EKG]: Secondary | ICD-10-CM | POA: Diagnosis not present

## 2019-09-23 DIAGNOSIS — R0609 Other forms of dyspnea: Secondary | ICD-10-CM | POA: Diagnosis not present

## 2019-09-23 DIAGNOSIS — R0600 Dyspnea, unspecified: Secondary | ICD-10-CM
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
## 2019-09-23 NOTE — Progress Notes (Deleted)
p 

## 2019-09-24 ENCOUNTER — Other Ambulatory Visit (HOSPITAL_COMMUNITY)
Admission: RE | Admit: 2019-09-24 | Discharge: 2019-09-24 | Disposition: A | Discharge status: Home / Self Care | Payer: Medicare Other | Source: Ambulatory Visit | Attending: Cardiology | Admitting: Cardiology

## 2019-09-24 DIAGNOSIS — Z01812 Encounter for preprocedural laboratory examination: Secondary | ICD-10-CM | POA: Diagnosis not present

## 2019-09-24 DIAGNOSIS — Z20828 Contact with and (suspected) exposure to other viral communicable diseases: Secondary | ICD-10-CM | POA: Diagnosis not present

## 2019-09-25 LAB — NOVEL CORONAVIRUS, NAA (HOSP ORDER, SEND-OUT TO REF LAB; TAT 18-24 HRS): SARS-CoV-2, NAA: NOT DETECTED

## 2019-09-27 ENCOUNTER — Ambulatory Visit: Payer: Medicare Other | Admitting: Cardiology

## 2019-09-28 ENCOUNTER — Ambulatory Visit (HOSPITAL_COMMUNITY)
Admission: RE | Admit: 2019-09-28 | Discharge: 2019-09-28 | Disposition: A | Discharge status: Home / Self Care | Payer: Medicare Other | Attending: Cardiology | Admitting: Cardiology

## 2019-09-28 ENCOUNTER — Encounter (HOSPITAL_COMMUNITY)
Admission: RE | Disposition: A | Discharge status: Home / Self Care | Payer: Self-pay | Source: Home / Self Care | Attending: Cardiology

## 2019-09-28 ENCOUNTER — Other Ambulatory Visit: Payer: Self-pay

## 2019-09-28 DIAGNOSIS — Z7984 Long term (current) use of oral hypoglycemic drugs: Secondary | ICD-10-CM | POA: Insufficient documentation

## 2019-09-28 DIAGNOSIS — M1990 Unspecified osteoarthritis, unspecified site: Secondary | ICD-10-CM | POA: Diagnosis not present

## 2019-09-28 DIAGNOSIS — Z87891 Personal history of nicotine dependence: Secondary | ICD-10-CM | POA: Insufficient documentation

## 2019-09-28 DIAGNOSIS — Z7982 Long term (current) use of aspirin: Secondary | ICD-10-CM | POA: Insufficient documentation

## 2019-09-28 DIAGNOSIS — Z6828 Body mass index (BMI) 28.0-28.9, adult: Secondary | ICD-10-CM | POA: Diagnosis not present

## 2019-09-28 DIAGNOSIS — J449 Chronic obstructive pulmonary disease, unspecified: Secondary | ICD-10-CM | POA: Insufficient documentation

## 2019-09-28 DIAGNOSIS — E119 Type 2 diabetes mellitus without complications: Secondary | ICD-10-CM | POA: Diagnosis not present

## 2019-09-28 DIAGNOSIS — Z79899 Other long term (current) drug therapy: Secondary | ICD-10-CM | POA: Diagnosis not present

## 2019-09-28 DIAGNOSIS — F329 Major depressive disorder, single episode, unspecified: Secondary | ICD-10-CM | POA: Insufficient documentation

## 2019-09-28 DIAGNOSIS — I2510 Atherosclerotic heart disease of native coronary artery without angina pectoris: Secondary | ICD-10-CM | POA: Diagnosis present

## 2019-09-28 DIAGNOSIS — I25708 Atherosclerosis of coronary artery bypass graft(s), unspecified, with other forms of angina pectoris: Secondary | ICD-10-CM

## 2019-09-28 DIAGNOSIS — R0602 Shortness of breath: Secondary | ICD-10-CM | POA: Diagnosis present

## 2019-09-28 DIAGNOSIS — K219 Gastro-esophageal reflux disease without esophagitis: Secondary | ICD-10-CM | POA: Insufficient documentation

## 2019-09-28 DIAGNOSIS — E669 Obesity, unspecified: Secondary | ICD-10-CM | POA: Diagnosis not present

## 2019-09-28 DIAGNOSIS — I6523 Occlusion and stenosis of bilateral carotid arteries: Secondary | ICD-10-CM | POA: Diagnosis not present

## 2019-09-28 DIAGNOSIS — I10 Essential (primary) hypertension: Secondary | ICD-10-CM | POA: Diagnosis not present

## 2019-09-28 HISTORY — PX: LEFT HEART CATH AND CORONARY ANGIOGRAPHY: CATH118249

## 2019-09-28 LAB — GLUCOSE, CAPILLARY
Glucose-Capillary: 115 mg/dL — ABNORMAL HIGH (ref 70–99)
Glucose-Capillary: 90 mg/dL (ref 70–99)

## 2019-09-28 SURGERY — LEFT HEART CATH AND CORONARY ANGIOGRAPHY
Anesthesia: LOCAL

## 2019-09-28 MED ORDER — MIDAZOLAM HCL 2 MG/2ML IJ SOLN
INTRAMUSCULAR | Status: DC | PRN
  Administered 2019-09-28: 1 mg via INTRAVENOUS

## 2019-09-28 MED ORDER — HEPARIN (PORCINE) IN NACL 1000-0.9 UT/500ML-% IV SOLN
INTRAVENOUS | Status: AC
  Filled 2019-09-28: qty 1000

## 2019-09-28 MED ORDER — ONDANSETRON HCL 4 MG/2ML IJ SOLN: 4.0000 mg | Freq: Four times a day (QID) | INTRAMUSCULAR | Status: DC | PRN

## 2019-09-28 MED ORDER — LABETALOL HCL 5 MG/ML IV SOLN: 10.0000 mg | INTRAVENOUS | Status: DC | PRN

## 2019-09-28 MED ORDER — SODIUM CHLORIDE 0.9% FLUSH: 3.0000 mL | INTRAVENOUS | Status: DC | PRN

## 2019-09-28 MED ORDER — NITROGLYCERIN 1 MG/10 ML FOR IR/CATH LAB
INTRA_ARTERIAL | Status: AC
  Filled 2019-09-28: qty 10

## 2019-09-28 MED ORDER — LIDOCAINE HCL (PF) 1 % IJ SOLN
INTRAMUSCULAR | Status: DC | PRN
  Administered 2019-09-28: 2 mL

## 2019-09-28 MED ORDER — SODIUM CHLORIDE 0.9 % WEIGHT BASED INFUSION
3.0000 mL/kg/h | INTRAVENOUS | Status: AC
  Administered 2019-09-28: 3 mL/kg/h via INTRAVENOUS

## 2019-09-28 MED ORDER — VERAPAMIL HCL 2.5 MG/ML IV SOLN
INTRA_ARTERIAL | Status: DC | PRN
  Administered 2019-09-28: 13:00:00 15 mL via INTRA_ARTERIAL

## 2019-09-28 MED ORDER — ACETAMINOPHEN 325 MG PO TABS: 650.0000 mg | ORAL_TABLET | ORAL | Status: DC | PRN

## 2019-09-28 MED ORDER — HYDRALAZINE HCL 20 MG/ML IJ SOLN: 10.0000 mg | INTRAMUSCULAR | Status: DC | PRN

## 2019-09-28 MED ORDER — MIDAZOLAM HCL 2 MG/2ML IJ SOLN
INTRAMUSCULAR | Status: AC
  Filled 2019-09-28: qty 2

## 2019-09-28 MED ORDER — LIDOCAINE HCL (PF) 1 % IJ SOLN
INTRAMUSCULAR | Status: AC
  Filled 2019-09-28: qty 30

## 2019-09-28 MED ORDER — SODIUM CHLORIDE 0.9 % IV SOLN: INTRAVENOUS | Status: DC

## 2019-09-28 MED ORDER — HEPARIN SODIUM (PORCINE) 1000 UNIT/ML IJ SOLN
INTRAMUSCULAR | Status: DC | PRN
  Administered 2019-09-28: 4500 [IU] via INTRAVENOUS

## 2019-09-28 MED ORDER — SODIUM CHLORIDE 0.9% FLUSH: 3.0000 mL | Freq: Two times a day (BID) | INTRAVENOUS | Status: DC

## 2019-09-28 MED ORDER — ASPIRIN 81 MG PO CHEW: 81.0000 mg | CHEWABLE_TABLET | ORAL | Status: DC

## 2019-09-28 MED ORDER — FENTANYL CITRATE (PF) 100 MCG/2ML IJ SOLN
INTRAMUSCULAR | Status: AC
  Filled 2019-09-28: qty 2

## 2019-09-28 MED ORDER — SODIUM CHLORIDE 0.9 % IV SOLN
INTRAVENOUS | Status: AC | PRN
  Administered 2019-09-28: 500 mL via INTRAVENOUS

## 2019-09-28 MED ORDER — HEPARIN (PORCINE) IN NACL 1000-0.9 UT/500ML-% IV SOLN
INTRAVENOUS | Status: DC | PRN
  Administered 2019-09-28 (×2): 500 mL

## 2019-09-28 MED ORDER — HEPARIN SODIUM (PORCINE) 1000 UNIT/ML IJ SOLN
INTRAMUSCULAR | Status: AC
  Filled 2019-09-28: qty 1

## 2019-09-28 MED ORDER — IOHEXOL 350 MG/ML SOLN
INTRAVENOUS | Status: DC | PRN
  Administered 2019-09-28: 65 mL via INTRACARDIAC

## 2019-09-28 MED ORDER — VERAPAMIL HCL 2.5 MG/ML IV SOLN
INTRAVENOUS | Status: AC
  Filled 2019-09-28: qty 2

## 2019-09-28 MED ORDER — SODIUM CHLORIDE 0.9 % IV SOLN: 250.0000 mL | INTRAVENOUS | Status: DC | PRN

## 2019-09-28 MED ORDER — SODIUM CHLORIDE 0.9 % WEIGHT BASED INFUSION: 1.0000 mL/kg/h | INTRAVENOUS | Status: DC

## 2019-09-28 MED ORDER — FENTANYL CITRATE (PF) 100 MCG/2ML IJ SOLN
INTRAMUSCULAR | Status: DC | PRN
  Administered 2019-09-28: 25 ug via INTRAVENOUS

## 2019-09-28 SURGICAL SUPPLY — 13 items
CATH 5FR JL3.5 JR4 ANG PIG MP (CATHETERS) ×2 IMPLANT
CATH INFINITI 5 FR 3DRC (CATHETERS) ×2 IMPLANT
CATH INFINITI 5 FR AR1 MOD (CATHETERS) ×2 IMPLANT
CATH INFINITI 5FR AL1 (CATHETERS) ×2 IMPLANT
DEVICE RAD COMP TR BAND LRG (VASCULAR PRODUCTS) ×2 IMPLANT
ELECT DEFIB PAD ADLT CADENCE (PAD) ×2 IMPLANT
GLIDESHEATH SLEND A-KIT 6F 22G (SHEATH) ×2 IMPLANT
GUIDEWIRE INQWIRE 1.5J.035X260 (WIRE) ×1 IMPLANT
INQWIRE 1.5J .035X260CM (WIRE) ×2
KIT HEART LEFT (KITS) ×2 IMPLANT
PACK CARDIAC CATHETERIZATION (CUSTOM PROCEDURE TRAY) ×2 IMPLANT
TRANSDUCER W/STOPCOCK (MISCELLANEOUS) ×2 IMPLANT
TUBING CIL FLEX 10 FLL-RA (TUBING) ×2 IMPLANT

## 2019-09-28 NOTE — Progress Notes (Signed)
Spoke with Anderson Malta about Increased BUN/ Creat.  Per Anderson Malta we do not need to repeat labs.

## 2019-09-28 NOTE — Discharge Instructions (Signed)
KEEP RIGHT ARM ELEVATED ABOVE LEVEL OF HEART HOLD METFORMIN X 48 HOURS (RESUME Friday)   Moderate Conscious Sedation, Adult, Care After These instructions provide you with information about caring for yourself after your procedure. Your health care provider may also give you more specific instructions. Your treatment has been planned according to current medical practices, but problems sometimes occur. Call your health care provider if you have any problems or questions after your procedure. What can I expect after the procedure? After your procedure, it is common:  To feel sleepy for several hours.  To feel clumsy and have poor balance for several hours.  To have poor judgment for several hours.  To vomit if you eat too soon. Follow these instructions at home: For at least 24 hours after the procedure:   Do not: ? Participate in activities where you could fall or become injured. ? Drive. ? Use heavy machinery. ? Drink alcohol. ? Take sleeping pills or medicines that cause drowsiness. ? Make important decisions or sign legal documents. ? Take care of children on your own.  Rest. Eating and drinking  Follow the diet recommended by your health care provider.  If you vomit: ? Drink water, juice, or soup when you can drink without vomiting. ? Make sure you have little or no nausea before eating solid foods. General instructions  Have a responsible adult stay with you until you are awake and alert.  Take over-the-counter and prescription medicines only as told by your health care provider.  If you smoke, do not smoke without supervision.  Keep all follow-up visits as told by your health care provider. This is important. Contact a health care provider if:  You keep feeling nauseous or you keep vomiting.  You feel light-headed.  You develop a rash.  You have a fever. Get help right away if:  You have trouble breathing. This information is not intended to replace  advice given to you by your health care provider. Make sure you discuss any questions you have with your health care provider. Document Released: 10/06/2013 Document Revised: 11/28/2017 Document Reviewed: 04/06/2016 Elsevier Patient Education  2020 [employer]  This sheet gives you information about how to care for yourself after your procedure. Your health care provider may also give you more specific instructions. If you have problems or questions, contact your health care provider. What can I expect after the procedure? After the procedure, it is common to have:  Bruising and tenderness at the catheter insertion area. Follow these instructions at home: Medicines  Take over-the-counter and prescription medicines only as told by your health care provider. Insertion site care  Follow instructions from your health care provider about how to take care of your insertion site. Make sure you: ? Wash your hands with soap and water before you change your bandage (dressing). If soap and water are not available, use hand sanitizer. ? Change your dressing as told by your health care provider. ? Leave stitches (sutures), skin glue, or adhesive strips in place. These skin closures may need to stay in place for 2 weeks or longer. If adhesive strip edges start to loosen and curl up, you may trim the loose edges. Do not remove adhesive strips completely unless your health care provider tells you to do that.  Check your insertion site every day for signs of infection. Check for: ? Redness, swelling, or pain. ? Fluid or blood. ? Pus or a bad smell. ? Warmth.  Do  not take baths, swim, or use a hot tub until your health care provider approves.  You may shower 24-48 hours after the procedure, or as directed by your health care provider. ? Remove the dressing and gently wash the site with plain soap and water. ? Pat the area dry with a clean towel. ? Do not rub the site. That could  cause bleeding.  Do not apply powder or lotion to the site. Activity   For 24 hours after the procedure, or as directed by your health care provider: ? Do not flex or bend the affected arm. ? Do not push or pull heavy objects with the affected arm. ? Do not drive yourself home from the hospital or clinic. You may drive 24 hours after the procedure unless your health care provider tells you not to. ? Do not operate machinery or power tools.  Do not lift anything that is heavier than 10 lb (4.5 kg), or the limit that you are told, until your health care provider says that it is safe.  Ask your health care provider when it is okay to: ? Return to work or school. ? Resume usual physical activities or sports. ? Resume sexual activity. General instructions  If the catheter site starts to bleed, raise your arm and put firm pressure on the site. If the bleeding does not stop, get help right away. This is a medical emergency.  If you went home on the same day as your procedure, a responsible adult should be with you for the first 24 hours after you arrive home.  Keep all follow-up visits as told by your health care provider. This is important. Contact a health care provider if:  You have a fever.  You have redness, swelling, or yellow drainage around your insertion site. Get help right away if:  You have unusual pain at the radial site.  The catheter insertion area swells very fast.  The insertion area is bleeding, and the bleeding does not stop when you hold steady pressure on the area.  Your arm or hand becomes pale, cool, tingly, or numb. These symptoms may represent a serious problem that is an emergency. Do not wait to see if the symptoms will go away. Get medical help right away. Call your local emergency services (911 in the [place] Do not drive yourself to the hospital. Summary  After the procedure, it is common to have bruising and tenderness at the site.  Follow  instructions from your health care provider about how to take care of your radial site wound. Check the wound every day for signs of infection.  Do not lift anything that is heavier than 10 lb (4.5 kg), or the limit that you are told, until your health care provider says that it is safe. This information is not intended to replace advice given to you by your health care provider. Make sure you discuss any questions you have with your health care provider. Document Released: 01/18/2011 Document Revised: 01/21/2018 Document Reviewed: 01/21/2018 Elsevier Patient Education  2020 [employer].

## 2019-09-28 NOTE — Interval H&P Note (Signed)
History and Physical Interval Note:  09/28/2019 12:40 PM  Miguel Brock  has presented today for surgery, with the diagnosis of CAD.  The various methods of treatment have been discussed with the patient and family. After consideration of risks, benefits and other options for treatment, the patient has consented to  Procedure(s): LEFT HEART CATH AND CORONARY ANGIOGRAPHY (N/A) as a surgical intervention.  The patient's history has been reviewed, patient examined, no change in status, stable for surgery.  I have reviewed the patient's chart and labs.  Questions were answered to the patient's satisfaction.     2016 Appropriate Use Criteria for Coronary Revascularization in Patients With Acute Coronary Syndrome NSTEMI/UA Intermediate Risk (TIMI Score 3-4) NSTEMI/Unstable angina, stabilized patient at Intermediate Risk (TIMI Score 3-4) Link Here: http://dodson-rose.net/ Indication:  Revascularization by PCI or CABG of 1 or more arteries in a patient with NSTEMI or unstable angina with Stabilization after presentation Intermediate risk for clinical events  A (7) Indication: 16; Score 7 [place]

## 2019-09-29 ENCOUNTER — Encounter (HOSPITAL_COMMUNITY): Payer: Self-pay | Admitting: Cardiology

## 2019-09-30 DIAGNOSIS — Z5189 Encounter for other specified aftercare: Secondary | ICD-10-CM | POA: Diagnosis not present

## 2019-10-14 ENCOUNTER — Ambulatory Visit: Payer: Medicare Other | Admitting: Cardiology

## 2019-10-28 DIAGNOSIS — Z4789 Encounter for other orthopedic aftercare: Secondary | ICD-10-CM | POA: Diagnosis not present

## 2019-11-17 ENCOUNTER — Ambulatory Visit: Payer: Medicare Other | Admitting: Cardiology

## 2019-11-30 ENCOUNTER — Encounter: Payer: Self-pay | Admitting: Cardiology

## 2019-11-30 ENCOUNTER — Ambulatory Visit (INDEPENDENT_AMBULATORY_CARE_PROVIDER_SITE_OTHER): Payer: Medicare Other | Admitting: Cardiology

## 2019-11-30 ENCOUNTER — Other Ambulatory Visit: Payer: Self-pay

## 2019-11-30 VITALS — BP 132/61 | HR 60 | Ht 71.0 in | Wt 204.0 lb

## 2019-11-30 DIAGNOSIS — I2510 Atherosclerotic heart disease of native coronary artery without angina pectoris: Secondary | ICD-10-CM | POA: Diagnosis not present

## 2019-11-30 DIAGNOSIS — R0600 Dyspnea, unspecified: Secondary | ICD-10-CM

## 2019-11-30 DIAGNOSIS — I6523 Occlusion and stenosis of bilateral carotid arteries: Secondary | ICD-10-CM

## 2019-11-30 DIAGNOSIS — I10 Essential (primary) hypertension: Secondary | ICD-10-CM | POA: Diagnosis not present

## 2019-11-30 DIAGNOSIS — E782 Mixed hyperlipidemia: Secondary | ICD-10-CM

## 2019-11-30 DIAGNOSIS — K219 Gastro-esophageal reflux disease without esophagitis: Secondary | ICD-10-CM | POA: Insufficient documentation

## 2019-11-30 DIAGNOSIS — E118 Type 2 diabetes mellitus with unspecified complications: Secondary | ICD-10-CM | POA: Diagnosis not present

## 2019-11-30 DIAGNOSIS — R0609 Other forms of dyspnea: Secondary | ICD-10-CM
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
## 2019-12-09 ENCOUNTER — Ambulatory Visit: Payer: Medicare Other | Admitting: Cardiology

## 2019-12-09 DIAGNOSIS — H524 Presbyopia: Secondary | ICD-10-CM | POA: Diagnosis not present

## 2019-12-09 DIAGNOSIS — H353111 Nonexudative age-related macular degeneration, right eye, early dry stage: Secondary | ICD-10-CM | POA: Diagnosis not present

## 2019-12-09 DIAGNOSIS — Z4789 Encounter for other orthopedic aftercare: Secondary | ICD-10-CM | POA: Diagnosis not present

## 2019-12-09 DIAGNOSIS — H52223 Regular astigmatism, bilateral: Secondary | ICD-10-CM | POA: Diagnosis not present

## 2019-12-09 DIAGNOSIS — H0100A Unspecified blepharitis right eye, upper and lower eyelids: Secondary | ICD-10-CM | POA: Diagnosis not present

## 2019-12-09 DIAGNOSIS — H04123 Dry eye syndrome of bilateral lacrimal glands: Secondary | ICD-10-CM | POA: Diagnosis not present

## 2019-12-09 DIAGNOSIS — E119 Type 2 diabetes mellitus without complications: Secondary | ICD-10-CM | POA: Diagnosis not present

## 2019-12-09 DIAGNOSIS — H5203 Hypermetropia, bilateral: Secondary | ICD-10-CM | POA: Diagnosis not present

## 2019-12-09 DIAGNOSIS — H2513 Age-related nuclear cataract, bilateral: Secondary | ICD-10-CM | POA: Diagnosis not present

## 2020-01-05 ENCOUNTER — Other Ambulatory Visit: Payer: Medicare Other

## 2020-01-07 ENCOUNTER — Other Ambulatory Visit: Payer: Medicare Other

## 2020-01-20 DIAGNOSIS — M25511 Pain in right shoulder: Secondary | ICD-10-CM | POA: Diagnosis not present

## 2020-01-20 DIAGNOSIS — M19011 Primary osteoarthritis, right shoulder: Secondary | ICD-10-CM | POA: Diagnosis not present

## 2020-01-21 ENCOUNTER — Ambulatory Visit (INDEPENDENT_AMBULATORY_CARE_PROVIDER_SITE_OTHER): Payer: Medicare Other

## 2020-01-21 ENCOUNTER — Ambulatory Visit: Payer: Medicare Other | Admitting: Cardiology

## 2020-01-21 ENCOUNTER — Other Ambulatory Visit: Payer: Self-pay

## 2020-01-21 DIAGNOSIS — I6521 Occlusion and stenosis of right carotid artery: Secondary | ICD-10-CM | POA: Diagnosis not present

## 2020-01-23 ENCOUNTER — Other Ambulatory Visit: Payer: Self-pay | Admitting: Cardiology

## 2020-01-23 DIAGNOSIS — I6521 Occlusion and stenosis of right carotid artery: Secondary | ICD-10-CM

## 2020-01-24 DIAGNOSIS — L309 Dermatitis, unspecified: Secondary | ICD-10-CM | POA: Diagnosis not present

## 2020-01-24 DIAGNOSIS — L570 Actinic keratosis: Secondary | ICD-10-CM | POA: Diagnosis not present

## 2020-01-25 NOTE — Progress Notes (Signed)
No change in 50-60% right carotid stenosis, recheck in 6 months

## 2020-01-25 NOTE — Progress Notes (Signed)
Pt aware.

## 2020-01-25 NOTE — Progress Notes (Signed)
Called pt no answer, left a vm to return call back.

## 2020-01-31 ENCOUNTER — Other Ambulatory Visit: Payer: Self-pay

## 2020-01-31 ENCOUNTER — Ambulatory Visit (INDEPENDENT_AMBULATORY_CARE_PROVIDER_SITE_OTHER): Payer: Medicare Other | Admitting: Cardiology

## 2020-01-31 ENCOUNTER — Encounter: Payer: Self-pay | Admitting: Cardiology

## 2020-01-31 ENCOUNTER — Ambulatory Visit: Payer: Medicare Other | Admitting: Cardiology

## 2020-01-31 VITALS — BP 135/62 | HR 56 | Temp 97.2°F | Ht 71.0 in | Wt 202.0 lb

## 2020-01-31 DIAGNOSIS — I6521 Occlusion and stenosis of right carotid artery: Secondary | ICD-10-CM

## 2020-01-31 DIAGNOSIS — E782 Mixed hyperlipidemia: Secondary | ICD-10-CM

## 2020-01-31 DIAGNOSIS — I10 Essential (primary) hypertension: Secondary | ICD-10-CM

## 2020-01-31 DIAGNOSIS — I2510 Atherosclerotic heart disease of native coronary artery without angina pectoris: Secondary | ICD-10-CM
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
## 2020-02-17 ENCOUNTER — Other Ambulatory Visit: Payer: Self-pay | Admitting: Cardiology

## 2020-02-17 DIAGNOSIS — I10 Essential (primary) hypertension: Secondary | ICD-10-CM

## 2020-03-06 DIAGNOSIS — L570 Actinic keratosis: Secondary | ICD-10-CM | POA: Diagnosis not present

## 2020-03-06 DIAGNOSIS — D485 Neoplasm of uncertain behavior of skin: Secondary | ICD-10-CM | POA: Diagnosis not present

## 2020-03-06 DIAGNOSIS — L309 Dermatitis, unspecified: Secondary | ICD-10-CM | POA: Diagnosis not present

## 2020-03-06 DIAGNOSIS — L821 Other seborrheic keratosis: Secondary | ICD-10-CM | POA: Diagnosis not present

## 2020-04-18 DIAGNOSIS — M25511 Pain in right shoulder: Secondary | ICD-10-CM | POA: Diagnosis not present

## 2020-04-24 DIAGNOSIS — M9904 Segmental and somatic dysfunction of sacral region: Secondary | ICD-10-CM | POA: Diagnosis not present

## 2020-04-24 DIAGNOSIS — E785 Hyperlipidemia, unspecified: Secondary | ICD-10-CM | POA: Insufficient documentation

## 2020-04-24 DIAGNOSIS — M545 Low back pain: Secondary | ICD-10-CM | POA: Diagnosis not present

## 2020-04-29 DIAGNOSIS — M25511 Pain in right shoulder: Secondary | ICD-10-CM | POA: Diagnosis not present

## 2020-05-16 DIAGNOSIS — M75101 Unspecified rotator cuff tear or rupture of right shoulder, not specified as traumatic: Secondary | ICD-10-CM | POA: Diagnosis not present

## 2020-05-16 DIAGNOSIS — S43431D Superior glenoid labrum lesion of right shoulder, subsequent encounter: Secondary | ICD-10-CM | POA: Diagnosis not present

## 2020-05-16 DIAGNOSIS — M25511 Pain in right shoulder: Secondary | ICD-10-CM | POA: Diagnosis not present

## 2020-05-17 DIAGNOSIS — M9904 Segmental and somatic dysfunction of sacral region: Secondary | ICD-10-CM | POA: Diagnosis not present

## 2020-05-18 ENCOUNTER — Encounter: Payer: Self-pay | Admitting: Cardiology

## 2020-05-26 DIAGNOSIS — E785 Hyperlipidemia, unspecified: Secondary | ICD-10-CM | POA: Diagnosis not present

## 2020-05-26 DIAGNOSIS — Z01818 Encounter for other preprocedural examination: Secondary | ICD-10-CM | POA: Diagnosis not present

## 2020-05-26 DIAGNOSIS — J449 Chronic obstructive pulmonary disease, unspecified: Secondary | ICD-10-CM | POA: Diagnosis not present

## 2020-05-26 DIAGNOSIS — Z87891 Personal history of nicotine dependence: Secondary | ICD-10-CM | POA: Diagnosis not present

## 2020-05-26 DIAGNOSIS — I129 Hypertensive chronic kidney disease with stage 1 through stage 4 chronic kidney disease, or unspecified chronic kidney disease: Secondary | ICD-10-CM | POA: Diagnosis not present

## 2020-05-26 DIAGNOSIS — I25118 Atherosclerotic heart disease of native coronary artery with other forms of angina pectoris: Secondary | ICD-10-CM | POA: Diagnosis not present

## 2020-05-26 DIAGNOSIS — N1831 Chronic kidney disease, stage 3a: Secondary | ICD-10-CM | POA: Diagnosis not present

## 2020-05-26 DIAGNOSIS — I6529 Occlusion and stenosis of unspecified carotid artery: Secondary | ICD-10-CM | POA: Diagnosis not present

## 2020-05-26 DIAGNOSIS — E1169 Type 2 diabetes mellitus with other specified complication: Secondary | ICD-10-CM | POA: Diagnosis not present

## 2020-06-06 ENCOUNTER — Other Ambulatory Visit: Payer: Self-pay | Admitting: Cardiology

## 2020-06-06 ENCOUNTER — Other Ambulatory Visit: Payer: Self-pay

## 2020-06-06 MED ORDER — CHLORTHALIDONE 25 MG PO TABS: 25.0000 mg | ORAL_TABLET | Freq: Every morning | ORAL | 0 refills | Status: DC

## 2020-06-07 ENCOUNTER — Other Ambulatory Visit: Payer: Self-pay

## 2020-06-07 MED ORDER — METOPROLOL SUCCINATE ER 100 MG PO TB24: 100.0000 mg | ORAL_TABLET | Freq: Every day | ORAL | 3 refills | Status: DC

## 2020-06-07 MED ORDER — METOPROLOL SUCCINATE ER 100 MG PO TB24: 100.0000 mg | ORAL_TABLET | Freq: Every day | ORAL | 0 refills | Status: DC

## 2020-06-13 DIAGNOSIS — M9904 Segmental and somatic dysfunction of sacral region: Secondary | ICD-10-CM | POA: Diagnosis not present

## 2020-07-06 ENCOUNTER — Other Ambulatory Visit: Payer: Medicare Other

## 2020-07-10 ENCOUNTER — Ambulatory Visit: Payer: Medicare Other

## 2020-07-10 ENCOUNTER — Other Ambulatory Visit: Payer: Self-pay

## 2020-07-10 DIAGNOSIS — I6521 Occlusion and stenosis of right carotid artery: Secondary | ICD-10-CM | POA: Diagnosis not present
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
## 2020-07-16 ENCOUNTER — Other Ambulatory Visit: Payer: Self-pay | Admitting: Cardiology

## 2020-07-16 DIAGNOSIS — I6523 Occlusion and stenosis of bilateral carotid arteries: Secondary | ICD-10-CM

## 2020-07-18 NOTE — Patient Instructions (Addendum)
DUE TO COVID-19 ONLY ONE VISITOR IS ALLOWED TO COME WITH YOU AND STAY IN THE WAITING ROOM ONLY DURING PRE OP AND PROCEDURE DAY OF SURGERY. THE 2 VISITORS  MAY VISIT WITH YOU AFTER SURGERY IN YOUR PRIVATE ROOM DURING VISITING HOURS ONLY!  YOU NEED TO HAVE A COVID 19 TEST ON_7/26/21______ @_2 :15______, THIS TEST MUST BE DONE BEFORE SURGERY, COME  [place] , [id number].  ([place]) ONCE YOUR COVID TEST IS COMPLETED, PLEASE BEGIN THE QUARANTINE INSTRUCTIONS AS OUTLINED IN YOUR HANDOUT.                Miguel Brock    Your procedure is scheduled on: 07/28/20   Report to [facility] Main  Entrance   Report to admitting at  10:30 AM     Call this number if you have problems the morning of surgery [place], NO [place].   Do not eat food After Midnight.   YOU MAY HAVE CLEAR LIQUIDS FROM MIDNIGHT UNTIL 9:30 AM  . At 9:30 AM Please finish the prescribed Pre-Surgery Gatorade drink.   Nothing by mouth after you finish the Gatorade drink !   Take these medicines the morning of surgery with A SIP OF WATER: Zyrtec, Metoprolol, Amlodipine, Prilosec  DO NOT TAKE ANY DIABETIC MEDICATIONS DAY OF YOUR SURGERY   How to Manage Your Diabetes Before and After Surgery  Why is it important to control my blood sugar before and after surgery? . Improving blood sugar levels before and after surgery helps healing and can limit problems. . A way of improving blood sugar control is eating a healthy diet by: o  Eating less sugar and carbohydrates o  Increasing activity/exercise o  Talking with your doctor about reaching your blood sugar goals . High blood sugars (greater than 180 mg/dL) can raise your risk of infections and slow your recovery, so you will need to focus on controlling your diabetes during the weeks before surgery. . Make sure that the doctor who takes care of  your diabetes knows about your planned surgery including the date and location.  How do I manage my blood sugar before surgery? . Check your blood sugar at least 4 times a day, starting 2 days before surgery, to make sure that the level is not too high or low. o Check your blood sugar the morning of your surgery when you wake up and every 2 hours until you get to the Short Stay unit. . If your blood sugar is less than 70 mg/dL, you will need to treat for low blood sugar: o Do not take insulin. o Treat a low blood sugar (less than 70 mg/dL) with  cup of clear juice (cranberry or apple), 4 glucose tablets, OR glucose gel. o Recheck blood sugar in 15 minutes after treatment (to make sure it is greater than 70 mg/dL). If your blood sugar is not greater than 70 mg/dL on recheck, call [phone] for further instructions. . Report your blood sugar to the short stay nurse when you get to Short Stay.  . If you are admitted to the hospital after surgery: o Your blood sugar will be checked by the staff and you will probably be given insulin after surgery (instead of oral diabetes medicines) to make sure you have good blood sugar levels. o The goal for blood sugar control after surgery is 80-180  mg/dL.   WHAT DO I DO ABOUT MY DIABETES MEDICATION?  Marland Kitchen Do not take oral diabetes medicines (pills) the morning of surgery.                               You may not have any metal on your body including hair              piercings  Do not wear jewelry,  lotions, powders or  deodorant              Men may shave face and neck.   Do not bring valuables to the hospital. [place].  Contacts, dentures or bridgework may not be worn into surgery. .     Patients discharged the day of surgery will not be allowed to drive home  . IF YOU ARE HAVING SURGERY AND GOING HOME THE SAME DAY, YOU MUST HAVE AN ADULT TO DRIVE YOU HOME AND BE WITH YOU FOR 24 HOURS.   YOU  MAY GO HOME BY TAXI OR UBER OR ORTHERWISE, BUT AN ADULT MUST ACCOMPANY YOU HOME AND STAY WITH YOU FOR 24 HOURS.  Name and phone number of your driver:  Special Instructions: N/A              Please read over the following fact sheets you were given: _____________________________________________________________________             [facility] - Preparing for Surgery Before surgery, you can play an important role.   Because skin is not sterile, your skin needs to be as free of germs as possible.   You can reduce the number of germs on your skin by washing with CHG (chlorahexidine gluconate) soap before surgery.   CHG is an antiseptic cleaner which kills germs and bonds with the skin to continue killing germs even after washing. Please DO NOT use if you have an allergy to CHG or antibacterial soaps .  If your skin becomes reddened/irritated stop using the CHG and inform your nurse when you arrive at Short Stay.   You may shave your face/neck.  Please follow these instructions carefully:  1.  Shower with CHG Soap the night before surgery and the  morning of Surgery.  2.  If you choose to wash your hair, wash your hair first as usual with your  normal  shampoo.  3.  After you shampoo, rinse your hair and body thoroughly to remove the  shampoo.                                        4.  Use CHG as you would any other liquid soap.  You can apply chg directly  to the skin and wash                       Gently with a scrungie or clean washcloth.  5.  Apply the CHG Soap to your body ONLY FROM THE NECK DOWN.   Do not use on face/ open                           Wound or open sores. Avoid contact with eyes, ears  mouth and genitals (private parts).                       Wash face,  Genitals (private parts) with your normal soap.             6.  Wash thoroughly, paying special attention to the area where your surgery  will be performed.  7.  Thoroughly rinse your body with warm water from the neck  down.  8.  DO NOT shower/wash with your normal soap after using and rinsing off  the CHG Soap.             9.  Pat yourself dry with a clean towel.            10.  Wear clean pajamas.            11.  Place clean sheets on your bed the night of your first shower and do not  sleep with pets. Day of Surgery : Do not apply any lotions/deodorants the morning of surgery.  Please wear clean clothes to the hospital/surgery center.    Incentive Spirometer  An incentive spirometer is a tool that can help keep your lungs clear and active. This tool measures how well you are filling your lungs with each breath. Taking long deep breaths may help reverse or decrease the chance of developing breathing (pulmonary) problems (especially infection) following:  A long period of time when you are unable to move or be active. BEFORE THE PROCEDURE   If the spirometer includes an indicator to show your best effort, your nurse or respiratory therapist will set it to a desired goal.  If possible, sit up straight or lean slightly forward. Try not to slouch.  Hold the incentive spirometer in an upright position. INSTRUCTIONS FOR USE  1. Sit on the edge of your bed if possible, or sit up as far as you can in bed or on a chair. 2. Hold the incentive spirometer in an upright position. 3. Breathe out normally. 4. Place the mouthpiece in your mouth and seal your lips tightly around it. 5. Breathe in slowly and as deeply as possible, raising the piston or the ball toward the top of the column. 6. Hold your breath for 3-5 seconds or for as long as possible. Allow the piston or ball to fall to the bottom of the column. 7. Remove the mouthpiece from your mouth and breathe out normally. 8. Rest for a few seconds and repeat Steps 1 through 7 at least 10 times every 1-2 hours when you are awake. Take your time and take a few normal breaths between deep breaths. 9. The spirometer may include an indicator to show your best  effort. Use the indicator as a goal to work toward during each repetition. 10. After each set of 10 deep breaths, practice coughing to be sure your lungs are clear. If you have an incision (the cut made at the time of surgery), support your incision when coughing by placing a pillow or rolled up towels firmly against it. Once you are able to get out of bed, walk around indoors and cough well. You may stop using the incentive spirometer when instructed by your caregiver.  RISKS AND COMPLICATIONS  Take your time so you do not get dizzy or light-headed.  If you are in pain, you may need to take or ask for pain medication before doing incentive spirometry. It is harder to take a deep breath  if you are having pain. AFTER USE  Rest and breathe slowly and easily.  It can be helpful to keep track of a log of your progress. Your caregiver can provide you with a simple table to help with this. If you are using the spirometer at home, follow these instructions: [place] IF:   You are having difficultly using the spirometer.  You have trouble using the spirometer as often as instructed.  Your pain medication is not giving enough relief while using the spirometer.  You develop fever of 100.5 F (38.1 C) or higher. SEEK IMMEDIATE MEDICAL CARE IF:   You cough up bloody sputum that had not been present before.  You develop fever of 102 F (38.9 C) or greater.  You develop worsening pain at or near the incision site. MAKE SURE YOU:   Understand these instructions.  Will watch your condition.  Will get help right away if you are not doing well or get worse. Document Released: 04/28/2007 Document Revised: 03/09/2012 Document Reviewed: 06/29/2007 ExitCare Patient Information 2014 [place].   ________________________________________________________________________   FAILURE TO FOLLOW THESE INSTRUCTIONS MAY RESULT IN THE CANCELLATION OF YOUR SURGERY PATIENT  SIGNATURE_________________________________  NURSE SIGNATURE__________________________________  ________________________________________________________________________

## 2020-07-19 ENCOUNTER — Other Ambulatory Visit (HOSPITAL_COMMUNITY): Payer: Medicare Other

## 2020-07-19 ENCOUNTER — Other Ambulatory Visit: Payer: Self-pay | Admitting: Cardiology

## 2020-07-20 ENCOUNTER — Encounter (HOSPITAL_COMMUNITY): Payer: Self-pay

## 2020-07-20 ENCOUNTER — Other Ambulatory Visit: Payer: Self-pay

## 2020-07-20 ENCOUNTER — Encounter (HOSPITAL_COMMUNITY)
Admission: RE | Admit: 2020-07-20 | Discharge: 2020-07-20 | Disposition: A | Discharge status: Home / Self Care | Payer: Medicare Other | Source: Ambulatory Visit | Attending: Orthopedic Surgery | Admitting: Orthopedic Surgery

## 2020-07-20 DIAGNOSIS — Z01812 Encounter for preprocedural laboratory examination: Secondary | ICD-10-CM | POA: Diagnosis not present

## 2020-07-20 DIAGNOSIS — M25551 Pain in right hip: Secondary | ICD-10-CM | POA: Insufficient documentation

## 2020-07-20 HISTORY — DX: Dyspnea, unspecified: R06.00

## 2020-07-20 LAB — BASIC METABOLIC PANEL
Anion gap: 13 (ref 5–15)
BUN: 24 mg/dL — ABNORMAL HIGH (ref 8–23)
CO2: 22 mmol/L (ref 22–32)
Calcium: 9.8 mg/dL (ref 8.9–10.3)
Chloride: 104 mmol/L (ref 98–111)
Creatinine, Ser: 1.65 mg/dL — ABNORMAL HIGH (ref 0.61–1.24)
GFR calc Af Amer: 46 mL/min — ABNORMAL LOW (ref >60)
GFR calc non Af Amer: 40 mL/min — ABNORMAL LOW (ref >60)
Glucose, Bld: 116 mg/dL — ABNORMAL HIGH (ref 70–99)
Potassium: 3.9 mmol/L (ref 3.5–5.1)
Sodium: 139 mmol/L (ref 135–145)

## 2020-07-20 LAB — CBC
HCT: 42.3 % (ref 39.0–52.0)
Hemoglobin: 14.3 g/dL (ref 13.0–17.0)
MCH: 32.8 pg (ref 26.0–34.0)
MCHC: 33.8 g/dL (ref 30.0–36.0)
MCV: 97 fL (ref 80.0–100.0)
Platelets: 258 10*3/uL (ref 150–400)
RBC: 4.36 MIL/uL (ref 4.22–5.81)
RDW: 12.4 % (ref 11.5–15.5)
WBC: 13.1 10*3/uL — ABNORMAL HIGH (ref 4.0–10.5)
nRBC: 0 % (ref 0.0–0.2)

## 2020-07-20 LAB — GLUCOSE, CAPILLARY: Glucose-Capillary: 110 mg/dL — ABNORMAL HIGH (ref 70–99)

## 2020-07-20 LAB — SURGICAL PCR SCREEN
MRSA, PCR: NEGATIVE
Staphylococcus aureus: POSITIVE — AB

## 2020-07-20 LAB — HEMOGLOBIN A1C
Hgb A1c MFr Bld: 6.4 % — ABNORMAL HIGH (ref 4.8–5.6)
Mean Plasma Glucose: 136.98 mg/dL

## 2020-07-20 NOTE — Progress Notes (Addendum)
COVID Vaccine Completed:Yes Date COVID Vaccine completed:04/11/20 COVID vaccine manufacturer: Pfizer     PCP - Dr. Hoover Brunette Cardiologist - Dr. Christen Butter  Chest x-ray - no EKG - 11/30/19 Stress Test - Pt is not sure ECHO - 02/23/19 Cardiac Cath - 2015,16,20  Sleep Study - no CPAP - no  Fasting Blood Sugar - 140-150 Checks Blood Sugar _____ times a day  Pt doesn't  Test it  Blood Thinner Instructions:ASA 81/ dr. Ardeth Perfect Aspirin Instructions:none/[place] Last Dose:  Anesthesia review:   Patient denies shortness of breath, fever, cough and chest pain at PAT appointment yes   Patient verbalized understanding of instructions that were given to them at the PAT appointment. Patient was also instructed that they will need to review over the PAT instructions again at home before surgery.  Yes  Pt has no SOB with ADLs or climbing stairs

## 2020-07-21 ENCOUNTER — Other Ambulatory Visit: Payer: Medicare Other

## 2020-07-21 DIAGNOSIS — M7061 Trochanteric bursitis, right hip: Secondary | ICD-10-CM | POA: Insufficient documentation

## 2020-07-21 DIAGNOSIS — M25551 Pain in right hip: Secondary | ICD-10-CM | POA: Diagnosis not present

## 2020-07-24 ENCOUNTER — Other Ambulatory Visit (HOSPITAL_COMMUNITY): Payer: Medicare Other

## 2020-07-25 ENCOUNTER — Other Ambulatory Visit (HOSPITAL_COMMUNITY)
Admission: RE | Admit: 2020-07-25 | Discharge: 2020-07-25 | Disposition: A | Discharge status: Home / Self Care | Payer: Medicare Other | Source: Ambulatory Visit | Attending: Orthopedic Surgery | Admitting: Orthopedic Surgery

## 2020-07-25 DIAGNOSIS — Z01812 Encounter for preprocedural laboratory examination: Secondary | ICD-10-CM | POA: Insufficient documentation

## 2020-07-25 DIAGNOSIS — Z20822 Contact with and (suspected) exposure to covid-19: Secondary | ICD-10-CM | POA: Diagnosis not present

## 2020-07-25 LAB — SARS CORONAVIRUS 2 (TAT 6-24 HRS): SARS Coronavirus 2: NEGATIVE

## 2020-07-27 NOTE — Progress Notes (Signed)
Pt aware to arrive at [facility] admitting at 10 am on 07/28/2020 for scheduled surgical procedure.

## 2020-07-28 ENCOUNTER — Ambulatory Visit (HOSPITAL_COMMUNITY): Payer: Medicare Other | Admitting: Physician Assistant

## 2020-07-28 ENCOUNTER — Encounter (HOSPITAL_COMMUNITY)
Admission: RE | Disposition: A | Discharge status: Home / Self Care | Payer: Self-pay | Source: Home / Self Care | Attending: Orthopedic Surgery

## 2020-07-28 ENCOUNTER — Encounter (HOSPITAL_COMMUNITY): Payer: Self-pay | Admitting: Orthopedic Surgery

## 2020-07-28 ENCOUNTER — Other Ambulatory Visit: Payer: Self-pay

## 2020-07-28 ENCOUNTER — Ambulatory Visit (HOSPITAL_COMMUNITY): Payer: Medicare Other | Admitting: Anesthesiology

## 2020-07-28 ENCOUNTER — Observation Stay (HOSPITAL_COMMUNITY)
Admission: RE | Admit: 2020-07-28 | Discharge: 2020-07-29 | Disposition: A | Discharge status: Home / Self Care | Payer: Medicare Other | Attending: Orthopedic Surgery | Admitting: Orthopedic Surgery

## 2020-07-28 DIAGNOSIS — G8918 Other acute postprocedural pain: Secondary | ICD-10-CM | POA: Diagnosis not present

## 2020-07-28 DIAGNOSIS — Z9889 Other specified postprocedural states: Secondary | ICD-10-CM

## 2020-07-28 DIAGNOSIS — Z79899 Other long term (current) drug therapy: Secondary | ICD-10-CM | POA: Diagnosis not present

## 2020-07-28 DIAGNOSIS — E119 Type 2 diabetes mellitus without complications: Secondary | ICD-10-CM | POA: Insufficient documentation

## 2020-07-28 DIAGNOSIS — M75121 Complete rotator cuff tear or rupture of right shoulder, not specified as traumatic: Secondary | ICD-10-CM | POA: Diagnosis not present

## 2020-07-28 DIAGNOSIS — I10 Essential (primary) hypertension: Secondary | ICD-10-CM | POA: Insufficient documentation

## 2020-07-28 DIAGNOSIS — M94211 Chondromalacia, right shoulder: Secondary | ICD-10-CM | POA: Diagnosis not present

## 2020-07-28 DIAGNOSIS — M25511 Pain in right shoulder: Secondary | ICD-10-CM | POA: Diagnosis present

## 2020-07-28 DIAGNOSIS — J449 Chronic obstructive pulmonary disease, unspecified: Secondary | ICD-10-CM | POA: Diagnosis not present

## 2020-07-28 DIAGNOSIS — M7541 Impingement syndrome of right shoulder: Secondary | ICD-10-CM | POA: Diagnosis not present

## 2020-07-28 DIAGNOSIS — Z87891 Personal history of nicotine dependence: Secondary | ICD-10-CM | POA: Insufficient documentation

## 2020-07-28 DIAGNOSIS — S46111A Strain of muscle, fascia and tendon of long head of biceps, right arm, initial encounter: Secondary | ICD-10-CM | POA: Diagnosis not present

## 2020-07-28 DIAGNOSIS — Z7982 Long term (current) use of aspirin: Secondary | ICD-10-CM | POA: Insufficient documentation

## 2020-07-28 DIAGNOSIS — Z96612 Presence of left artificial shoulder joint: Secondary | ICD-10-CM | POA: Insufficient documentation

## 2020-07-28 DIAGNOSIS — I2510 Atherosclerotic heart disease of native coronary artery without angina pectoris: Secondary | ICD-10-CM | POA: Insufficient documentation

## 2020-07-28 DIAGNOSIS — M75111 Incomplete rotator cuff tear or rupture of right shoulder, not specified as traumatic: Secondary | ICD-10-CM | POA: Diagnosis not present

## 2020-07-28 DIAGNOSIS — S43431A Superior glenoid labrum lesion of right shoulder, initial encounter: Secondary | ICD-10-CM | POA: Diagnosis not present

## 2020-07-28 DIAGNOSIS — Z7984 Long term (current) use of oral hypoglycemic drugs: Secondary | ICD-10-CM | POA: Insufficient documentation

## 2020-07-28 DIAGNOSIS — M7551 Bursitis of right shoulder: Secondary | ICD-10-CM | POA: Diagnosis not present

## 2020-07-28 DIAGNOSIS — M7542 Impingement syndrome of left shoulder: Secondary | ICD-10-CM | POA: Diagnosis not present

## 2020-07-28 HISTORY — PX: SHOULDER ARTHROSCOPY WITH SUBACROMIAL DECOMPRESSION AND OPEN ROTATOR C: SHX5688

## 2020-07-28 LAB — GLUCOSE, CAPILLARY
Glucose-Capillary: 164 mg/dL — ABNORMAL HIGH (ref 70–99)
Glucose-Capillary: 153 mg/dL — ABNORMAL HIGH (ref 70–99)
Glucose-Capillary: 325 mg/dL — ABNORMAL HIGH (ref 70–99)

## 2020-07-28 SURGERY — SHOULDER ARTHROSCOPY WITH SUBACROMIAL DECOMPRESSION AND OPEN ROTATOR CUFF REPAIR, OPEN BICEPS TENDON REPAIR
Anesthesia: General | Site: Shoulder | Laterality: Right

## 2020-07-28 MED ORDER — ONDANSETRON HCL 4 MG PO TABS: 4.0000 mg | ORAL_TABLET | Freq: Four times a day (QID) | ORAL | Status: DC | PRN

## 2020-07-28 MED ORDER — PHENOL 1.4 % MT LIQD: 1.0000 | OROMUCOSAL | Status: DC | PRN

## 2020-07-28 MED ORDER — BUPIVACAINE HCL (PF) 0.5 % IJ SOLN
INTRAMUSCULAR | Status: DC | PRN
Start: 2020-07-28 — End: 2020-07-28
  Administered 2020-07-28: 15 mL

## 2020-07-28 MED ORDER — PHENYLEPHRINE HCL (PRESSORS) 10 MG/ML IV SOLN
INTRAVENOUS | Status: AC
  Filled 2020-07-28: qty 1

## 2020-07-28 MED ORDER — CHLORHEXIDINE GLUCONATE 0.12 % MT SOLN
15.0000 mL | Freq: Once | OROMUCOSAL | Status: AC
  Administered 2020-07-28: 15 mL via OROMUCOSAL

## 2020-07-28 MED ORDER — DOCUSATE SODIUM 100 MG PO CAPS
100.0000 mg | ORAL_CAPSULE | Freq: Two times a day (BID) | ORAL | Status: DC
  Administered 2020-07-29: 100 mg via ORAL
  Filled 2020-07-28: qty 1

## 2020-07-28 MED ORDER — ACETAMINOPHEN 10 MG/ML IV SOLN: 1000.0000 mg | Freq: Once | INTRAVENOUS | Status: DC | PRN

## 2020-07-28 MED ORDER — PROPOFOL 10 MG/ML IV BOLUS
INTRAVENOUS | Status: DC | PRN
  Administered 2020-07-28: 150 mg via INTRAVENOUS

## 2020-07-28 MED ORDER — METOCLOPRAMIDE HCL 5 MG PO TABS: 5.0000 mg | ORAL_TABLET | Freq: Three times a day (TID) | ORAL | Status: DC | PRN

## 2020-07-28 MED ORDER — DOCUSATE SODIUM 100 MG PO CAPS: 100.0000 mg | ORAL_CAPSULE | Freq: Every day | ORAL | Status: DC

## 2020-07-28 MED ORDER — ONDANSETRON HCL 4 MG/2ML IJ SOLN
INTRAMUSCULAR | Status: DC | PRN
  Administered 2020-07-28: 4 mg via INTRAVENOUS

## 2020-07-28 MED ORDER — EPHEDRINE 5 MG/ML INJ
INTRAVENOUS | Status: AC
  Filled 2020-07-28: qty 10

## 2020-07-28 MED ORDER — INSULIN ASPART 100 UNIT/ML ~~LOC~~ SOLN: 4.0000 [IU] | Freq: Three times a day (TID) | SUBCUTANEOUS | Status: DC

## 2020-07-28 MED ORDER — BUPIVACAINE-EPINEPHRINE 0.25% -1:200000 IJ SOLN
INTRAMUSCULAR | Status: DC | PRN
  Administered 2020-07-28: 10 mL

## 2020-07-28 MED ORDER — FENTANYL CITRATE (PF) 100 MCG/2ML IJ SOLN: 25.0000 ug | INTRAMUSCULAR | Status: DC | PRN

## 2020-07-28 MED ORDER — ASPIRIN EC 81 MG PO TBEC
81.0000 mg | DELAYED_RELEASE_TABLET | Freq: Every day | ORAL | Status: DC
  Administered 2020-07-29: 81 mg via ORAL
  Filled 2020-07-28: qty 1

## 2020-07-28 MED ORDER — LIDOCAINE 2% (20 MG/ML) 5 ML SYRINGE
INTRAMUSCULAR | Status: DC | PRN
  Administered 2020-07-28: 40 mg via INTRAVENOUS

## 2020-07-28 MED ORDER — INSULIN ASPART 100 UNIT/ML ~~LOC~~ SOLN
0.0000 [IU] | Freq: Three times a day (TID) | SUBCUTANEOUS | Status: DC
  Administered 2020-07-28: 3 [IU] via SUBCUTANEOUS

## 2020-07-28 MED ORDER — MENTHOL 3 MG MT LOZG: 1.0000 | LOZENGE | OROMUCOSAL | Status: DC | PRN

## 2020-07-28 MED ORDER — PHENYLEPHRINE HCL-NACL 10-0.9 MG/250ML-% IV SOLN
INTRAVENOUS | Status: DC | PRN
Start: 2020-07-28 — End: 2020-07-28
  Administered 2020-07-28: 25 ug/min via INTRAVENOUS

## 2020-07-28 MED ORDER — DEXAMETHASONE SODIUM PHOSPHATE 10 MG/ML IJ SOLN
INTRAMUSCULAR | Status: DC | PRN
  Administered 2020-07-28: 8 mg via INTRAVENOUS

## 2020-07-28 MED ORDER — DEXAMETHASONE SODIUM PHOSPHATE 10 MG/ML IJ SOLN
INTRAMUSCULAR | Status: AC
  Filled 2020-07-28: qty 1

## 2020-07-28 MED ORDER — EPHEDRINE SULFATE-NACL 50-0.9 MG/10ML-% IV SOSY
PREFILLED_SYRINGE | INTRAVENOUS | Status: DC | PRN
  Administered 2020-07-28: 10 mg via INTRAVENOUS

## 2020-07-28 MED ORDER — BUPIVACAINE-EPINEPHRINE (PF) 0.25% -1:200000 IJ SOLN
INTRAMUSCULAR | Status: AC
  Filled 2020-07-28: qty 30

## 2020-07-28 MED ORDER — AMLODIPINE BESYLATE 10 MG PO TABS
10.0000 mg | ORAL_TABLET | Freq: Every day | ORAL | Status: DC
  Administered 2020-07-29: 10 mg via ORAL
  Filled 2020-07-28: qty 1

## 2020-07-28 MED ORDER — FENTANYL CITRATE (PF) 100 MCG/2ML IJ SOLN
INTRAMUSCULAR | Status: AC
  Administered 2020-07-28: 100 ug via INTRAVENOUS
  Filled 2020-07-28: qty 2

## 2020-07-28 MED ORDER — CEFAZOLIN SODIUM-DEXTROSE 2-4 GM/100ML-% IV SOLN
INTRAVENOUS | Status: AC
  Filled 2020-07-28: qty 100

## 2020-07-28 MED ORDER — LIDOCAINE 2% (20 MG/ML) 5 ML SYRINGE
INTRAMUSCULAR | Status: AC
  Filled 2020-07-28: qty 5

## 2020-07-28 MED ORDER — SODIUM CHLORIDE 0.9 % IR SOLN
Status: DC | PRN
  Administered 2020-07-28: 1000 mL

## 2020-07-28 MED ORDER — SUCCINYLCHOLINE CHLORIDE 200 MG/10ML IV SOSY
PREFILLED_SYRINGE | INTRAVENOUS | Status: AC
  Filled 2020-07-28: qty 10

## 2020-07-28 MED ORDER — LORATADINE 10 MG PO TABS
10.0000 mg | ORAL_TABLET | Freq: Every day | ORAL | Status: DC
  Administered 2020-07-29: 10 mg via ORAL
  Filled 2020-07-28: qty 1

## 2020-07-28 MED ORDER — FENTANYL CITRATE (PF) 100 MCG/2ML IJ SOLN
INTRAMUSCULAR | Status: AC
  Filled 2020-07-28: qty 2

## 2020-07-28 MED ORDER — MIDAZOLAM HCL 2 MG/2ML IJ SOLN
INTRAMUSCULAR | Status: AC
  Filled 2020-07-28: qty 2

## 2020-07-28 MED ORDER — HYDROMORPHONE HCL 1 MG/ML IJ SOLN: 0.5000 mg | INTRAMUSCULAR | Status: DC | PRN

## 2020-07-28 MED ORDER — ATORVASTATIN CALCIUM 20 MG PO TABS
20.0000 mg | ORAL_TABLET | Freq: Every day | ORAL | Status: DC
  Administered 2020-07-29: 20 mg via ORAL
  Filled 2020-07-28: qty 1

## 2020-07-28 MED ORDER — MIDAZOLAM HCL 2 MG/2ML IJ SOLN: 1.0000 mg | INTRAMUSCULAR | Status: DC

## 2020-07-28 MED ORDER — TRAMADOL HCL 50 MG PO TABS: 50.0000 mg | ORAL_TABLET | Freq: Four times a day (QID) | ORAL | Status: DC | PRN

## 2020-07-28 MED ORDER — BUPIVACAINE-EPINEPHRINE (PF) 0.25% -1:200000 IJ SOLN
INTRAMUSCULAR | Status: DC | PRN
  Administered 2020-07-28: 5 mL via PERINEURAL

## 2020-07-28 MED ORDER — ORAL CARE MOUTH RINSE: 15.0000 mL | Freq: Once | OROMUCOSAL | Status: AC

## 2020-07-28 MED ORDER — CEFAZOLIN SODIUM-DEXTROSE 2-4 GM/100ML-% IV SOLN
2.0000 g | Freq: Four times a day (QID) | INTRAVENOUS | Status: AC
  Administered 2020-07-28 – 2020-07-29 (×3): 2 g via INTRAVENOUS
  Filled 2020-07-28 (×3): qty 100

## 2020-07-28 MED ORDER — ACETAMINOPHEN 325 MG PO TABS: 325.0000 mg | ORAL_TABLET | Freq: Four times a day (QID) | ORAL | Status: DC | PRN

## 2020-07-28 MED ORDER — NITROGLYCERIN 0.4 MG SL SUBL: 0.4000 mg | SUBLINGUAL_TABLET | SUBLINGUAL | Status: DC | PRN

## 2020-07-28 MED ORDER — METOPROLOL SUCCINATE ER 100 MG PO TB24
100.0000 mg | ORAL_TABLET | Freq: Every day | ORAL | Status: DC
  Administered 2020-07-29: 100 mg via ORAL
  Filled 2020-07-28: qty 1

## 2020-07-28 MED ORDER — BISACODYL 10 MG RE SUPP: 10.0000 mg | Freq: Every day | RECTAL | Status: DC | PRN

## 2020-07-28 MED ORDER — ONDANSETRON HCL 4 MG/2ML IJ SOLN: 4.0000 mg | Freq: Four times a day (QID) | INTRAMUSCULAR | Status: DC | PRN

## 2020-07-28 MED ORDER — BUPIVACAINE LIPOSOME 1.3 % IJ SUSP
INTRAMUSCULAR | Status: DC | PRN
  Administered 2020-07-28: 10 mL

## 2020-07-28 MED ORDER — OXYCODONE-ACETAMINOPHEN 5-325 MG PO TABS: 1.0000 | ORAL_TABLET | Freq: Four times a day (QID) | ORAL | 0 refills | Status: DC | PRN

## 2020-07-28 MED ORDER — SUCCINYLCHOLINE CHLORIDE 200 MG/10ML IV SOSY
PREFILLED_SYRINGE | INTRAVENOUS | Status: DC | PRN
  Administered 2020-07-28: 140 mg via INTRAVENOUS

## 2020-07-28 MED ORDER — SODIUM CHLORIDE 0.9 % IV SOLN: INTRAVENOUS | Status: DC

## 2020-07-28 MED ORDER — CYCLOBENZAPRINE HCL 10 MG PO TABS: 10.0000 mg | ORAL_TABLET | Freq: Three times a day (TID) | ORAL | 0 refills | Status: DC | PRN

## 2020-07-28 MED ORDER — MIDAZOLAM HCL 2 MG/2ML IJ SOLN
INTRAMUSCULAR | Status: DC | PRN
  Administered 2020-07-28: .5 mg via INTRAVENOUS

## 2020-07-28 MED ORDER — METFORMIN HCL 500 MG PO TABS
1000.0000 mg | ORAL_TABLET | Freq: Every day | ORAL | Status: DC
  Administered 2020-07-29: 1000 mg via ORAL
  Filled 2020-07-28: qty 2

## 2020-07-28 MED ORDER — PROPOFOL 10 MG/ML IV BOLUS
INTRAVENOUS | Status: AC
  Filled 2020-07-28: qty 20

## 2020-07-28 MED ORDER — ONDANSETRON HCL 4 MG/2ML IJ SOLN
INTRAMUSCULAR | Status: AC
  Filled 2020-07-28: qty 2

## 2020-07-28 MED ORDER — FENTANYL CITRATE (PF) 100 MCG/2ML IJ SOLN: 50.0000 ug | INTRAMUSCULAR | Status: DC

## 2020-07-28 MED ORDER — PANTOPRAZOLE SODIUM 40 MG PO TBEC
40.0000 mg | DELAYED_RELEASE_TABLET | Freq: Every day | ORAL | Status: DC
  Administered 2020-07-29: 40 mg via ORAL
  Filled 2020-07-28: qty 1

## 2020-07-28 MED ORDER — ACETAMINOPHEN 500 MG PO TABS
500.0000 mg | ORAL_TABLET | Freq: Four times a day (QID) | ORAL | Status: AC | PRN
  Administered 2020-07-29: 1000 mg via ORAL
  Filled 2020-07-28: qty 2

## 2020-07-28 MED ORDER — FENTANYL CITRATE (PF) 100 MCG/2ML IJ SOLN
INTRAMUSCULAR | Status: DC | PRN
  Administered 2020-07-28: 50 ug via INTRAVENOUS

## 2020-07-28 MED ORDER — OXYCODONE HCL 5 MG PO TABS: 5.0000 mg | ORAL_TABLET | ORAL | Status: DC | PRN

## 2020-07-28 MED ORDER — LACTATED RINGERS IV SOLN: INTRAVENOUS | Status: DC

## 2020-07-28 MED ORDER — LOSARTAN POTASSIUM 50 MG PO TABS
100.0000 mg | ORAL_TABLET | Freq: Every day | ORAL | Status: DC
  Administered 2020-07-29: 100 mg via ORAL
  Filled 2020-07-28: qty 2

## 2020-07-28 MED ORDER — MIDAZOLAM HCL 2 MG/2ML IJ SOLN
INTRAMUSCULAR | Status: AC
  Administered 2020-07-28: 1 mg via INTRAVENOUS
  Filled 2020-07-28: qty 2

## 2020-07-28 MED ORDER — SODIUM CHLORIDE 0.9 % IR SOLN
Status: DC | PRN
  Administered 2020-07-28 (×3): 3000 mL

## 2020-07-28 MED ORDER — CHLORTHALIDONE 25 MG PO TABS
25.0000 mg | ORAL_TABLET | Freq: Every morning | ORAL | Status: DC
  Administered 2020-07-29: 25 mg via ORAL
  Filled 2020-07-28: qty 1

## 2020-07-28 MED ORDER — METOCLOPRAMIDE HCL 5 MG/ML IJ SOLN: 5.0000 mg | Freq: Three times a day (TID) | INTRAMUSCULAR | Status: DC | PRN

## 2020-07-28 MED ORDER — INSULIN ASPART 100 UNIT/ML ~~LOC~~ SOLN
0.0000 [IU] | Freq: Every day | SUBCUTANEOUS | Status: DC
  Administered 2020-07-28: 4 [IU] via SUBCUTANEOUS

## 2020-07-28 MED ORDER — CEFAZOLIN SODIUM-DEXTROSE 2-4 GM/100ML-% IV SOLN
2.0000 g | INTRAVENOUS | Status: AC
  Administered 2020-07-28: 2 g via INTRAVENOUS

## 2020-07-28 MED ORDER — CYCLOBENZAPRINE HCL 10 MG PO TABS: 10.0000 mg | ORAL_TABLET | Freq: Three times a day (TID) | ORAL | Status: DC | PRN

## 2020-07-28 SURGICAL SUPPLY — 62 items
ANCHOR FBRTK 2.6 SUTURETAP 1.3 (Anchor) ×2 IMPLANT
APL SKNCLS STERI-STRIP NONHPOA (GAUZE/BANDAGES/DRESSINGS) ×1
BENZOIN TINCTURE PRP APPL 2/3 (GAUZE/BANDAGES/DRESSINGS) ×2 IMPLANT
BLADE AVERAGE 25X9 (BLADE) IMPLANT
BLADE CLIPPER SURG (BLADE) IMPLANT
BLADE EXCALIBUR 4.0X13 (MISCELLANEOUS) IMPLANT
BLADE SURG 15 STRL LF DISP TIS (BLADE) ×1 IMPLANT
BLADE SURG 15 STRL SS (BLADE) ×2
BURR OVAL 8 FLU 4.0X13 (MISCELLANEOUS) ×4 IMPLANT
CLSR STERI-STRIP ANTIMIC 1/2X4 (GAUZE/BANDAGES/DRESSINGS) ×2 IMPLANT
COVER WAND RF STERILE (DRAPES) IMPLANT
CUTTER BONE 4.0MM X 13CM (MISCELLANEOUS) ×4 IMPLANT
DECANTER SPIKE VIAL GLASS SM (MISCELLANEOUS) IMPLANT
DISSECTOR  3.8MM X 13CM (MISCELLANEOUS)
DISSECTOR 3.8MM X 13CM (MISCELLANEOUS) IMPLANT
DRAPE IMP U-DRAPE 54X76 (DRAPES) ×4 IMPLANT
DRAPE INCISE IOBAN 66X45 STRL (DRAPES) ×2 IMPLANT
DRAPE SHOULDER BEACH CHAIR (DRAPES) IMPLANT
DRAPE STERI 35X30 U-POUCH (DRAPES) ×2 IMPLANT
DRAPE U-SHAPE 47X51 STRL (DRAPES) ×2 IMPLANT
DRSG EMULSION OIL 3X3 NADH (GAUZE/BANDAGES/DRESSINGS) ×2 IMPLANT
DRSG PAD ABDOMINAL 8X10 ST (GAUZE/BANDAGES/DRESSINGS) ×2 IMPLANT
DURAPREP 26ML APPLICATOR (WOUND CARE) ×2 IMPLANT
ELECT NEEDLE TIP 2.8 STRL (NEEDLE) ×2 IMPLANT
ELECT REM PT RETURN 15FT ADLT (MISCELLANEOUS) ×2 IMPLANT
GAUZE SPONGE 4X4 12PLY STRL (GAUZE/BANDAGES/DRESSINGS) ×2 IMPLANT
GLOVE BIOGEL PI IND STRL 7.5 (GLOVE) ×1 IMPLANT
GLOVE BIOGEL PI IND STRL 8.5 (GLOVE) ×1 IMPLANT
GLOVE BIOGEL PI INDICATOR 7.5 (GLOVE) ×1
GLOVE BIOGEL PI INDICATOR 8.5 (GLOVE) ×1
GLOVE ORTHO TXT STRL SZ7.5 (GLOVE) ×2 IMPLANT
GOWN STRL REUS W/ TWL LRG LVL3 (GOWN DISPOSABLE) ×3 IMPLANT
GOWN STRL REUS W/TWL LRG LVL3 (GOWN DISPOSABLE) ×6
KIT BASIN OR (CUSTOM PROCEDURE TRAY) ×2 IMPLANT
NEEDLE MA TROC 1/2 CIR (NEEDLE) ×2 IMPLANT
NEEDLE MAYO 6 CRC TAPER PT (NEEDLE) ×2 IMPLANT
NEEDLE MAYO CATGUT SZ4 (NEEDLE) IMPLANT
NS IRRIG 1000ML POUR BTL (IV SOLUTION) ×2 IMPLANT
PACK ARTHROSCOPY WL (CUSTOM PROCEDURE TRAY) ×2 IMPLANT
PAD ORTHO SHOULDER 7X19 LRG (SOFTGOODS) IMPLANT
PENCIL SMOKE EVACUATOR (MISCELLANEOUS) IMPLANT
PORT APPOLLO RF 90DEGREE MULTI (SURGICAL WAND) IMPLANT
SLING ARM FOAM STRAP LRG (SOFTGOODS) ×2 IMPLANT
SPONGE LAP 4X18 RFD (DISPOSABLE) IMPLANT
STAPLER VISISTAT 35W (STAPLE) IMPLANT
STRIP CLOSURE SKIN 1/2X4 (GAUZE/BANDAGES/DRESSINGS) IMPLANT
SUT BONE WAX W31G (SUTURE) IMPLANT
SUT FIBERWIRE #2 38 T-5 BLUE (SUTURE) ×2
SUT MNCRL AB 4-0 PS2 18 (SUTURE) ×2 IMPLANT
SUT VIC AB 0 CT1 18XCR BRD 8 (SUTURE) IMPLANT
SUT VIC AB 0 CT1 27 (SUTURE) ×2
SUT VIC AB 0 CT1 27XBRD ANBCTR (SUTURE) ×1 IMPLANT
SUT VIC AB 0 CT1 8-18 (SUTURE)
SUT VIC AB 2-0 CT1 27 (SUTURE)
SUT VIC AB 2-0 CT1 TAPERPNT 27 (SUTURE) IMPLANT
SUT VICRYL 0 CT-2 (SUTURE) IMPLANT
SUTURE FIBERWR #2 38 T-5 BLUE (SUTURE) ×1 IMPLANT
SYR BULB EAR ULCER 3OZ GRN STR (SYRINGE) IMPLANT
TOWEL OR 17X26 10 PK STRL BLUE (TOWEL DISPOSABLE) ×2 IMPLANT
TUBING ARTHROSCOPY IRRIG 16FT (MISCELLANEOUS) ×2 IMPLANT
TUBING CONNECTING 10 (TUBING) ×2 IMPLANT
YANKAUER SUCT BULB TIP NO VENT (SUCTIONS) IMPLANT

## 2020-07-28 NOTE — Anesthesia Preprocedure Evaluation (Addendum)
Anesthesia Evaluation  Patient identified by MRN, date of birth, ID band Patient awake    Reviewed: Allergy & Precautions, NPO status , Patient's Chart, lab work & pertinent test results  Airway Mallampati: II  TM Distance: >3 FB Neck ROM: Full    Dental no notable dental hx.    Pulmonary COPD, former smoker,    Pulmonary exam normal breath sounds clear to auscultation       Cardiovascular hypertension, + CAD, + Cardiac Stents and + Peripheral Vascular Disease  Normal cardiovascular exam Rhythm:Regular Rate:Normal  Normal EF   Neuro/Psych negative neurological ROS  negative psych ROS   GI/Hepatic negative GI ROS, Neg liver ROS,   Endo/Other  negative endocrine ROSdiabetes  Renal/GU negative Renal ROS  negative genitourinary   Musculoskeletal negative musculoskeletal ROS (+)   Abdominal   Peds negative pediatric ROS (+)  Hematology negative hematology ROS (+)   Anesthesia Other Findings   Reproductive/Obstetrics negative OB ROS                            Anesthesia Physical Anesthesia Plan  ASA: III  Anesthesia Plan: General   Post-op Pain Management:    Induction: Intravenous  PONV Risk Score and Plan: 2 and Ondansetron, Dexamethasone and Treatment may vary due to age or medical condition  Airway Management Planned: Oral ETT  Additional Equipment:   Intra-op Plan:   Post-operative Plan: Extubation in OR  Informed Consent: I have reviewed the patients History and Physical, chart, labs and discussed the procedure including the risks, benefits and alternatives for the proposed anesthesia with the patient or authorized representative who has indicated his/her understanding and acceptance.     Dental advisory given  Plan Discussed with: CRNA and Surgeon  Anesthesia Plan Comments:         Anesthesia Quick Evaluation

## 2020-07-28 NOTE — Brief Op Note (Signed)
07/28/2020  2:32 PM  PATIENT:  Miguel Brock  74 y.o. male  PRE-OPERATIVE DIAGNOSIS:  Right shoulder cuff tear OS instability  POST-OPERATIVE DIAGNOSIS:  Right shoulder cuff tear, partial thickness, SLAP tear, biceps tear, glenoid chondromalacia, No evidence of Os Acromionale instability  PROCEDURE:  Right shoulder scope with extensive debridement of SLAP tear and biceps tenotomy and glenoid chondromalacia, debridement of partial thickness RCT, Arthroscopic subacromial decompressions, biceps tenodesis  SURGEON:  Surgeon(s) and Role:    Netta Cedars, MD - Primary  PHYSICIAN ASSISTANT:   ASSISTANTS: Ventura Bruns, PA-C   ANESTHESIA:   regional and general  EBL:  20 mL   BLOOD ADMINISTERED:none  DRAINS: none   LOCAL MEDICATIONS USED:  MARCAINE     SPECIMEN:  No Specimen  DISPOSITION OF SPECIMEN:  N/A  COUNTS:  YES  TOURNIQUET:  * No tourniquets in log *  DICTATION: .Other Dictation: Dictation Number [id number]  PLAN OF CARE: Admit for overnight observation  PATIENT DISPOSITION:  PACU - hemodynamically stable.   Delay start of Pharmacological VTE agent (>24hrs) due to surgical blood loss or risk of bleeding: not applicable

## 2020-07-28 NOTE — Discharge Instructions (Signed)
Ice to the shoulder constantly.  Keep the incision covered and clean and dry for one week, then ok to get it wet in the shower.  Do exercise as instructed several times per day.This prevents stiffness. Use a pillow under the arm at home for support.   DO NOT reach behind your back or push up out of a chair with the operative arm.  Use a sling while you are up and around for comfort, may remove while seated.  Keep pillow propped behind the operative elbow.  Follow up with Dr Veverly Fells in two weeks in the office, call [phone] for appt

## 2020-07-28 NOTE — Anesthesia Postprocedure Evaluation (Signed)
Anesthesia Post Note  Patient: Miguel Brock  Procedure(s) Performed: SHOULDER ARTHROSCOPY WITH SUBACROMIAL DECOMPRESSION AND OPEN ROTATOR CUFF REPAIR, OPEN BICEP TENDON REPAIR Open OS acromiale open reduction and internal fixation (Right Shoulder)     Patient location during evaluation: PACU Anesthesia Type: General Level of consciousness: awake and alert Pain management: pain level controlled Vital Signs Assessment: post-procedure vital signs reviewed and stable Respiratory status: spontaneous breathing, nonlabored ventilation, respiratory function stable and patient connected to nasal cannula oxygen Cardiovascular status: blood pressure returned to baseline and stable Postop Assessment: no apparent nausea or vomiting Anesthetic complications: no   No complications documented.  Last Vitals:  Vitals:   07/28/20 1515 07/28/20 1530  BP: 111/65 (!) 117/64  Pulse: 45 50  Resp: 13 15  Temp:    SpO2: 94% 95%    Last Pain:  Vitals:   07/28/20 1530  PainSc: 0-No pain                 [name] S

## 2020-07-28 NOTE — Transfer of Care (Signed)
Immediate Anesthesia Transfer of Care Note  Patient: Miguel Brock  Procedure(s) Performed: SHOULDER ARTHROSCOPY WITH SUBACROMIAL DECOMPRESSION AND OPEN ROTATOR CUFF REPAIR, OPEN BICEP TENDON REPAIR Open OS acromiale open reduction and internal fixation (Right Shoulder)  Patient Location: PACU  Anesthesia Type:General  Level of Consciousness: awake, alert  and oriented  Airway & Oxygen Therapy: Patient Spontanous Breathing and Patient connected to face mask oxygen  Post-op Assessment: Report given to RN and Post -op Vital signs reviewed and stable  Post vital signs: Reviewed and stable  Last Vitals:  Vitals Value Taken Time  BP    Temp    Pulse 55 07/28/20 1441  Resp    SpO2 99 % 07/28/20 1441  Vitals shown include unvalidated device data.  Last Pain:  Vitals:   07/28/20 1059  PainSc: 5       Patients Stated Pain Goal: 5 (56/97/94 8016)  Complications: No complications documented.

## 2020-07-28 NOTE — Anesthesia Procedure Notes (Signed)
Anesthesia Procedure Image    

## 2020-07-28 NOTE — Progress Notes (Signed)
Assisted Dr. Rose with right, ultrasound guided, interscalene  block. Side rails up, monitors on throughout procedure. See vital signs in flow sheet. Tolerated Procedure well. 

## 2020-07-28 NOTE — Anesthesia Procedure Notes (Signed)
Procedure Name: Intubation Date/Time: 07/28/2020 12:53 PM Performed by: Niel Hummer, CRNA Pre-anesthesia Checklist: Patient identified, Emergency Drugs available, Suction available and Patient being monitored Patient Re-evaluated:Patient Re-evaluated prior to induction Oxygen Delivery Method: Circle system utilized Preoxygenation: Pre-oxygenation with 100% oxygen Induction Type: IV induction Ventilation: Mask ventilation without difficulty and Oral airway inserted - appropriate to patient size Laryngoscope Size: Mac and 4 Grade View: Grade I Tube type: Oral Tube size: 7.5 mm Airway Equipment and Method: Stylet Placement Confirmation: ETT inserted through vocal cords under direct vision,  positive ETCO2 and breath sounds checked- equal and bilateral Secured at: 23 cm Tube secured with: Tape Dental Injury: Teeth and Oropharynx as per pre-operative assessment

## 2020-07-28 NOTE — Anesthesia Procedure Notes (Signed)
Anesthesia Regional Block: Interscalene brachial plexus block   Pre-Anesthetic Checklist: ,, timeout performed, Correct Patient, Correct Site, Correct Laterality, Correct Procedure, Correct Position, site marked, Risks and benefits discussed,  Surgical consent,  Pre-op evaluation,  At surgeon's request and post-op pain management  Laterality: Right  Prep: chloraprep       Needles:  Injection technique: Single-shot  Needle Type: Echogenic Needle     Needle Length: 9cm      Additional Needles:   Procedures:,,,, ultrasound used (permanent image in chart),,,,  Narrative:  Start time: 07/28/2020 11:43 AM End time: 07/28/2020 11:50 AM Injection made incrementally with aspirations every 5 mL.  Performed by: Personally  Anesthesiologist: Myrtie Soman, MD  Additional Notes: Patient tolerated the procedure well without complications

## 2020-07-28 NOTE — Interval H&P Note (Signed)
History and Physical Interval Note:  07/28/2020 12:06 PM  Miguel Brock  has presented today for surgery, with the diagnosis of Right shoulder cuff tear OS instability.  The various methods of treatment have been discussed with the patient and family. After consideration of risks, benefits and other options for treatment, the patient has consented to  Procedure(s) with comments: SHOULDER ARTHROSCOPY WITH SUBACROMIAL DECOMPRESSION AND OPEN ROTATOR CUFF REPAIR, OPEN BICEP TENDON REPAIR Open OS acromiale open reduction and internal fixation (Right) - interscalene block as a surgical intervention.  The patient's history has been reviewed, patient examined, no change in status, stable for surgery.  I have reviewed the patient's chart and labs.  Questions were answered to the patient's satisfaction.     [name]

## 2020-07-29 DIAGNOSIS — I2510 Atherosclerotic heart disease of native coronary artery without angina pectoris: Secondary | ICD-10-CM | POA: Diagnosis not present

## 2020-07-29 DIAGNOSIS — I10 Essential (primary) hypertension: Secondary | ICD-10-CM | POA: Diagnosis not present

## 2020-07-29 DIAGNOSIS — E119 Type 2 diabetes mellitus without complications: Secondary | ICD-10-CM | POA: Diagnosis not present

## 2020-07-29 DIAGNOSIS — J449 Chronic obstructive pulmonary disease, unspecified: Secondary | ICD-10-CM | POA: Diagnosis not present

## 2020-07-29 DIAGNOSIS — Z96612 Presence of left artificial shoulder joint: Secondary | ICD-10-CM | POA: Diagnosis not present

## 2020-07-29 DIAGNOSIS — M75121 Complete rotator cuff tear or rupture of right shoulder, not specified as traumatic: Secondary | ICD-10-CM | POA: Diagnosis not present

## 2020-07-29 LAB — BASIC METABOLIC PANEL
Anion gap: 10 (ref 5–15)
BUN: 25 mg/dL — ABNORMAL HIGH (ref 8–23)
CO2: 23 mmol/L (ref 22–32)
Calcium: 9.3 mg/dL (ref 8.9–10.3)
Chloride: 103 mmol/L (ref 98–111)
Creatinine, Ser: 1.48 mg/dL — ABNORMAL HIGH (ref 0.61–1.24)
GFR calc Af Amer: 53 mL/min — ABNORMAL LOW (ref >60)
GFR calc non Af Amer: 46 mL/min — ABNORMAL LOW (ref >60)
Glucose, Bld: 212 mg/dL — ABNORMAL HIGH (ref 70–99)
Potassium: 3.8 mmol/L (ref 3.5–5.1)
Sodium: 136 mmol/L (ref 135–145)

## 2020-07-29 LAB — GLUCOSE, CAPILLARY
Glucose-Capillary: 199 mg/dL — ABNORMAL HIGH (ref 70–99)
Glucose-Capillary: 205 mg/dL — ABNORMAL HIGH (ref 70–99)
Glucose-Capillary: 208 mg/dL — ABNORMAL HIGH (ref 70–99)
Glucose-Capillary: 299 mg/dL — ABNORMAL HIGH (ref 70–99)

## 2020-07-29 LAB — HEMOGLOBIN AND HEMATOCRIT, BLOOD
HCT: 40 % (ref 39.0–52.0)
Hemoglobin: 13.3 g/dL (ref 13.0–17.0)

## 2020-07-29 MED ORDER — CYCLOBENZAPRINE HCL 10 MG PO TABS: 10.0000 mg | ORAL_TABLET | Freq: Three times a day (TID) | ORAL | 0 refills | Status: DC | PRN

## 2020-07-29 NOTE — Progress Notes (Signed)
Pt VS WNL.  Discharge instructions provided to and reviewed with pt.  PIV discontinued.  Catheter intact and clotting within expected timeframe. Pt transported via wheelchair to front lobby by 4E staff.

## 2020-07-29 NOTE — Plan of Care (Signed)

## 2020-07-29 NOTE — Evaluation (Signed)
Occupational Therapy Evaluation Patient Details Name: Miguel Brock MRN: [id number] DOB: [birth year] Today's Date: 07/29/2020    History of Present Illness Patient is a 74 year old man with prior L RTSA and presents today s/p right rotator cuff repair.   Clinical Impression   Patient presents s/p right rotator cuff repair without functional use of right non-dominant upper extremity secondary to effects of surgery and interscalene block and shoulder precautions. Therapist provided education and instruction to patient in regards to exercises, precautions, positioning, donning upper extremity clothing and bathing while maintaining shoulder precautions, ice and edema management and donning/doffing sling. Patient verbalized understanding and demonstrated as needed. Patient needed assistance to donn shirt and sling and provided with instruction on compensatory strategies to perform ADLs. Patient to follow up with MD for further therapy needs.      Follow Up Recommendations  Follow surgeon's recommendation for DC plan and follow-up therapies    Equipment Recommendations  None recommended by OT    Recommendations for Other Services       Precautions / Restrictions Precautions Precautions: Shoulder Type of Shoulder Precautions: AROM No, PROM NO, AROM of elbow, wrist and hand only. Shoulder Interventions: Shoulder sling/immobilizer;At all times;Off for dressing/bathing/exercises Precaution Booklet Issued: No Required Braces or Orthoses: Sling Restrictions Weight Bearing Restrictions: Yes RUE Weight Bearing: Non weight bearing      Mobility Bed Mobility Overal bed mobility: Modified Independent                Transfers Overall transfer level: Modified independent                    Balance Overall balance assessment: No apparent balance deficits (not formally assessed)                                         ADL either performed or assessed with  clinical judgement   ADL Overall ADL's : Needs assistance/impaired Eating/Feeding: Independent   Grooming: Independent   Upper Body Bathing: Sitting;Minimal assistance   Lower Body Bathing: Minimal assistance;Sit to/from stand   Upper Body Dressing : Moderate assistance;Cueing for UE precautions   Lower Body Dressing: Set up   Toilet Transfer: Modified Independent   Toileting- Clothing Manipulation and Hygiene: Modified independent       Functional mobility during ADLs: Modified independent       Vision Baseline Vision/History: Wears glasses Wears Glasses: At all times       Perception     Praxis      Pertinent Vitals/Pain Pain Assessment: No/denies pain     Hand Dominance     Extremity/Trunk Assessment Upper Extremity Assessment Upper Extremity Assessment: RUE deficits/detail RUE Deficits / Details: Impaired ROM and strength secondary to effects of interscalene block and shoulder precautions. LImited ability to supinate/pronate, move wrist and fingers.           Communication     Cognition Arousal/Alertness: Awake/alert Behavior During Therapy: WFL for tasks assessed/performed Overall Cognitive Status: Within Functional Limits for tasks assessed                                     General Comments       Exercises     Shoulder Instructions Shoulder Instructions Donning/doffing shirt without moving shoulder: Patient able to independently direct caregiver;Moderate assistance  Method for sponge bathing under operated UE: Patient able to independently direct caregiver;Minimal assistance Donning/doffing sling/immobilizer: Patient able to independently direct caregiver;Moderate assistance Correct positioning of sling/immobilizer: Patient able to independently direct caregiver ROM for elbow, wrist and digits of operated UE: Patient able to independently direct caregiver Sling wearing schedule (on at all times/off for ADL's): Patient able to  independently direct caregiver Proper positioning of operated UE when showering: Patient able to independently direct caregiver Dressing change: Patient able to independently direct caregiver Positioning of UE while sleeping: Patient able to independently direct caregiver    Home Living Family/patient expects to be discharged to:: Private residence Living Arrangements: Spouse/significant other Available Help at Discharge: Family                                    Prior Functioning/Environment                   OT Problem List:        OT Treatment/Interventions:      OT Goals(Current goals can be found in the care plan section) Acute Rehab OT Goals Patient Stated Goal: to go home OT Goal Formulation: All assessment and education complete, DC therapy  OT Frequency:     Barriers to D/C:            Co-evaluation              AM-PAC OT "6 Clicks" Daily Activity     Outcome Measure Help from another person eating meals?: None Help from another person taking care of personal grooming?: None Help from another person toileting, which includes using toliet, bedpan, or urinal?: None Help from another person bathing (including washing, rinsing, drying)?: A Little Help from another person to put on and taking off regular upper body clothing?: A Lot Help from another person to put on and taking off regular lower body clothing?: None 6 Click Score: 21   End of Session Nurse Communication:  (Ot education complete.)  Activity Tolerance: Patient tolerated treatment well Patient left: in chair  OT Visit Diagnosis: Muscle weakness (generalized) (M62.81)                Time: 7829-5621 OT Time Calculation (min): 19 min Charges:  OT General Charges $OT Visit: 1 Visit OT Evaluation $OT Eval Low Complexity: 1 Low  Daquon Greenleaf, OTR/L [place]  Office [phone] Pager: [phone]   Lenward Chancellor 07/29/2020, 4:07 PM

## 2020-07-29 NOTE — Op Note (Signed)
NAME: Miguel Brock, Miguel Brock MEDICAL RECORD [id number] ACCOUNT [id number] DATE OF BIRTH:12-18-1946 FACILITY: [facility] LOCATION: [facility] PHYSICIAN:STEVEN Orlena Sheldon, MD  OPERATIVE REPORT  DATE OF PROCEDURE:  07/28/2020  PREOPERATIVE DIAGNOSIS:  Right shoulder partial thickness rotator cuff tear, chronic impingement, SLAP tear and possible unstable os acromiale.  POSTOPERATIVE DIAGNOSES: 1.  Right shoulder SLAP tear with proximal biceps tendon tear. 2.  Partial thickness rotator cuff tear. 3.  Grade III and grade IV glenoid chondromalacia with chronic impingement syndrome.  No evidence of os instability.  PROCEDURE PERFORMED:  Right shoulder arthroscopy with extensive intra-articular debridement of torn superior labrum anterior to posterior arthroscopic biceps tenotomy followed by arthroscopic debridement of partial thickness rotator cuff tear,  arthroscopic subacromial decompression, assessment of os acromiale stability followed by biceps tenodesis in the groove.  ATTENDING SURGEON:  Bryon Lions, MD  ASSISTANT:  Darol Destine, [place], who was scrubbed during the entire procedure and necessary for satisfactory completion, MD.  ANESTHESIA:  General anesthesia was used plus interscalene block.  ESTIMATED BLOOD LOSS:  Minimal.  FLUID REPLACEMENT:  1500 mL crystalloid.  INSTRUMENT COUNTS:  Correct.  COMPLICATIONS:  No complications.  ANTIBIOTICS:  Perioperative antibiotics were given.  INDICATIONS:  The patient is a 74 year old male who presents with a history of worsening right shoulder pain secondary to chronic impingement syndrome with partial thickness rotator cuff tear and evidence of a labrum tear.  The patient also has an os  acromiale.  There is question of whether or not this os was unstable.  I discussed options for management with the patient and recommended shoulder arthroscopy for debridement of intraarticular pathology, likely biceps tenodesis, possible rotator cuff   repair and possible os acromiale ORIF if unstable.  Risks were discussed with the patient.  Informed consent obtained.  DESCRIPTION OF PROCEDURE:  After adequate level of anesthesia was achieved, the patient was positioned in the modified beach chair position.  Right shoulder correctly identified and sterilely prepped and draped in the usual manner.  Time-out called,  verifying correct patient, correct site.  I did a digital assessment of the patient's os acromiale.  This did not feel grossly unstable to direct palpation.  We went ahead and placed standard arthroscopic portals including anterior, posterior and lateral  portals.  We identified significant proximal biceps tearing about 50% of the biceps in thickness and severe tearing in the patient's labrum starting superiorly and extending down to the posterior inferior and anterior inferior labrum.  We performed a  pan labral debridement, biceps tenotomy and then assessed the subscapularis which had some interstitial split tears, but no detachment.  There was about less than 20% partial thickness undersurface supraspinatus tear, which was debrided using suction  shaver.  This did not appear to be a deep full thickness tear.  Infraspinatus and teres minor looked normal.  There was also noted to be significant chondromalacia on the glenoid, grade III and grade IV anteriorly and inferiorly, loose flaps and  delamination of cartilage.  We debrided that back to a stable articular cartilage with suction shaver.  The subacromial space was entered and we noted there to be severe bursitis.  We performed a bursectomy and a very gentle removal of the reflected  bursa on the underside of the acromion.  I did not want to detach any of the soft tissue connecting the mesoacromion anteriorly to the base of the acromion.  I did use a spinal needle introduced superiorly to identify the os acromiale and then to  localize it  inside the joint.  It still had a very thick soft  tissue connection and as I pushed on that visualizing the anterior mesoacromion, there was no relative motion.  There is evidence of instability.  At this point, we felt comfortable with the  amount of intraarticular pathology that was found that we would not need to do an os ORIF and takedown.  We did a thorough bursectomy.  We inspected the rotator cuff, which showed some bursal-sided fraying, but no significant tears.  I went ahead and  made sure our acromioplasty was limited to anterolaterally only and just well anterior to the os acromiale area and we kept the CA ligament intact for further stability of the anterior acromion.  At this point, we concluded the arthroscopic portion of  the procedure.  I made a small anterior deltoid splitting incision, dissecting down through subcutaneous tissues.  We identified the fat stripe and the deltoid between the anterior and lateral heads of the deltoid split that with a Mayo scissors.  I was  able to place deep retractors, identified the biceps groove.  Delivered the biceps tendon out of the wound, whipstitched that split tendon and basically was butterflied open, so we retubularized it with #2 FiberWire suture.  We then prepped the floor of  the biceps groove, placed an Arthrex double loaded punch anchor through the biceps groove floor and this was an all suture anchor double loaded with ribbon and brought the ribbon up in a double mattress fashion through the reinforced portion of the  biceps tendon.  We then took the longitudinal whipstitch sutures up through the rotator interval and tied over a soft tissue bridge incorporating part of the subscap.  We oversewed the biceps tenodesis site with 0 Vicryl figure-of-eight suture x2.  We  had a nice low profile double fixed tenodesis and were pleased with that on mid tension.  I was able to palpate the rotator cuff and the subscap felt normal.  At this point, we went ahead and irrigated thoroughly.  Repaired the  deltoid anatomically  followed by 2-0 Vicryl for subcutaneous closure and 4-0 Monocryl for skin.  Steri-Strips applied followed by sterile dressing.  The patient tolerated surgery well.  PN/NUANCE  D:07/28/2020 T:07/29/2020 JOB:[id number]

## 2020-07-31 ENCOUNTER — Encounter (HOSPITAL_COMMUNITY): Payer: Self-pay | Admitting: Orthopedic Surgery

## 2020-08-10 ENCOUNTER — Other Ambulatory Visit: Payer: Self-pay | Admitting: Cardiology

## 2020-08-10 DIAGNOSIS — I10 Essential (primary) hypertension: Secondary | ICD-10-CM

## 2020-08-14 DIAGNOSIS — M9904 Segmental and somatic dysfunction of sacral region: Secondary | ICD-10-CM | POA: Diagnosis not present

## 2020-08-15 NOTE — Discharge Summary (Signed)
Orthopedic Discharge Summary        Physician Discharge Summary  Patient ID: HAI GRABE MRN: [id number] DOB/AGE: 1946-12-26 74 y.o.  Admit date: 07/28/2020 Discharge date: 07/29/20   Procedures:  Procedure(s) (LRB): SHOULDER ARTHROSCOPY WITH SUBACROMIAL DECOMPRESSION AND OPEN ROTATOR CUFF REPAIR, OPEN BICEP TENDON REPAIR Open OS acromiale open reduction and internal fixation (Right)  Attending Physician:  Dr. Esmond Plants  Admission Diagnoses:   Right shoulder cuff tear  Discharge Diagnoses:  Right shoulder cuff tear   Past Medical History:  Diagnosis Date  . Alpha galactosidase deficiency   . Anginal pain ([facility])    occ  . Arthritis   . Bursitis of left hip   . Carotid stenosis    Stenosis in the right internal carotid artery (50-69%). Stenosis in the right external carotid artery (<50%).  Stenosis in the left external carotid artery (<50%).  Antegrade right vertebral artery flow. Antegrade left vertebral artery flow.  . Chest pain   . COPD (chronic obstructive pulmonary disease) ([place])   . Coronary artery disease 11,15   stents x7  . Depression   . Diabetes mellitus without complication ([place])    TYPE 2  . Dyspnea   . Essential (primary) hypertension 03/30/2019  . GERD (gastroesophageal reflux disease)    zantac  . H/O hiatal hernia   . Hepatitis 70's   food treatment for it in the 80's  . History of migraine   . Hypertension   . Pneumonia    hx, remote history  . PTSD (post-traumatic stress disorder)   . Trochanteric bursitis of left hip 05/24/2014    PCP: Velna Hatchet, MD   Discharged Condition: good  Hospital Course:  Patient underwent the above stated procedure on 07/28/2020. Patient tolerated the procedure well and brought to the recovery room in good condition and subsequently to the floor. Patient had an uncomplicated hospital course and was stable for discharge.   Disposition: Discharge disposition: 01-Home or Self Care      with follow up  in 2 weeks    Follow-up Information    Netta Cedars, MD. Call in 2 weeks.   Specialty: Orthopedic Surgery Why: [phone] Contact information: [address] [place] [address] [phone]                 Allergies as of 07/29/2020      Reactions   Lisinopril Cough   Other Other (See Comments)   Red meat causes occasional tongue swelling, increase in blood pressure (side effect of previous tick bite - alphagal)   Shellfish Allergy Swelling, Other (See Comments)   Tongue swelling      Medication List    TAKE these medications   acetaminophen 500 MG tablet Commonly known as: TYLENOL Take 500-1,000 mg by mouth every 6 (six) hours as needed (for pain.).   amLODipine 10 MG tablet Commonly known as: NORVASC TAKE 1 TABLET DAILY   aspirin EC 81 MG tablet Take 81 mg by mouth daily.   atorvastatin 40 MG tablet Commonly known as: LIPITOR TAKE 1 TABLET DAILY AFTER A MEAL   cetirizine 10 MG tablet Commonly known as: ZYRTEC Take 10 mg by mouth daily.   chlorthalidone 25 MG tablet Commonly known as: HYGROTON Take 1 tablet (25 mg total) by mouth every morning.   cyclobenzaprine 10 MG tablet Commonly known as: FLEXERIL Take 1 tablet (10 mg total) by mouth 3 (three) times daily as needed for muscle spasms. What changed: when to take  this   docusate sodium 100 MG capsule Commonly known as: COLACE Take 100 mg by mouth daily.   metFORMIN 1000 MG tablet Commonly known as: GLUCOPHAGE Take 1,000 mg by mouth daily with breakfast.   metoprolol succinate 100 MG 24 hr tablet Commonly known as: TOPROL-XL Take 1 tablet (100 mg total) by mouth daily. Take with or immediately following a meal.   nitroGLYCERIN 0.4 MG SL tablet Commonly known as: NITROSTAT Place 1 tablet (0.4 mg total) under the tongue every 5 (five) minutes as needed for chest pain. What changed: when to take this   omeprazole 20 MG capsule Commonly known as: PRILOSEC Take 20 mg by  mouth daily before breakfast.   oxyCODONE-acetaminophen 5-325 MG tablet Commonly known as: Percocet Take 1 tablet by mouth every 6 (six) hours as needed for severe pain.   traMADol 50 MG tablet Commonly known as: ULTRAM Take 50 mg by mouth 2 (two) times daily as needed for pain.         Signed: [place]ura Bruns 08/15/2020, 8:01 AM  [facility] is now [facility] [address], [place], [place],  [address] Phone: [place]

## 2020-08-21 DIAGNOSIS — M25511 Pain in right shoulder: Secondary | ICD-10-CM | POA: Diagnosis not present

## 2020-08-30 DIAGNOSIS — M25511 Pain in right shoulder: Secondary | ICD-10-CM | POA: Diagnosis not present

## 2020-09-14 DIAGNOSIS — Z03818 Encounter for observation for suspected exposure to other biological agents ruled out: Secondary | ICD-10-CM | POA: Diagnosis not present

## 2020-09-14 DIAGNOSIS — Z20822 Contact with and (suspected) exposure to covid-19: Secondary | ICD-10-CM | POA: Diagnosis not present

## 2020-09-25 ENCOUNTER — Other Ambulatory Visit: Payer: Self-pay

## 2020-09-25 MED ORDER — METOPROLOL SUCCINATE ER 100 MG PO TB24: 100.0000 mg | ORAL_TABLET | Freq: Every day | ORAL | 0 refills | Status: DC

## 2020-10-10 DIAGNOSIS — Z4789 Encounter for other orthopedic aftercare: Secondary | ICD-10-CM | POA: Diagnosis not present

## 2020-10-24 DIAGNOSIS — M25511 Pain in right shoulder: Secondary | ICD-10-CM | POA: Diagnosis not present

## 2020-11-05 IMAGING — DX DG SHOULDER 1V*L*
3 series · 3 of 3 positions shown · non-contrast
Comparison: CT 12/18/2017.

CLINICAL DATA: Total left shoulder replacement.

EXAM:
LEFT SHOULDER - 1 VIEW

[shoulder ap (1 of 3)]
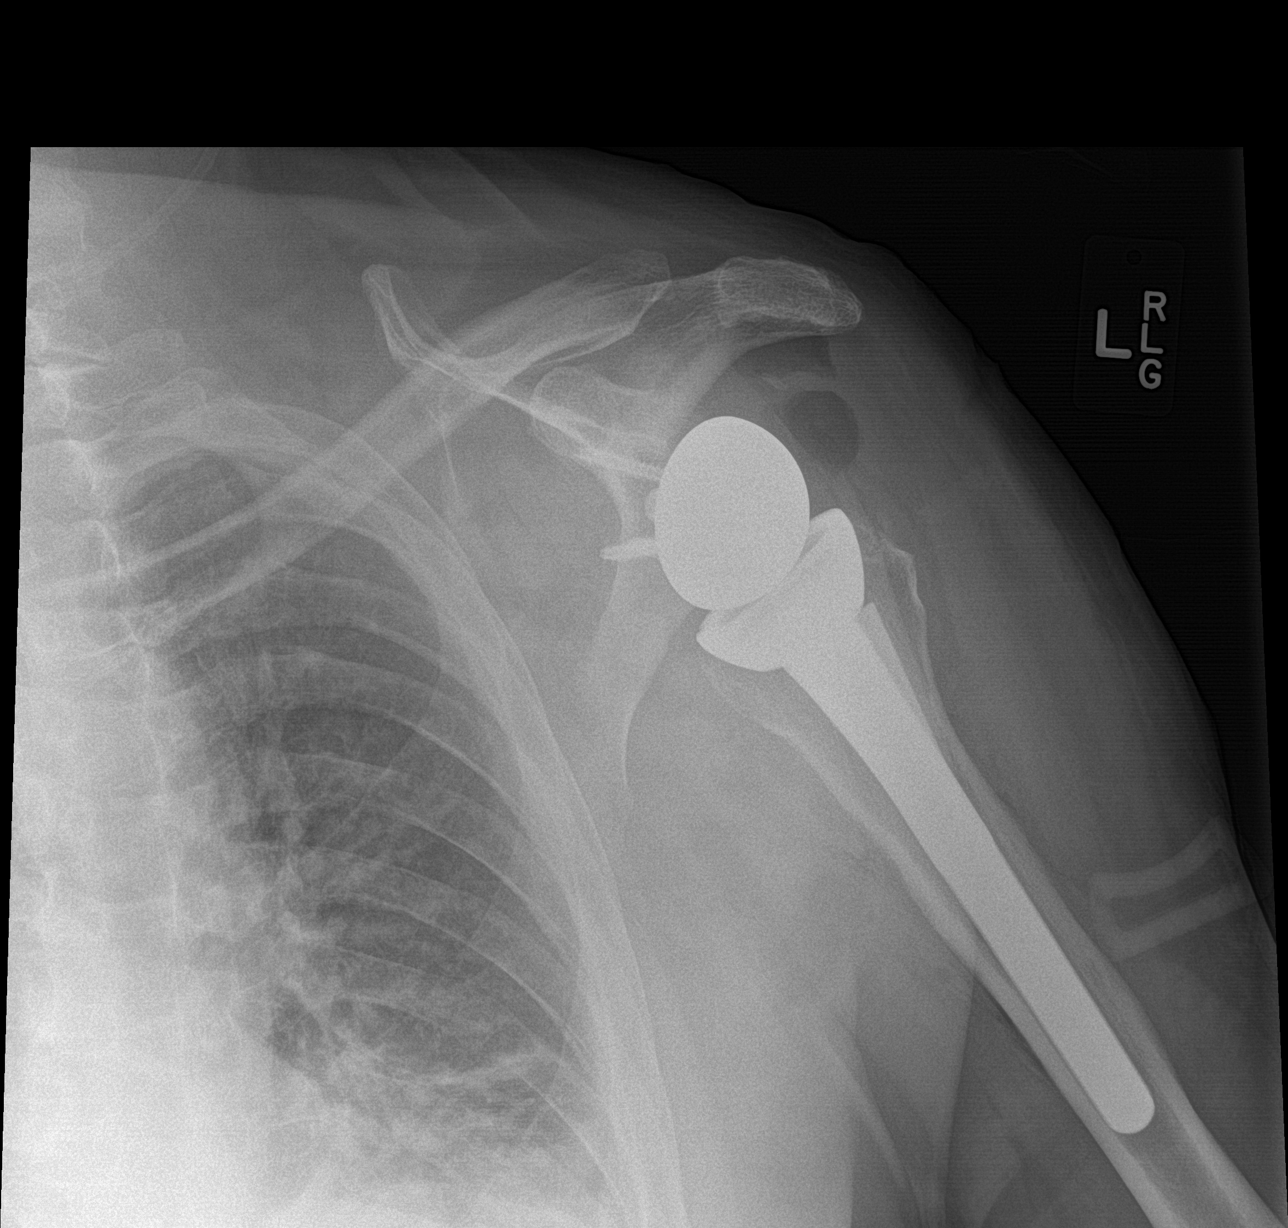

[shoulder ap (2 of 3)]
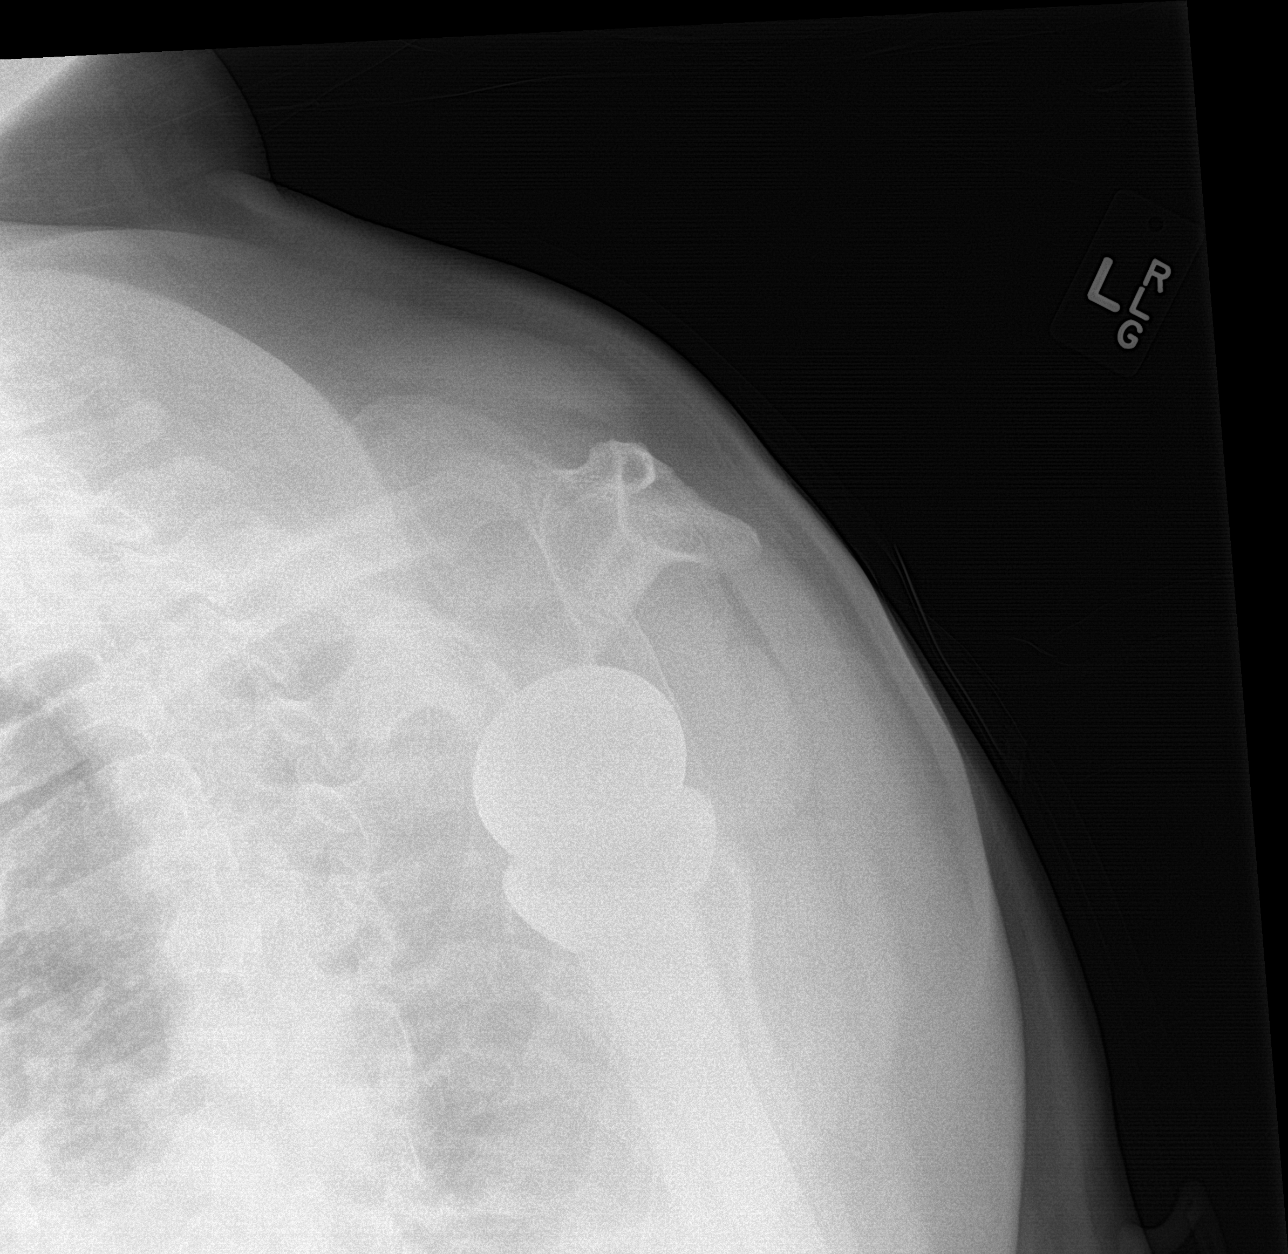

[shoulder ap (3 of 3)]
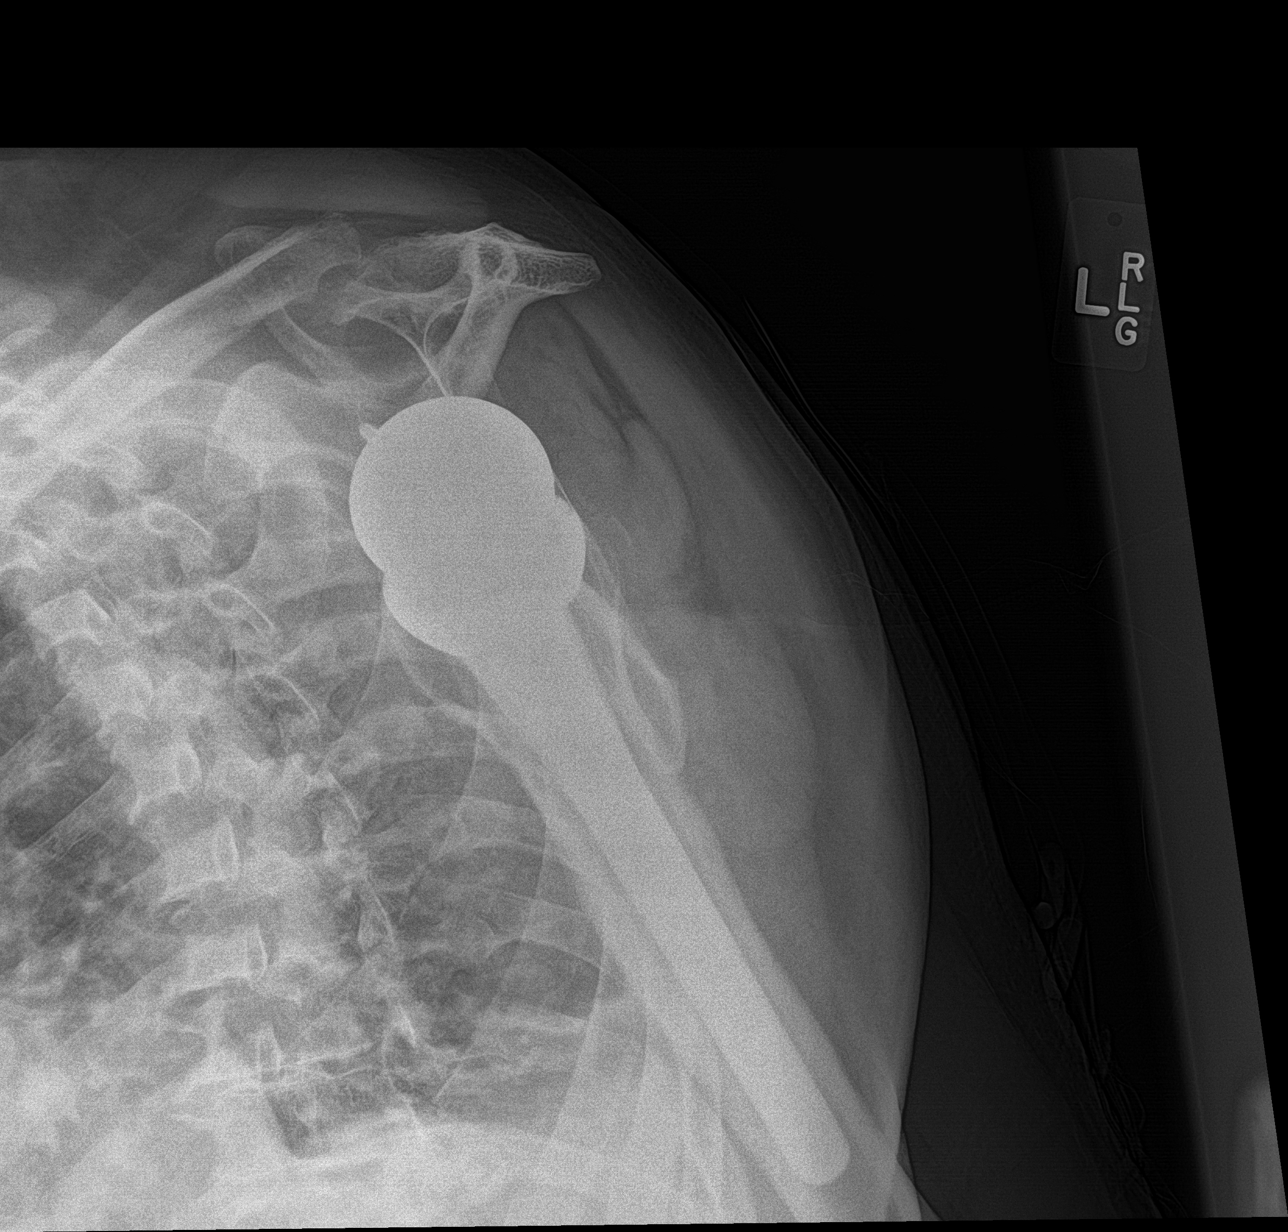

[3 of 3 positions shown; findings below may reference images not displayed]

FINDINGS: Total left shoulder replacement. Hardware intact. Anatomic
alignment.
IMPRESSION: Total left shoulder replacement with anatomic alignment.

## 2020-11-21 DIAGNOSIS — M19111 Post-traumatic osteoarthritis, right shoulder: Secondary | ICD-10-CM | POA: Diagnosis not present

## 2020-11-30 DIAGNOSIS — Z20822 Contact with and (suspected) exposure to covid-19: Secondary | ICD-10-CM | POA: Diagnosis not present

## 2020-11-30 DIAGNOSIS — Z03818 Encounter for observation for suspected exposure to other biological agents ruled out: Secondary | ICD-10-CM | POA: Diagnosis not present
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
## 2020-12-11 ENCOUNTER — Encounter (HOSPITAL_COMMUNITY)
Admission: RE | Admit: 2020-12-11 | Discharge: 2020-12-11 | Disposition: A | Discharge status: Home / Self Care | Payer: Medicare Other | Source: Ambulatory Visit | Attending: Orthopedic Surgery | Admitting: Orthopedic Surgery

## 2020-12-11 ENCOUNTER — Encounter (HOSPITAL_COMMUNITY): Payer: Self-pay

## 2020-12-11 ENCOUNTER — Other Ambulatory Visit: Payer: Self-pay

## 2020-12-11 DIAGNOSIS — A4901 Methicillin susceptible Staphylococcus aureus infection, unspecified site: Secondary | ICD-10-CM | POA: Diagnosis not present

## 2020-12-11 DIAGNOSIS — Z01818 Encounter for other preprocedural examination: Secondary | ICD-10-CM | POA: Insufficient documentation

## 2020-12-11 LAB — BASIC METABOLIC PANEL
Anion gap: 13 (ref 5–15)
BUN: 22 mg/dL (ref 8–23)
CO2: 23 mmol/L (ref 22–32)
Calcium: 9.3 mg/dL (ref 8.9–10.3)
Chloride: 105 mmol/L (ref 98–111)
Creatinine, Ser: 1.77 mg/dL — ABNORMAL HIGH (ref 0.61–1.24)
GFR, Estimated: 40 mL/min — ABNORMAL LOW (ref >60)
Glucose, Bld: 128 mg/dL — ABNORMAL HIGH (ref 70–99)
Potassium: 4.1 mmol/L (ref 3.5–5.1)
Sodium: 141 mmol/L (ref 135–145)

## 2020-12-11 LAB — CBC
HCT: 38.9 % — ABNORMAL LOW (ref 39.0–52.0)
Hemoglobin: 13.2 g/dL (ref 13.0–17.0)
MCH: 33.2 pg (ref 26.0–34.0)
MCHC: 33.9 g/dL (ref 30.0–36.0)
MCV: 98 fL (ref 80.0–100.0)
Platelets: 256 10*3/uL (ref 150–400)
RBC: 3.97 MIL/uL — ABNORMAL LOW (ref 4.22–5.81)
RDW: 12.6 % (ref 11.5–15.5)
WBC: 8.8 10*3/uL (ref 4.0–10.5)
nRBC: 0 % (ref 0.0–0.2)

## 2020-12-11 LAB — SURGICAL PCR SCREEN
MRSA, PCR: NEGATIVE
Staphylococcus aureus: POSITIVE — AB

## 2020-12-11 LAB — HEMOGLOBIN A1C
Hgb A1c MFr Bld: 6.2 % — ABNORMAL HIGH (ref 4.8–5.6)
Mean Plasma Glucose: 131.24 mg/dL

## 2020-12-11 LAB — GLUCOSE, CAPILLARY: Glucose-Capillary: 107 mg/dL — ABNORMAL HIGH (ref 70–99)

## 2020-12-11 NOTE — Progress Notes (Signed)
DUE TO COVID-19 ONLY ONE VISITOR IS ALLOWED TO COME WITH YOU AND STAY IN THE WAITING ROOM ONLY DURING PRE OP AND PROCEDURE DAY OF SURGERY. THE 1 VISITOR  MAY VISIT WITH YOU AFTER SURGERY IN YOUR PRIVATE ROOM DURING VISITING HOURS ONLY!  YOU NEED TO HAVE A COVID 19 TEST ON__12/14/2021 _____ @_______ , THIS TEST MUST BE DONE BEFORE SURGERY,  COVID TESTING SITE [address] [place] [address], IT IS ON THE RIGHT GOING OUT [address] APPROXIMATELY  2 MINUTES PAST [employer] ON THE RIGHT. ONCE YOUR COVID TEST IS COMPLETED,  PLEASE BEGIN THE QUARANTINE INSTRUCTIONS AS OUTLINED IN YOUR HANDOUT.                Miguel Brock  12/11/2020   Your procedure is scheduled on: 12/15/2020    Report to [facility] Main  Entrance   Report to admitting at  0730  AM     Call this number if you have problems the morning of surgery [phone]    REMEMBER: NO  SOLID FOOD CANDY OR GUM AFTER MIDNIGHT. CLEAR LIQUIDS UNTIL  0430AM        . NOTHING BY MOUTH EXCEPT CLEAR LIQUIDS UNTIL    . PLEASE FINISH g2 DRINK PER SURGEON ORDER  WHICH NEEDS TO BE COMPLETED AT0430AM     .      CLEAR LIQUID DIET   Foods Allowed                                                                    Coffee and tea, regular and decaf                            Fruit ices (not with fruit pulp)                                      Iced Popsicles                                    Carbonated beverages, regular and diet                                    Cranberry, grape and apple juices Sports drinks like Gatorade Lightly seasoned clear broth or consume(fat free) Sugar, honey syrup ___________________________________________________________________      BRUSH YOUR TEETH MORNING OF SURGERY AND RINSE YOUR MOUTH OUT, NO CHEWING GUM CANDY OR MINTS.     Take these medicines the morning of surgery with A SIP OF WATER: TOPROL,,, AMLODIPINE, ZYRTEC, PRILOSEC   DO NOT TAKE ANY DIABETIC MEDICATIONS  DAY OF YOUR SURGERY                               You may not have any metal on your body including hair pins and              piercings  Do not  wear jewelry, make-up, lotions, powders or perfumes, deodorant             Do not wear nail polish on your fingernails.  Do not shave  48 hours prior to surgery.              Men may shave face and neck.   Do not bring valuables to the hospital. [facility].  Contacts, dentures or bridgework may not be worn into surgery.  Leave suitcase in the car. After surgery it may be brought to your room.     Patients discharged the day of surgery will not be allowed to drive home. IF YOU ARE HAVING SURGERY AND GOING HOME THE SAME DAY, YOU MUST HAVE AN ADULT TO DRIVE YOU HOME AND BE WITH YOU FOR 24 HOURS. YOU MAY GO HOME BY TAXI OR UBER OR ORTHERWISE, BUT AN ADULT MUST ACCOMPANY YOU HOME AND STAY WITH YOU FOR 24 HOURS.  Name and phone number of your driver:  Special Instructions: N/A              Please read over the following fact sheets you were given: _____________________________________________________________________  [facility] - Preparing for Surgery Before surgery, you can play an important role.  Because skin is not sterile, your skin needs to be as free of germs as possible.  You can reduce the number of germs on your skin by washing with CHG (chlorahexidine gluconate) soap before surgery.  CHG is an antiseptic cleaner which kills germs and bonds with the skin to continue killing germs even after washing. Please DO NOT use if you have an allergy to CHG or antibacterial soaps.  If your skin becomes reddened/irritated stop using the CHG and inform your nurse when you arrive at Short Stay. Do not shave (including legs and underarms) for at least 48 hours prior to the first CHG shower.  You may shave your face/neck. Please follow these instructions carefully:  1.  Shower with CHG Soap the night before  surgery and the  morning of Surgery.  2.  If you choose to wash your hair, wash your hair first as usual with your  normal  shampoo.  3.  After you shampoo, rinse your hair and body thoroughly to remove the  shampoo.                           4.  Use CHG as you would any other liquid soap.  You can apply chg directly  to the skin and wash                       Gently with a scrungie or clean washcloth.  5.  Apply the CHG Soap to your body ONLY FROM THE NECK DOWN.   Do not use on face/ open                           Wound or open sores. Avoid contact with eyes, ears mouth and genitals (private parts).                       Wash face,  Genitals (private parts) with your normal soap.             6.  Wash thoroughly, paying  special attention to the area where your surgery  will be performed.  7.  Thoroughly rinse your body with warm water from the neck down.  8.  DO NOT shower/wash with your normal soap after using and rinsing off  the CHG Soap.                9.  Pat yourself dry with a clean towel.            10.  Wear clean pajamas.            11.  Place clean sheets on your bed the night of your first shower and do not  sleep with pets. Day of Surgery : Do not apply any lotions/deodorants the morning of surgery.  Please wear clean clothes to the hospital/surgery center.  FAILURE TO FOLLOW THESE INSTRUCTIONS MAY RESULT IN THE CANCELLATION OF YOUR SURGERY PATIENT SIGNATURE_________________________________  NURSE SIGNATURE__________________________________  ________________________________________________________________________   [facility]- Preparing for Total Shoulder Arthroplasty    Before surgery, you can play an important role. Because skin is not sterile, your skin needs to be as free of germs as possible. You can reduce the number of germs on your skin by using the following products. . Benzoyl Peroxide Gel o Reduces the number of germs present on the skin o Applied twice a day to  shoulder area starting two days before surgery    ==================================================================  Please follow these instructions carefully:  BENZOYL PEROXIDE 5% GEL  Please do not use if you have an allergy to benzoyl peroxide.   If your skin becomes reddened/irritated stop using the benzoyl peroxide.  Starting two days before surgery, apply as follows: 1. Apply benzoyl peroxide in the morning and at night. Apply after taking a shower. If you are not taking a shower clean entire shoulder front, back, and side along with the armpit with a clean wet washcloth.  2. Place a quarter-sized dollop on your shoulder and rub in thoroughly, making sure to cover the front, back, and side of your shoulder, along with the armpit.   2 days before ____ AM   ____ PM              1 day before ____ AM   ____ PM                         3. Do this twice a day for two days.  (Last application is the night before surgery, AFTER using the CHG soap as described below).  4. Do NOT apply benzoyl peroxide gel on the day of surgery.

## 2020-12-11 NOTE — Progress Notes (Addendum)
Anesthesia Review:  PCP: Dr Hyacinth Meeker- [facility] 05/26/20-epic  Cardiologist : DR Einar Gip [facility] 01/31/2020  Requested clearance from ortho office LVmm for Miguel Brock at office to fax over any clearances.   Chest x-ray : EKG :12/11/2020  Echo :2/220 Stress test: Cardiac Cath : 2020 cxarotid duples-07/10/20  Activity level: can do a flight of stairs without difficulty  Sleep Study/ CPAP : no  Fasting Blood Sugar :      / Checks Blood Sugar -- times a day:   Blood Thinner/ Instructions /Last Dose: ASA / Instructions/ Last Dose :  Checks glucose occasionally per pt  81 mg Aspirin  BMP done 12/11/2020 routed to DR Veverly Fells on 12/11/2020.  HGBA1c-6.2 on 12/11/2020

## 2020-12-12 ENCOUNTER — Other Ambulatory Visit (HOSPITAL_COMMUNITY)
Admission: RE | Admit: 2020-12-12 | Discharge: 2020-12-12 | Disposition: A | Discharge status: Home / Self Care | Payer: Medicare Other | Source: Ambulatory Visit | Attending: Orthopedic Surgery | Admitting: Orthopedic Surgery

## 2020-12-12 DIAGNOSIS — Z20822 Contact with and (suspected) exposure to covid-19: Secondary | ICD-10-CM | POA: Insufficient documentation

## 2020-12-12 DIAGNOSIS — Z01812 Encounter for preprocedural laboratory examination: Secondary | ICD-10-CM | POA: Insufficient documentation

## 2020-12-12 LAB — SARS CORONAVIRUS 2 (TAT 6-24 HRS): SARS Coronavirus 2: NEGATIVE
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
## 2020-12-14 NOTE — Anesthesia Preprocedure Evaluation (Addendum)
Anesthesia Evaluation  Patient identified by MRN, date of birth, ID band Patient awake    Reviewed: Allergy & Precautions, NPO status , Patient's Chart, lab work & pertinent test results  History of Anesthesia Complications Negative for: history of anesthetic complications  Airway Mallampati: II  TM Distance: >3 FB Neck ROM: Full    Dental no notable dental hx. (+) Dental Advisory Given   Pulmonary COPD, former smoker,    Pulmonary exam normal        Cardiovascular hypertension, + CAD, + Cardiac Stents and + Peripheral Vascular Disease  Normal cardiovascular exam Rate:Normal  Normal EF   Neuro/Psych negative neurological ROS  negative psych ROS   GI/Hepatic Neg liver ROS, hiatal hernia, GERD  ,  Endo/Other  negative endocrine ROSdiabetes  Renal/GU negative Renal ROS  negative genitourinary   Musculoskeletal negative musculoskeletal ROS (+)   Abdominal   Peds negative pediatric ROS (+)  Hematology negative hematology ROS (+)   Anesthesia Other Findings   Reproductive/Obstetrics negative OB ROS                            Anesthesia Physical  Anesthesia Plan  ASA: III  Anesthesia Plan: General   Post-op Pain Management:    Induction: Intravenous  PONV Risk Score and Plan: 2 and Ondansetron, Dexamethasone and Treatment may vary due to age or medical condition  Airway Management Planned: Oral ETT  Additional Equipment:   Intra-op Plan:   Post-operative Plan: Extubation in OR  Informed Consent: I have reviewed the patients History and Physical, chart, labs and discussed the procedure including the risks, benefits and alternatives for the proposed anesthesia with the patient or authorized representative who has indicated his/her understanding and acceptance.     Dental advisory given  Plan Discussed with: CRNA and Anesthesiologist  Anesthesia Plan Comments:         Anesthesia Quick Evaluation

## 2020-12-15 ENCOUNTER — Ambulatory Visit (HOSPITAL_COMMUNITY): Payer: Medicare Other | Admitting: Physician Assistant

## 2020-12-15 ENCOUNTER — Ambulatory Visit (HOSPITAL_COMMUNITY): Payer: Medicare Other | Admitting: Anesthesiology

## 2020-12-15 ENCOUNTER — Ambulatory Visit (HOSPITAL_COMMUNITY)
Admission: RE | Admit: 2020-12-15 | Discharge: 2020-12-15 | Disposition: A | Discharge status: Home / Self Care | Payer: Medicare Other | Attending: Orthopedic Surgery | Admitting: Orthopedic Surgery

## 2020-12-15 ENCOUNTER — Encounter (HOSPITAL_COMMUNITY)
Admission: RE | Disposition: A | Discharge status: Home / Self Care | Payer: Self-pay | Source: Home / Self Care | Attending: Orthopedic Surgery

## 2020-12-15 ENCOUNTER — Encounter (HOSPITAL_COMMUNITY): Payer: Self-pay | Admitting: Orthopedic Surgery

## 2020-12-15 DIAGNOSIS — Z7982 Long term (current) use of aspirin: Secondary | ICD-10-CM | POA: Insufficient documentation

## 2020-12-15 DIAGNOSIS — Z981 Arthrodesis status: Secondary | ICD-10-CM | POA: Diagnosis not present

## 2020-12-15 DIAGNOSIS — M75101 Unspecified rotator cuff tear or rupture of right shoulder, not specified as traumatic: Secondary | ICD-10-CM | POA: Insufficient documentation

## 2020-12-15 DIAGNOSIS — J449 Chronic obstructive pulmonary disease, unspecified: Secondary | ICD-10-CM | POA: Insufficient documentation

## 2020-12-15 DIAGNOSIS — M19011 Primary osteoarthritis, right shoulder: Secondary | ICD-10-CM | POA: Insufficient documentation

## 2020-12-15 DIAGNOSIS — I10 Essential (primary) hypertension: Secondary | ICD-10-CM | POA: Insufficient documentation

## 2020-12-15 DIAGNOSIS — Z955 Presence of coronary angioplasty implant and graft: Secondary | ICD-10-CM | POA: Diagnosis not present

## 2020-12-15 DIAGNOSIS — Z888 Allergy status to other drugs, medicaments and biological substances status: Secondary | ICD-10-CM | POA: Diagnosis not present

## 2020-12-15 DIAGNOSIS — Z87891 Personal history of nicotine dependence: Secondary | ICD-10-CM | POA: Diagnosis not present

## 2020-12-15 DIAGNOSIS — E119 Type 2 diabetes mellitus without complications: Secondary | ICD-10-CM | POA: Diagnosis not present

## 2020-12-15 DIAGNOSIS — Z96612 Presence of left artificial shoulder joint: Secondary | ICD-10-CM | POA: Insufficient documentation

## 2020-12-15 DIAGNOSIS — Z91013 Allergy to seafood: Secondary | ICD-10-CM | POA: Insufficient documentation

## 2020-12-15 DIAGNOSIS — Z7984 Long term (current) use of oral hypoglycemic drugs: Secondary | ICD-10-CM | POA: Insufficient documentation

## 2020-12-15 DIAGNOSIS — Z79899 Other long term (current) drug therapy: Secondary | ICD-10-CM | POA: Insufficient documentation

## 2020-12-15 DIAGNOSIS — G8918 Other acute postprocedural pain: Secondary | ICD-10-CM | POA: Diagnosis not present

## 2020-12-15 DIAGNOSIS — I2510 Atherosclerotic heart disease of native coronary artery without angina pectoris: Secondary | ICD-10-CM | POA: Insufficient documentation

## 2020-12-15 DIAGNOSIS — K219 Gastro-esophageal reflux disease without esophagitis: Secondary | ICD-10-CM | POA: Diagnosis not present

## 2020-12-15 DIAGNOSIS — M12811 Other specific arthropathies, not elsewhere classified, right shoulder: Secondary | ICD-10-CM | POA: Diagnosis not present

## 2020-12-15 HISTORY — PX: REVERSE SHOULDER ARTHROPLASTY: SHX5054

## 2020-12-15 LAB — GLUCOSE, CAPILLARY
Glucose-Capillary: 125 mg/dL — ABNORMAL HIGH (ref 70–99)
Glucose-Capillary: 121 mg/dL — ABNORMAL HIGH (ref 70–99)

## 2020-12-15 SURGERY — ARTHROPLASTY, SHOULDER, TOTAL, REVERSE
Anesthesia: General | Site: Shoulder | Laterality: Right

## 2020-12-15 MED ORDER — BUPIVACAINE-EPINEPHRINE (PF) 0.25% -1:200000 IJ SOLN
INTRAMUSCULAR | Status: DC | PRN
  Administered 2020-12-15: 10 mL

## 2020-12-15 MED ORDER — MIDAZOLAM HCL 2 MG/2ML IJ SOLN
1.0000 mg | Freq: Once | INTRAMUSCULAR | Status: AC
  Administered 2020-12-15: 1 mg via INTRAVENOUS
  Filled 2020-12-15: qty 2

## 2020-12-15 MED ORDER — CHLORHEXIDINE GLUCONATE 0.12 % MT SOLN
15.0000 mL | Freq: Once | OROMUCOSAL | Status: AC
  Administered 2020-12-15: 15 mL via OROMUCOSAL

## 2020-12-15 MED ORDER — CYCLOBENZAPRINE HCL 10 MG PO TABS
10.0000 mg | ORAL_TABLET | Freq: Three times a day (TID) | ORAL | 1 refills | Status: AC | PRN
Start: 2020-12-15 — End: ?

## 2020-12-15 MED ORDER — FENTANYL CITRATE (PF) 100 MCG/2ML IJ SOLN
INTRAMUSCULAR | Status: DC | PRN
  Administered 2020-12-15: 25 ug via INTRAVENOUS
  Administered 2020-12-15 (×2): 50 ug via INTRAVENOUS

## 2020-12-15 MED ORDER — PROPOFOL 10 MG/ML IV BOLUS
INTRAVENOUS | Status: DC | PRN
  Administered 2020-12-15: 150 mg via INTRAVENOUS

## 2020-12-15 MED ORDER — LACTATED RINGERS IV SOLN: INTRAVENOUS | Status: DC

## 2020-12-15 MED ORDER — CEPHALEXIN 500 MG PO CAPS: 500.0000 mg | ORAL_CAPSULE | Freq: Four times a day (QID) | ORAL | 0 refills | Status: AC

## 2020-12-15 MED ORDER — METOPROLOL SUCCINATE ER 100 MG PO TB24
100.0000 mg | ORAL_TABLET | Freq: Every day | ORAL | Status: DC
  Administered 2020-12-15: 100 mg via ORAL
  Filled 2020-12-15: qty 1

## 2020-12-15 MED ORDER — ROCURONIUM BROMIDE 100 MG/10ML IV SOLN
INTRAVENOUS | Status: DC | PRN
  Administered 2020-12-15: 100 mg via INTRAVENOUS

## 2020-12-15 MED ORDER — CELECOXIB 200 MG PO CAPS
200.0000 mg | ORAL_CAPSULE | Freq: Once | ORAL | Status: AC
  Administered 2020-12-15: 200 mg via ORAL
  Filled 2020-12-15: qty 1

## 2020-12-15 MED ORDER — OXYCODONE-ACETAMINOPHEN 5-325 MG PO TABS: 1.0000 | ORAL_TABLET | Freq: Four times a day (QID) | ORAL | 0 refills | Status: DC | PRN

## 2020-12-15 MED ORDER — ROCURONIUM BROMIDE 10 MG/ML (PF) SYRINGE
PREFILLED_SYRINGE | INTRAVENOUS | Status: AC
  Filled 2020-12-15: qty 10

## 2020-12-15 MED ORDER — ORAL CARE MOUTH RINSE: 15.0000 mL | Freq: Once | OROMUCOSAL | Status: AC

## 2020-12-15 MED ORDER — PROPOFOL 10 MG/ML IV BOLUS
INTRAVENOUS | Status: AC
  Filled 2020-12-15: qty 20

## 2020-12-15 MED ORDER — ONDANSETRON HCL 4 MG/2ML IJ SOLN
INTRAMUSCULAR | Status: AC
  Filled 2020-12-15: qty 2

## 2020-12-15 MED ORDER — LIDOCAINE HCL (CARDIAC) PF 100 MG/5ML IV SOSY
PREFILLED_SYRINGE | INTRAVENOUS | Status: DC | PRN
  Administered 2020-12-15: 50 mg via INTRAVENOUS

## 2020-12-15 MED ORDER — DEXAMETHASONE SODIUM PHOSPHATE 10 MG/ML IJ SOLN
INTRAMUSCULAR | Status: AC
  Filled 2020-12-15: qty 1

## 2020-12-15 MED ORDER — LIDOCAINE HCL (PF) 2 % IJ SOLN
INTRAMUSCULAR | Status: AC
  Filled 2020-12-15: qty 5

## 2020-12-15 MED ORDER — MIDAZOLAM HCL 2 MG/2ML IJ SOLN
INTRAMUSCULAR | Status: AC
  Filled 2020-12-15: qty 2

## 2020-12-15 MED ORDER — BUPIVACAINE LIPOSOME 1.3 % IJ SUSP
INTRAMUSCULAR | Status: DC | PRN
  Administered 2020-12-15: 10 mL via PERINEURAL

## 2020-12-15 MED ORDER — FENTANYL CITRATE (PF) 250 MCG/5ML IJ SOLN
INTRAMUSCULAR | Status: AC
  Filled 2020-12-15: qty 5

## 2020-12-15 MED ORDER — PHENYLEPHRINE HCL-NACL 10-0.9 MG/250ML-% IV SOLN
INTRAVENOUS | Status: DC | PRN
  Administered 2020-12-15: 50 ug/min via INTRAVENOUS

## 2020-12-15 MED ORDER — MIDAZOLAM HCL 5 MG/5ML IJ SOLN
INTRAMUSCULAR | Status: DC | PRN
  Administered 2020-12-15: 1 mg via INTRAVENOUS

## 2020-12-15 MED ORDER — SODIUM CHLORIDE 0.9 % IR SOLN
Status: DC | PRN
  Administered 2020-12-15: 1000 mL

## 2020-12-15 MED ORDER — GLYCOPYRROLATE 0.2 MG/ML IJ SOLN
INTRAMUSCULAR | Status: DC | PRN
  Administered 2020-12-15 (×2): .1 mg via INTRAVENOUS

## 2020-12-15 MED ORDER — ACETAMINOPHEN 500 MG PO TABS
1000.0000 mg | ORAL_TABLET | Freq: Once | ORAL | Status: AC
  Administered 2020-12-15: 1000 mg via ORAL
  Filled 2020-12-15: qty 2

## 2020-12-15 MED ORDER — PROMETHAZINE HCL 25 MG/ML IJ SOLN
6.2500 mg | INTRAMUSCULAR | Status: DC | PRN
Start: 2020-12-15 — End: 2020-12-15

## 2020-12-15 MED ORDER — SUGAMMADEX SODIUM 200 MG/2ML IV SOLN
INTRAVENOUS | Status: DC | PRN
  Administered 2020-12-15: 200 mg via INTRAVENOUS
  Administered 2020-12-15: 100 mg via INTRAVENOUS

## 2020-12-15 MED ORDER — ONDANSETRON HCL 4 MG/2ML IJ SOLN
INTRAMUSCULAR | Status: DC | PRN
  Administered 2020-12-15: 4 mg via INTRAVENOUS

## 2020-12-15 MED ORDER — CEFAZOLIN SODIUM-DEXTROSE 2-4 GM/100ML-% IV SOLN
2.0000 g | INTRAVENOUS | Status: AC
  Administered 2020-12-15: 2 g via INTRAVENOUS
  Filled 2020-12-15: qty 100

## 2020-12-15 MED ORDER — FENTANYL CITRATE (PF) 100 MCG/2ML IJ SOLN: 25.0000 ug | INTRAMUSCULAR | Status: DC | PRN

## 2020-12-15 MED ORDER — BUPIVACAINE-EPINEPHRINE (PF) 0.25% -1:200000 IJ SOLN
INTRAMUSCULAR | Status: AC
  Filled 2020-12-15: qty 30

## 2020-12-15 MED ORDER — GLYCOPYRROLATE PF 0.2 MG/ML IJ SOSY
PREFILLED_SYRINGE | INTRAMUSCULAR | Status: AC
  Filled 2020-12-15: qty 1

## 2020-12-15 MED ORDER — FENTANYL CITRATE (PF) 100 MCG/2ML IJ SOLN
50.0000 ug | Freq: Once | INTRAMUSCULAR | Status: AC
  Administered 2020-12-15 (×2): 50 ug via INTRAVENOUS
  Filled 2020-12-15: qty 2

## 2020-12-15 MED ORDER — EPHEDRINE SULFATE 50 MG/ML IJ SOLN
INTRAMUSCULAR | Status: DC | PRN
  Administered 2020-12-15 (×4): 10 mg via INTRAVENOUS

## 2020-12-15 MED ORDER — DEXAMETHASONE SODIUM PHOSPHATE 10 MG/ML IJ SOLN
INTRAMUSCULAR | Status: DC | PRN
  Administered 2020-12-15: 8 mg via INTRAVENOUS

## 2020-12-15 MED ORDER — PHENYLEPHRINE HCL (PRESSORS) 10 MG/ML IV SOLN
INTRAVENOUS | Status: AC
  Filled 2020-12-15: qty 1

## 2020-12-15 MED ORDER — BUPIVACAINE HCL (PF) 0.5 % IJ SOLN
INTRAMUSCULAR | Status: DC | PRN
  Administered 2020-12-15: 15 mL via PERINEURAL

## 2020-12-15 SURGICAL SUPPLY — 70 items
BAG ZIPLOCK 12X15 (MISCELLANEOUS) IMPLANT
BIT DRILL 1.6MX128 (BIT) IMPLANT
BIT DRILL 170X2.5X (BIT) ×1 IMPLANT
BIT DRL 170X2.5X (BIT) ×1
BLADE SAG 18X100X1.27 (BLADE) ×2 IMPLANT
COVER BACK TABLE 60X90IN (DRAPES) ×2 IMPLANT
COVER SURGICAL LIGHT HANDLE (MISCELLANEOUS) ×2 IMPLANT
COVER WAND RF STERILE (DRAPES) IMPLANT
DECANTER SPIKE VIAL GLASS SM (MISCELLANEOUS) IMPLANT
DRAPE INCISE IOBAN 66X45 STRL (DRAPES) ×2 IMPLANT
DRAPE ORTHO SPLIT 77X108 STRL (DRAPES) ×4
DRAPE SHEET LG 3/4 BI-LAMINATE (DRAPES) ×2 IMPLANT
DRAPE SURG ORHT 6 SPLT 77X108 (DRAPES) ×2 IMPLANT
DRAPE TOP 10253 STERILE (DRAPES) ×2 IMPLANT
DRAPE U-SHAPE 47X51 STRL (DRAPES) ×2 IMPLANT
DRILL 2.5 (BIT) ×2
DRSG ADAPTIC 3X8 NADH LF (GAUZE/BANDAGES/DRESSINGS) ×2 IMPLANT
DRSG PAD ABDOMINAL 8X10 ST (GAUZE/BANDAGES/DRESSINGS) ×2 IMPLANT
DURAPREP 26ML APPLICATOR (WOUND CARE) ×2 IMPLANT
ELECT BLADE TIP CTD 4 INCH (ELECTRODE) ×2 IMPLANT
ELECT NEEDLE TIP 2.8 STRL (NEEDLE) ×2 IMPLANT
ELECT REM PT RETURN 15FT ADLT (MISCELLANEOUS) ×2 IMPLANT
EPIPHYSI RIGHT SZ 2 (Shoulder) ×2 IMPLANT
EPIPHYSIS RIGHT SZ 2 (Shoulder) ×1 IMPLANT
FACESHIELD WRAPAROUND (MASK) ×2 IMPLANT
GAUZE SPONGE 4X4 12PLY STRL (GAUZE/BANDAGES/DRESSINGS) ×2 IMPLANT
GLENOSPHERE XTEND LAT 42+0 STD (Miscellaneous) ×2 IMPLANT
GLOVE BIOGEL PI ORTHO PRO 7.5 (GLOVE) ×1
GLOVE BIOGEL PI ORTHO PRO SZ8 (GLOVE) ×1
GLOVE ORTHO TXT STRL SZ7.5 (GLOVE) ×2 IMPLANT
GLOVE PI ORTHO PRO STRL 7.5 (GLOVE) ×1 IMPLANT
GLOVE PI ORTHO PRO STRL SZ8 (GLOVE) ×1 IMPLANT
GLOVE SURG ORTHO 8.5 STRL (GLOVE) ×2 IMPLANT
GOWN STRL REUS W/TWL XL LVL3 (GOWN DISPOSABLE) ×4 IMPLANT
KIT BASIN OR (CUSTOM PROCEDURE TRAY) ×2 IMPLANT
KIT TURNOVER KIT A (KITS) ×2 IMPLANT
MANIFOLD NEPTUNE II (INSTRUMENTS) ×2 IMPLANT
METAGLENE DELTA EXTEND (Trauma) ×1 IMPLANT
METAGLENE DXTEND (Trauma) ×2 IMPLANT
NEEDLE MAYO CATGUT SZ4 (NEEDLE) IMPLANT
NS IRRIG 1000ML POUR BTL (IV SOLUTION) ×2 IMPLANT
PACK SHOULDER (CUSTOM PROCEDURE TRAY) ×2 IMPLANT
PENCIL SMOKE EVACUATOR (MISCELLANEOUS) IMPLANT
PIN GUIDE 1.2 (PIN) ×2 IMPLANT
PIN GUIDE GLENOPHERE 1.5MX300M (PIN) ×2 IMPLANT
PIN METAGLENE 2.5 (PIN) ×2 IMPLANT
PROTECTOR NERVE ULNAR (MISCELLANEOUS) ×2 IMPLANT
RESTRAINT HEAD UNIVERSAL NS (MISCELLANEOUS) ×2 IMPLANT
SCREW 4.5X18MM (Screw) ×2 IMPLANT
SCREW 4.5X36MM (Screw) ×2 IMPLANT
SCREW 48L (Screw) ×2 IMPLANT
SCREW BN 18X4.5XSTRL SHLDR (Screw) ×1 IMPLANT
SLING ARM FOAM STRAP LRG (SOFTGOODS) ×2 IMPLANT
SMARTMIX MINI TOWER (MISCELLANEOUS)
SPACER 42 PLUS 6 (Orthopedic Implant) ×2 IMPLANT
SPONGE LAP 4X18 RFD (DISPOSABLE) IMPLANT
STEM 12 HA (Stem) ×2 IMPLANT
STRIP CLOSURE SKIN 1/2X4 (GAUZE/BANDAGES/DRESSINGS) ×2 IMPLANT
SUCTION FRAZIER HANDLE 10FR (MISCELLANEOUS) ×2
SUCTION TUBE FRAZIER 10FR DISP (MISCELLANEOUS) ×1 IMPLANT
SUT FIBERWIRE #2 38 T-5 BLUE (SUTURE) ×4
SUT MNCRL AB 4-0 PS2 18 (SUTURE) ×2 IMPLANT
SUT VIC AB 0 CT1 36 (SUTURE) ×4 IMPLANT
SUT VIC AB 0 CT2 27 (SUTURE) ×2 IMPLANT
SUT VIC AB 2-0 CT1 27 (SUTURE) ×2
SUT VIC AB 2-0 CT1 TAPERPNT 27 (SUTURE) ×1 IMPLANT
SUTURE FIBERWR #2 38 T-5 BLUE (SUTURE) ×2 IMPLANT
TAPE CLOTH 3X10 WHT NS LF (GAUZE/BANDAGES/DRESSINGS) ×2 IMPLANT
TOWEL OR 17X26 10 PK STRL BLUE (TOWEL DISPOSABLE) ×2 IMPLANT
TOWER SMARTMIX MINI (MISCELLANEOUS) IMPLANT

## 2020-12-15 NOTE — Interval H&P Note (Signed)
History and Physical Interval Note:  12/15/2020 9:42 AM  Miguel Brock  has presented today for surgery, with the diagnosis of Right shoulder cuff arthropathy.  The various methods of treatment have been discussed with the patient and family. After consideration of risks, benefits and other options for treatment, the patient has consented to  Procedure(s): REVERSE SHOULDER ARTHROPLASTY (Right) as a surgical intervention.  The patient's history has been reviewed, patient examined, no change in status, stable for surgery.  I have reviewed the patient's chart and labs.  Questions were answered to the patient's satisfaction.     [name]

## 2020-12-15 NOTE — Discharge Instructions (Signed)
Ice to the shoulder constantly.  Keep the incision covered and clean and dry for one week, then ok to get it wet in the shower.  Do exercise as instructed several times per day. Take antibiotic as directed 4 times per day for 5 days to prevent infection   DO NOT reach behind your back or push up out of a chair with the operative arm.  Use a sling while you are up and around for comfort, may remove while seated.  Keep pillow propped behind the operative elbow.  Follow up with Dr Veverly Fells in two weeks in the office, call [phone] for appt

## 2020-12-15 NOTE — Brief Op Note (Signed)
12/15/2020  12:31 PM  PATIENT:  Miguel Brock  74 y.o. male  PRE-OPERATIVE DIAGNOSIS:  Right shoulder cuff arthropathy  POST-OPERATIVE DIAGNOSIS:  Right shoulder cuff arthropathy  PROCEDURE:  Procedure(s): REVERSE SHOULDER ARTHROPLASTY (Right) DePuy Delta xtend, NO subscap repair  SURGEON:  Surgeon(s) and Role:    Netta Cedars, MD - Primary  PHYSICIAN ASSISTANT:   ASSISTANTS: [place] Bruns, PA-C   ANESTHESIA:   regional and general  EBL:  150 mL   BLOOD ADMINISTERED:none  DRAINS: none   LOCAL MEDICATIONS USED:  MARCAINE     SPECIMEN:  No Specimen  DISPOSITION OF SPECIMEN:  N/A  COUNTS:  YES  TOURNIQUET:  * No tourniquets in log *  DICTATION: .Other Dictation: Dictation Number [id number]  PLAN OF CARE: Discharge to home after PACU  PATIENT DISPOSITION:  PACU - hemodynamically stable.   Delay start of Pharmacological VTE agent (>24hrs) due to surgical blood loss or risk of bleeding: not applicable

## 2020-12-15 NOTE — Progress Notes (Signed)
Assisted Dr. Singer with right, ultrasound guided, interscalene  block. Side rails up, monitors on throughout procedure. See vital signs in flow sheet. Tolerated Procedure well. 

## 2020-12-15 NOTE — Anesthesia Procedure Notes (Signed)
Anesthesia Regional Block: Interscalene brachial plexus block   Pre-Anesthetic Checklist: ,, timeout performed, Correct Patient, Correct Site, Correct Laterality, Correct Procedure, Correct Position, site marked, Risks and benefits discussed,  Surgical consent,  Pre-op evaluation,  At surgeon's request and post-op pain management  Laterality: Right  Prep: chloraprep       Needles:  Injection technique: Single-shot  Needle Type: Echogenic Stimulator Needle     Needle Length: 5cm  Needle Gauge: 22     Additional Needles:   Narrative:  Start time: 12/15/2020 8:54 AM End time: 12/15/2020 9:04 AM Injection made incrementally with aspirations every 5 mL.  Performed by: Personally  Anesthesiologist: Duane Boston, MD  Additional Notes: Functioning IV was confirmed and monitors applied.  A 85mm 22ga echogenic arrow stimulator was used. Sterile prep and drape,hand hygiene and sterile gloves were used.Ultrasound guidance: relevant anatomy identified, needle position confirmed, local anesthetic spread visualized around nerve(s)., vascular puncture avoided.  Image printed for medical record.  Negative aspiration and negative test dose prior to incremental administration of local anesthetic. The patient tolerated the procedure well.

## 2020-12-15 NOTE — Anesthesia Postprocedure Evaluation (Signed)
Anesthesia Post Note  Patient: Miguel Brock  Procedure(s) Performed: REVERSE SHOULDER ARTHROPLASTY (Right Shoulder)     Patient location during evaluation: PACU Anesthesia Type: General Level of consciousness: sedated Pain management: pain level controlled Vital Signs Assessment: post-procedure vital signs reviewed and stable Respiratory status: spontaneous breathing and respiratory function stable Cardiovascular status: stable Postop Assessment: no apparent nausea or vomiting Anesthetic complications: no   No complications documented.  Last Vitals:  Vitals:   12/15/20 1300 12/15/20 1315  BP: 118/74 113/70  Pulse: (!) 57 (!) 50  Resp: 12 12  Temp:  36.5 C  SpO2: 93% 95%    Last Pain:  Vitals:   12/15/20 1315  TempSrc:   PainSc: 0-No pain                 Mekala Winger DANIEL

## 2020-12-15 NOTE — Anesthesia Procedure Notes (Signed)
Procedure Name: Intubation Date/Time: 12/15/2020 10:32 AM Performed by: Garrel Ridgel, CRNA Pre-anesthesia Checklist: Patient identified, Emergency Drugs available, Suction available and Patient being monitored Patient Re-evaluated:Patient Re-evaluated prior to induction Oxygen Delivery Method: Circle system utilized Preoxygenation: Pre-oxygenation with 100% oxygen Induction Type: IV induction Ventilation: Mask ventilation without difficulty Laryngoscope Size: Mac and 4 Grade View: Grade I Tube type: Oral Tube size: 7.5 mm Number of attempts: 1 Airway Equipment and Method: Stylet and Oral airway Placement Confirmation: ETT inserted through vocal cords under direct vision,  positive ETCO2 and breath sounds checked- equal and bilateral Secured at: 23 cm Tube secured with: Tape Dental Injury: Teeth and Oropharynx as per pre-operative assessment

## 2020-12-15 NOTE — Transfer of Care (Signed)
Immediate Anesthesia Transfer of Care Note  Patient: ROCKEY GUARINO  Procedure(s) Performed: REVERSE SHOULDER ARTHROPLASTY (Right Shoulder)  Patient Location: PACU  Anesthesia Type:General  Level of Consciousness: awake, alert , oriented and patient cooperative  Airway & Oxygen Therapy: Patient Spontanous Breathing and Patient connected to face mask oxygen  Post-op Assessment: Report given to RN and Post -op Vital signs reviewed and stable  Post vital signs: Reviewed and stable  Last Vitals:  Vitals Value Taken Time  BP 131/71 12/15/20 1219  Temp 36.5 C 12/15/20 1219  Pulse 60 12/15/20 1222  Resp 15 12/15/20 1222  SpO2 98 % 12/15/20 1222  Vitals shown include unvalidated device data.  Last Pain:  Vitals:   12/15/20 0805  TempSrc: Oral  PainSc: 0-No pain         Complications: No complications documented.

## 2020-12-16 NOTE — Op Note (Signed)
NAME: MILEY, LINDON MEDICAL RECORD [id number] ACCOUNT [id number] DATE OF BIRTH:October 03, 1946 FACILITY: [facility] LOCATION: [facility] PHYSICIAN:STEVEN Orlena Sheldon, MD  OPERATIVE REPORT  DATE OF PROCEDURE:  12/15/2020  PREOPERATIVE DIAGNOSIS:  Right shoulder rotator cuff tear arthropathy.  POSTOPERATIVE DIAGNOSIS:  Right shoulder rotator cuff tear arthropathy.  PROCEDURE PERFORMED:  Right reverse total shoulder replacement using DePuy Delta Xtend prosthesis with no subscap repair.  ATTENDING SURGEON:  Esmond Plants, MD  ASSISTANT:  Darol Destine, [place], who was scrubbed during the entire procedure and necessary for satisfactory completion of surgery.  ANESTHESIA:  General anesthesia was used plus interscalene block.  ESTIMATED BLOOD LOSS:  Less than 150 mL.  FLUID REPLACEMENT:  1500 mL crystalloid.  INSTRUMENT COUNTS:  Correct.  COMPLICATIONS:  No complications.  ANTIBIOTICS:  Perioperative antibiotics were given.  INDICATIONS:  The patient is a 74 year old male who presents after history of increasing right shoulder pain and dysfunction.  The patient did have a rotator cuff repair and unfortunately continued to have significant pain and poor function post-surgery.   The patient despite conservative management including injections, therapy, modification of activities, presents complaining of fairly debilitating right shoulder pain and loss of fixed focal mechanics with his shoulder.  Based on his preoperative MRI  scan, the patient had subscapularis atrophy and our feeling is that he has imbalance of his rotator cuff musculature in the shoulder leading to poor function and pain.  We discussed options for treatment and the patient has had a previous reverse  shoulder replacement on the left and he wished to move forward with reverse shoulder on the right.  Risks including but not limited to infection and instability were discussed with the patient.  Informed consent  obtained.  DESCRIPTION OF PROCEDURE:  After an adequate level of anesthesia was achieved, the patient was positioned in modified beach chair position.  Right shoulder correctly identified and sterilely prepped and draped in the usual manner.  Time-out called,  verifying correct patient, correct site.  We entered the patient's shoulder using a standard deltopectoral approach starting at the coracoid process extending down to the anterior humerus.  Dissection down through subcutaneous tissues using Bovie.  We  identified the cephalic vein, took that laterally with the deltoid pectoralis taken medially.  Conjoined tendon identified and retracted medially.  We placed our deep retractors.  We tenodesed the biceps with 0 Vicryl figure-of-eight suture x2.  Next, we  went ahead and released the subscapularis off the lesser tuberosity.  This had advanced tendinopathy and was thin with very little muscle palpable.  We tagged it for protection of the axillary nerve.  We did release the inferior capsule progressively  externally rotating and then released the anterior repaired supraspinatus, repair was completely intact both the supraspinatus and infraspinatus.  Once we released the anterior part of the rotator cuff, we could place our brown retractor and extended and  externally rotate the shoulder delivering the humeral head out of the wound.  We entered the proximal humerus with a 6 mm reamer, reaming up to a size 12.  We then placed our T-handled guide and did our head cut using the head cut guide referencing off  the superior aspect of the humeral head and placed in a cut in 10 degrees of retroversion.  We removed excess osteophytes with a rongeur.  We then subluxed the humerus posteriorly.  We removed the glenoid labrum and capsule.  We removed the cartilage off  the glenoid face with the Cobb elevator.  We  then placed our guide aiming device low on the glenoid and then placed our guide pin bicortical.  We then  reamed for the metaglene baseplate, drilled our central peg hole, did our peripheral hand reaming and  then impacted the metaglene baseplate into position.  We had 48 screw inferiorly, a 36 screw at the base of the coracoid and an 18 screw posteriorly for great baseplate fixation.  We locked the superior and inferior screws.  Next, we placed a 42 standard  glenosphere onto the baseplate.  That gave really good clearance inferiorly.  I did a finger sweep after we had that screwed down to the baseplate and there was no soft tissue caught up in that bearing.  Next, we delivered the humeral head back out of  the wound.  We completed our humeral preparation with the metaphyseal reamer.  We reamed for the 2 right metaphysis.  We then selected the trial implants 12 stem, 2 right, set on the 0 setting and placed in 10 degrees of retroversion.  We impacted that  into the humerus.  We then reduced with a 42+3 poly and although that felt fairly good, we thought we could be a little bit more stable in external rotation with a 42+6 which we did and that tensioned the conjoined a little bit better, so we removed the  trial components from the humeral side, irrigated thoroughly, used available bone graft from the humeral head and impaction grafted the HA coated press-fit stem size 12 with the 2 right metaphysis set on the 0 setting and impacted in 10 degrees of  retroversion.  We had a good stable humeral implant.  We then went ahead and inserted a 42+6 poly and impacted down the humeral tray and reduced the shoulder.  We had a nice stable shoulder throughout all ranges of motion.  Conjoined appropriately  tensioned.  No gapping with external rotation.  At this point, we irrigated thoroughly.  We resected the remnant of the subscap and then went ahead and repaired deltopectoral interval with 0 Vicryl suture followed by 2-0 Vicryl for subcutaneous closure,  4-0 Monocryl for skin.  Steri-Strips applied followed by sterile  dressing.  The patient tolerated surgery well.  HN/NUANCE  D:12/15/2020 T:12/16/2020 JOB:[id number]

## 2020-12-19 ENCOUNTER — Encounter (HOSPITAL_COMMUNITY): Payer: Self-pay | Admitting: Orthopedic Surgery

## 2020-12-27 DIAGNOSIS — Z4789 Encounter for other orthopedic aftercare: Secondary | ICD-10-CM | POA: Diagnosis not present

## 2021-01-04 ENCOUNTER — Encounter: Payer: Self-pay | Admitting: Cardiology

## 2021-01-04 ENCOUNTER — Ambulatory Visit: Payer: Medicare Other | Admitting: Cardiology

## 2021-01-04 ENCOUNTER — Other Ambulatory Visit: Payer: Self-pay

## 2021-01-04 VITALS — BP 127/56 | HR 58 | Resp 16 | Ht 71.0 in | Wt 208.0 lb

## 2021-01-04 DIAGNOSIS — E782 Mixed hyperlipidemia: Secondary | ICD-10-CM

## 2021-01-04 DIAGNOSIS — R55 Syncope and collapse: Secondary | ICD-10-CM

## 2021-01-04 DIAGNOSIS — E118 Type 2 diabetes mellitus with unspecified complications: Secondary | ICD-10-CM

## 2021-01-04 DIAGNOSIS — I2510 Atherosclerotic heart disease of native coronary artery without angina pectoris: Secondary | ICD-10-CM

## 2021-01-04 DIAGNOSIS — I10 Essential (primary) hypertension: Secondary | ICD-10-CM | POA: Diagnosis not present

## 2021-01-04 DIAGNOSIS — R0602 Shortness of breath: Secondary | ICD-10-CM

## 2021-01-04 MED ORDER — METOPROLOL SUCCINATE ER 50 MG PO TB24: 50.0000 mg | ORAL_TABLET | Freq: Every day | ORAL | 3 refills | Status: DC

## 2021-01-04 NOTE — Patient Instructions (Signed)
Reduce the dose of metoprolol succinate from 100 mg to 50 mg daily.  You can break the tablets in half, until you are done, I have sent in for a new prescription for 50 mg to [facility].  Please hold taking atorvastatin which is your cholesterol medicine just for 10 days and restart after 10 days.  Do not hold it for more than 10 days.  Please obtain lab work tomorrow on a fasting state after having fasted for 6 hours.  Go to [facility] for the labs.  See back in the office in 6 weeks.
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
## 2021-01-09 LAB — CBC
Hematocrit: 36.9 % — ABNORMAL LOW (ref 37.5–51.0)
Hemoglobin: 12.6 g/dL — ABNORMAL LOW (ref 13.0–17.7)
MCH: 31.8 pg (ref 26.6–33.0)
MCHC: 34.1 g/dL (ref 31.5–35.7)
MCV: 93 fL (ref 79–97)
Platelets: 335 10*3/uL (ref 150–450)
RBC: 3.96 x10E6/uL — ABNORMAL LOW (ref 4.14–5.80)
RDW: 11.8 % (ref 11.6–15.4)
WBC: 8.3 10*3/uL (ref 3.4–10.8)

## 2021-01-09 LAB — CMP14+EGFR
ALT: 15 IU/L (ref 0–44)
AST: 13 IU/L (ref 0–40)
Albumin/Globulin Ratio: 1.5 (ref 1.2–2.2)
Albumin: 4.3 g/dL (ref 3.7–4.7)
Alkaline Phosphatase: 115 IU/L (ref 44–121)
BUN/Creatinine Ratio: 15 (ref 10–24)
BUN: 25 mg/dL (ref 8–27)
Bilirubin Total: 0.3 mg/dL (ref 0.0–1.2)
CO2: 21 mmol/L (ref 20–29)
Calcium: 10.5 mg/dL — ABNORMAL HIGH (ref 8.6–10.2)
Chloride: 103 mmol/L (ref 96–106)
Creatinine, Ser: 1.65 mg/dL — ABNORMAL HIGH (ref 0.76–1.27)
GFR calc Af Amer: 46 mL/min/{1.73_m2} — ABNORMAL LOW (ref >59)
GFR calc non Af Amer: 40 mL/min/{1.73_m2} — ABNORMAL LOW (ref >59)
Globulin, Total: 2.8 g/dL (ref 1.5–4.5)
Glucose: 122 mg/dL — ABNORMAL HIGH (ref 65–99)
Potassium: 4.3 mmol/L (ref 3.5–5.2)
Sodium: 141 mmol/L (ref 134–144)
Total Protein: 7.1 g/dL (ref 6.0–8.5)

## 2021-01-09 LAB — LIPID PANEL WITH LDL/HDL RATIO
Cholesterol, Total: 193 mg/dL (ref 100–199)
HDL: 46 mg/dL (ref >39)
LDL Chol Calc (NIH): 112 mg/dL — ABNORMAL HIGH (ref 0–99)
LDL/HDL Ratio: 2.4 ratio (ref 0.0–3.6)
Triglycerides: 203 mg/dL — ABNORMAL HIGH (ref 0–149)
VLDL Cholesterol Cal: 35 mg/dL (ref 5–40)

## 2021-01-09 LAB — HGB A1C W/O EAG: Hgb A1c MFr Bld: 6.4 % — ABNORMAL HIGH (ref 4.8–5.6)

## 2021-01-09 LAB — TSH: TSH: 5.92 u[IU]/mL — ABNORMAL HIGH (ref 0.450–4.500)

## 2021-01-09 LAB — LIPOPROTEIN A (LPA): Lipoprotein (a): <8.4 nmol/L (ref <75.0)

## 2021-01-11 ENCOUNTER — Ambulatory Visit: Payer: Medicare Other

## 2021-01-11 ENCOUNTER — Other Ambulatory Visit: Payer: Self-pay

## 2021-01-11 DIAGNOSIS — I6523 Occlusion and stenosis of bilateral carotid arteries: Secondary | ICD-10-CM | POA: Diagnosis not present

## 2021-01-14 ENCOUNTER — Other Ambulatory Visit: Payer: Self-pay | Admitting: Cardiology

## 2021-01-14 DIAGNOSIS — I6523 Occlusion and stenosis of bilateral carotid arteries: Secondary | ICD-10-CM

## 2021-01-30 DIAGNOSIS — Z4789 Encounter for other orthopedic aftercare: Secondary | ICD-10-CM | POA: Diagnosis not present

## 2021-01-31 ENCOUNTER — Ambulatory Visit: Payer: Medicare Other | Admitting: Cardiology

## 2021-02-12 ENCOUNTER — Other Ambulatory Visit: Payer: Self-pay | Admitting: Cardiology

## 2021-02-12 DIAGNOSIS — I10 Essential (primary) hypertension: Secondary | ICD-10-CM

## 2021-02-15 ENCOUNTER — Other Ambulatory Visit: Payer: Self-pay

## 2021-02-15 ENCOUNTER — Ambulatory Visit: Payer: Medicare Other | Admitting: Cardiology

## 2021-02-15 ENCOUNTER — Encounter: Payer: Self-pay | Admitting: Cardiology

## 2021-02-15 VITALS — BP 116/60 | HR 64 | Temp 98.0°F | Resp 17 | Ht 71.0 in | Wt 208.8 lb

## 2021-02-15 DIAGNOSIS — I6523 Occlusion and stenosis of bilateral carotid arteries: Secondary | ICD-10-CM | POA: Diagnosis not present

## 2021-02-15 DIAGNOSIS — E782 Mixed hyperlipidemia: Secondary | ICD-10-CM | POA: Diagnosis not present

## 2021-02-15 DIAGNOSIS — R7989 Other specified abnormal findings of blood chemistry: Secondary | ICD-10-CM | POA: Diagnosis not present

## 2021-02-15 DIAGNOSIS — I10 Essential (primary) hypertension: Secondary | ICD-10-CM | POA: Diagnosis not present

## 2021-02-15 DIAGNOSIS — I2510 Atherosclerotic heart disease of native coronary artery without angina pectoris: Secondary | ICD-10-CM

## 2021-02-15 DIAGNOSIS — N1832 Chronic kidney disease, stage 3b: Secondary | ICD-10-CM

## 2021-02-15 DIAGNOSIS — E119 Type 2 diabetes mellitus without complications: Secondary | ICD-10-CM | POA: Insufficient documentation

## 2021-02-15 DIAGNOSIS — E1122 Type 2 diabetes mellitus with diabetic chronic kidney disease: Secondary | ICD-10-CM | POA: Diagnosis not present

## 2021-02-15 DIAGNOSIS — R42 Dizziness and giddiness: Secondary | ICD-10-CM

## 2021-02-15 MED ORDER — EZETIMIBE 10 MG PO TABS: 10.0000 mg | ORAL_TABLET | Freq: Every day | ORAL | 3 refills | Status: DC
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
## 2021-02-23 ENCOUNTER — Ambulatory Visit: Payer: Medicare Other | Admitting: Cardiology

## 2021-03-13 DIAGNOSIS — Z03818 Encounter for observation for suspected exposure to other biological agents ruled out: Secondary | ICD-10-CM | POA: Diagnosis not present

## 2021-03-13 DIAGNOSIS — Z20822 Contact with and (suspected) exposure to covid-19: Secondary | ICD-10-CM | POA: Diagnosis not present

## 2021-03-27 DIAGNOSIS — Z4789 Encounter for other orthopedic aftercare: Secondary | ICD-10-CM | POA: Diagnosis not present

## 2021-03-28 DIAGNOSIS — E119 Type 2 diabetes mellitus without complications: Secondary | ICD-10-CM | POA: Diagnosis not present

## 2021-03-28 DIAGNOSIS — J439 Emphysema, unspecified: Secondary | ICD-10-CM | POA: Diagnosis not present

## 2021-03-28 DIAGNOSIS — Z13228 Encounter for screening for other metabolic disorders: Secondary | ICD-10-CM | POA: Diagnosis not present

## 2021-03-28 DIAGNOSIS — E785 Hyperlipidemia, unspecified: Secondary | ICD-10-CM | POA: Diagnosis not present

## 2021-03-28 DIAGNOSIS — Z125 Encounter for screening for malignant neoplasm of prostate: Secondary | ICD-10-CM | POA: Diagnosis not present

## 2021-03-28 DIAGNOSIS — Z13220 Encounter for screening for lipoid disorders: Secondary | ICD-10-CM | POA: Diagnosis not present

## 2021-03-28 DIAGNOSIS — I10 Essential (primary) hypertension: Secondary | ICD-10-CM | POA: Diagnosis not present

## 2021-03-28 DIAGNOSIS — I519 Heart disease, unspecified: Secondary | ICD-10-CM | POA: Diagnosis not present

## 2021-03-28 DIAGNOSIS — R946 Abnormal results of thyroid function studies: Secondary | ICD-10-CM | POA: Diagnosis not present

## 2021-03-28 DIAGNOSIS — R7989 Other specified abnormal findings of blood chemistry: Secondary | ICD-10-CM | POA: Diagnosis not present

## 2021-04-24 ENCOUNTER — Other Ambulatory Visit: Payer: Self-pay | Admitting: Orthopedic Surgery

## 2021-04-24 DIAGNOSIS — M259 Joint disorder, unspecified: Secondary | ICD-10-CM

## 2021-04-24 DIAGNOSIS — M5459 Other low back pain: Secondary | ICD-10-CM | POA: Diagnosis not present

## 2021-05-03 DIAGNOSIS — R109 Unspecified abdominal pain: Secondary | ICD-10-CM | POA: Diagnosis not present

## 2021-05-07 ENCOUNTER — Ambulatory Visit
Admission: RE | Admit: 2021-05-07 | Discharge: 2021-05-07 | Disposition: A | Discharge status: Home / Self Care | Payer: Medicare Other | Source: Ambulatory Visit | Attending: Orthopedic Surgery | Admitting: Orthopedic Surgery

## 2021-05-07 ENCOUNTER — Other Ambulatory Visit: Payer: Self-pay

## 2021-05-07 DIAGNOSIS — M259 Joint disorder, unspecified: Secondary | ICD-10-CM

## 2021-05-07 DIAGNOSIS — M79651 Pain in right thigh: Secondary | ICD-10-CM | POA: Diagnosis not present

## 2021-05-19 DIAGNOSIS — R109 Unspecified abdominal pain: Secondary | ICD-10-CM | POA: Diagnosis not present

## 2021-05-21 DIAGNOSIS — M9904 Segmental and somatic dysfunction of sacral region: Secondary | ICD-10-CM | POA: Diagnosis not present

## 2021-05-29 DIAGNOSIS — M533 Sacrococcygeal disorders, not elsewhere classified: Secondary | ICD-10-CM | POA: Insufficient documentation

## 2021-05-31 DIAGNOSIS — M79604 Pain in right leg: Secondary | ICD-10-CM | POA: Diagnosis not present

## 2021-05-31 DIAGNOSIS — M5459 Other low back pain: Secondary | ICD-10-CM | POA: Diagnosis not present

## 2021-06-06 DIAGNOSIS — M533 Sacrococcygeal disorders, not elsewhere classified: Secondary | ICD-10-CM | POA: Diagnosis not present

## 2021-06-12 DIAGNOSIS — M545 Low back pain, unspecified: Secondary | ICD-10-CM | POA: Diagnosis not present

## 2021-06-13 ENCOUNTER — Other Ambulatory Visit: Payer: Self-pay | Admitting: Orthopedic Surgery

## 2021-06-13 DIAGNOSIS — M259 Joint disorder, unspecified: Secondary | ICD-10-CM

## 2021-06-29 ENCOUNTER — Ambulatory Visit
Admission: RE | Admit: 2021-06-29 | Discharge: 2021-06-29 | Disposition: A | Discharge status: Home / Self Care | Payer: Medicare Other | Source: Ambulatory Visit | Attending: Orthopedic Surgery | Admitting: Orthopedic Surgery

## 2021-06-29 ENCOUNTER — Other Ambulatory Visit: Payer: Self-pay

## 2021-06-29 DIAGNOSIS — M259 Joint disorder, unspecified: Secondary | ICD-10-CM

## 2021-06-29 DIAGNOSIS — M533 Sacrococcygeal disorders, not elsewhere classified: Secondary | ICD-10-CM | POA: Diagnosis not present

## 2021-06-29 MED ORDER — METHYLPREDNISOLONE ACETATE 40 MG/ML INJ SUSP (RADIOLOG
80.0000 mg | Freq: Once | INTRAMUSCULAR | Status: AC
  Administered 2021-06-29: 80 mg via INTRA_ARTICULAR

## 2021-07-01 ENCOUNTER — Other Ambulatory Visit: Payer: Self-pay | Admitting: Cardiology

## 2021-07-01 DIAGNOSIS — I2510 Atherosclerotic heart disease of native coronary artery without angina pectoris: Secondary | ICD-10-CM

## 2021-07-01 DIAGNOSIS — E782 Mixed hyperlipidemia: Secondary | ICD-10-CM

## 2021-07-01 DIAGNOSIS — R7989 Other specified abnormal findings of blood chemistry: Secondary | ICD-10-CM

## 2021-07-04 ENCOUNTER — Ambulatory Visit (HOSPITAL_BASED_OUTPATIENT_CLINIC_OR_DEPARTMENT_OTHER): Payer: Medicare Other | Admitting: Physical Therapy

## 2021-07-05 DIAGNOSIS — Z4789 Encounter for other orthopedic aftercare: Secondary | ICD-10-CM | POA: Diagnosis not present

## 2021-07-07 ENCOUNTER — Other Ambulatory Visit: Payer: Self-pay | Admitting: Cardiology

## 2021-07-09 ENCOUNTER — Ambulatory Visit (HOSPITAL_BASED_OUTPATIENT_CLINIC_OR_DEPARTMENT_OTHER): Payer: Medicare Other | Attending: Orthopedic Surgery | Admitting: Physical Therapy

## 2021-07-09 ENCOUNTER — Other Ambulatory Visit: Payer: Self-pay

## 2021-07-09 ENCOUNTER — Telehealth: Payer: Self-pay

## 2021-07-09 ENCOUNTER — Encounter (HOSPITAL_BASED_OUTPATIENT_CLINIC_OR_DEPARTMENT_OTHER): Payer: Self-pay | Admitting: Physical Therapy

## 2021-07-09 DIAGNOSIS — M6281 Muscle weakness (generalized): Secondary | ICD-10-CM | POA: Diagnosis not present

## 2021-07-09 DIAGNOSIS — M545 Low back pain, unspecified: Secondary | ICD-10-CM | POA: Insufficient documentation

## 2021-07-09 MED ORDER — METFORMIN HCL 1000 MG PO TABS: 500.0000 mg | ORAL_TABLET | ORAL | 3 refills | Status: DC

## 2021-07-09 NOTE — Telephone Encounter (Signed)
Patient called and stated that he no longer sees the provider who fills his metformin. He would like to know if we can start refilling it for him.

## 2021-07-09 NOTE — Telephone Encounter (Signed)
Please contact patient to see if he would like a referral. Refill has been sent in.
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
## 2021-07-09 NOTE — Telephone Encounter (Signed)
Welcome back. So happy for [place]!  Sure please, We can refer him to see Dr. Baird Cancer, Bailey Mech or anyone in her group

## 2021-07-10 ENCOUNTER — Other Ambulatory Visit: Payer: Self-pay | Admitting: Cardiology

## 2021-07-10 ENCOUNTER — Other Ambulatory Visit: Payer: Self-pay

## 2021-07-10 ENCOUNTER — Ambulatory Visit: Payer: Medicare Other

## 2021-07-10 DIAGNOSIS — I6523 Occlusion and stenosis of bilateral carotid arteries: Secondary | ICD-10-CM

## 2021-07-10 MED ORDER — CHLORTHALIDONE 25 MG PO TABS: 25.0000 mg | ORAL_TABLET | Freq: Every morning | ORAL | 3 refills | Status: DC

## 2021-07-10 MED ORDER — METFORMIN HCL 1000 MG PO TABS: 500.0000 mg | ORAL_TABLET | ORAL | 3 refills | Status: DC

## 2021-07-10 NOTE — Telephone Encounter (Signed)
He has an appointment with me soon, I will discuss the  medications, they were Rx in 2021.

## 2021-07-10 NOTE — Telephone Encounter (Signed)
Patient came in and stated that you would refill his medications from other providers since he does not have a PCP at the moment. Rise Paganini is sending a referal for a new one. Pt would like for [place] to send in Rx for Tramadol 50mg  and Flexeril 10mg . Can I send those in?

## 2021-07-11 ENCOUNTER — Ambulatory Visit (HOSPITAL_BASED_OUTPATIENT_CLINIC_OR_DEPARTMENT_OTHER): Payer: Medicare Other | Admitting: Physical Therapy

## 2021-07-11 NOTE — Telephone Encounter (Signed)
Attempted to call pt, no answer. Left vm requesting call back.

## 2021-07-12 ENCOUNTER — Encounter (HOSPITAL_BASED_OUTPATIENT_CLINIC_OR_DEPARTMENT_OTHER): Payer: Self-pay | Admitting: Physical Therapy

## 2021-07-12 ENCOUNTER — Other Ambulatory Visit: Payer: Self-pay

## 2021-07-12 ENCOUNTER — Ambulatory Visit (HOSPITAL_BASED_OUTPATIENT_CLINIC_OR_DEPARTMENT_OTHER): Payer: Medicare Other | Admitting: Physical Therapy

## 2021-07-12 DIAGNOSIS — M6281 Muscle weakness (generalized): Secondary | ICD-10-CM

## 2021-07-12 DIAGNOSIS — E11319 Type 2 diabetes mellitus with unspecified diabetic retinopathy without macular edema: Secondary | ICD-10-CM | POA: Insufficient documentation

## 2021-07-12 DIAGNOSIS — M545 Low back pain, unspecified: Secondary | ICD-10-CM

## 2021-07-12 MED ORDER — METFORMIN HCL 1000 MG PO TABS: 500.0000 mg | ORAL_TABLET | ORAL | 3 refills | Status: DC
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
## 2021-07-16 DIAGNOSIS — M150 Primary generalized (osteo)arthritis: Secondary | ICD-10-CM | POA: Diagnosis not present

## 2021-07-17 NOTE — Progress Notes (Signed)
Carotid artery duplex 07/10/2021: Duplex suggests stenosis in the right internal carotid artery (50-69%), Upper end of the spectrum. Duplex suggests stenosis in the right external carotid artery (<50%). Duplex suggests stenosis in the left internal carotid artery (1-15%). Antegrade right vertebral artery flow. Antegrade left vertebral artery flow. No significant change since 01/11/2021. Follow up in six months is appropriate if clinically indicated.

## 2021-07-18 ENCOUNTER — Encounter (HOSPITAL_BASED_OUTPATIENT_CLINIC_OR_DEPARTMENT_OTHER): Payer: Self-pay | Admitting: Physical Therapy

## 2021-07-18 ENCOUNTER — Ambulatory Visit (HOSPITAL_BASED_OUTPATIENT_CLINIC_OR_DEPARTMENT_OTHER): Payer: Medicare Other | Admitting: Physical Therapy

## 2021-07-18 ENCOUNTER — Other Ambulatory Visit: Payer: Self-pay

## 2021-07-18 DIAGNOSIS — M545 Low back pain, unspecified: Secondary | ICD-10-CM

## 2021-07-18 DIAGNOSIS — M6281 Muscle weakness (generalized): Secondary | ICD-10-CM | POA: Diagnosis not present
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
## 2021-07-23 ENCOUNTER — Other Ambulatory Visit: Payer: Medicare Other

## 2021-07-24 ENCOUNTER — Other Ambulatory Visit: Payer: Medicare Other

## 2021-08-02 ENCOUNTER — Encounter: Payer: Self-pay | Admitting: Cardiology

## 2021-08-02 ENCOUNTER — Ambulatory Visit: Payer: Medicare Other | Admitting: Cardiology

## 2021-08-02 ENCOUNTER — Other Ambulatory Visit: Payer: Self-pay

## 2021-08-02 VITALS — BP 110/62 | HR 60 | Temp 98.4°F | Resp 16 | Ht 71.0 in | Wt 204.0 lb

## 2021-08-02 DIAGNOSIS — I10 Essential (primary) hypertension: Secondary | ICD-10-CM

## 2021-08-02 DIAGNOSIS — I2510 Atherosclerotic heart disease of native coronary artery without angina pectoris: Secondary | ICD-10-CM

## 2021-08-02 DIAGNOSIS — E1122 Type 2 diabetes mellitus with diabetic chronic kidney disease: Secondary | ICD-10-CM | POA: Diagnosis not present

## 2021-08-02 DIAGNOSIS — E782 Mixed hyperlipidemia: Secondary | ICD-10-CM

## 2021-08-02 DIAGNOSIS — I6523 Occlusion and stenosis of bilateral carotid arteries: Secondary | ICD-10-CM

## 2021-08-02 DIAGNOSIS — N1832 Chronic kidney disease, stage 3b: Secondary | ICD-10-CM

## 2021-08-02 MED ORDER — ATORVASTATIN CALCIUM 80 MG PO TABS: 80.0000 mg | ORAL_TABLET | Freq: Every day | ORAL | 3 refills | Status: DC
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
## 2021-08-10 DIAGNOSIS — M259 Joint disorder, unspecified: Secondary | ICD-10-CM | POA: Diagnosis not present

## 2021-08-10 DIAGNOSIS — M9904 Segmental and somatic dysfunction of sacral region: Secondary | ICD-10-CM | POA: Diagnosis not present

## 2021-08-13 ENCOUNTER — Other Ambulatory Visit: Payer: Self-pay | Admitting: Orthopedic Surgery

## 2021-08-13 DIAGNOSIS — M259 Joint disorder, unspecified: Secondary | ICD-10-CM

## 2021-08-30 ENCOUNTER — Ambulatory Visit
Admission: RE | Admit: 2021-08-30 | Discharge: 2021-08-30 | Disposition: A | Discharge status: Home / Self Care | Payer: Medicare Other | Source: Ambulatory Visit | Attending: Orthopedic Surgery | Admitting: Orthopedic Surgery

## 2021-08-30 ENCOUNTER — Other Ambulatory Visit: Payer: Self-pay

## 2021-08-30 DIAGNOSIS — M259 Joint disorder, unspecified: Secondary | ICD-10-CM

## 2021-08-30 DIAGNOSIS — M533 Sacrococcygeal disorders, not elsewhere classified: Secondary | ICD-10-CM | POA: Diagnosis not present

## 2021-08-30 MED ORDER — METHYLPREDNISOLONE ACETATE 40 MG/ML INJ SUSP (RADIOLOG
80.0000 mg | Freq: Once | INTRAMUSCULAR | Status: AC
  Administered 2021-08-30: 80 mg via INTRA_ARTICULAR

## 2021-09-25 ENCOUNTER — Other Ambulatory Visit: Payer: Self-pay | Admitting: Cardiology

## 2021-09-25 DIAGNOSIS — I10 Essential (primary) hypertension: Secondary | ICD-10-CM

## 2021-09-27 ENCOUNTER — Encounter: Payer: Self-pay | Admitting: Gastroenterology

## 2021-10-22 DIAGNOSIS — M545 Low back pain, unspecified: Secondary | ICD-10-CM | POA: Diagnosis not present

## 2021-10-30 DIAGNOSIS — R102 Pelvic and perineal pain: Secondary | ICD-10-CM | POA: Diagnosis not present

## 2021-10-30 DIAGNOSIS — M545 Low back pain, unspecified: Secondary | ICD-10-CM | POA: Diagnosis not present

## 2021-10-30 DIAGNOSIS — M533 Sacrococcygeal disorders, not elsewhere classified: Secondary | ICD-10-CM | POA: Diagnosis not present

## 2021-10-30 DIAGNOSIS — R1030 Lower abdominal pain, unspecified: Secondary | ICD-10-CM | POA: Insufficient documentation

## 2021-12-03 DIAGNOSIS — M9904 Segmental and somatic dysfunction of sacral region: Secondary | ICD-10-CM | POA: Diagnosis not present

## 2021-12-03 DIAGNOSIS — M545 Low back pain, unspecified: Secondary | ICD-10-CM | POA: Diagnosis not present

## 2021-12-03 DIAGNOSIS — M25551 Pain in right hip: Secondary | ICD-10-CM | POA: Diagnosis not present

## 2021-12-04 DIAGNOSIS — M545 Low back pain, unspecified: Secondary | ICD-10-CM | POA: Diagnosis not present

## 2021-12-12 ENCOUNTER — Other Ambulatory Visit: Payer: Self-pay | Admitting: Cardiology

## 2021-12-12 DIAGNOSIS — I10 Essential (primary) hypertension: Secondary | ICD-10-CM

## 2022-01-23 ENCOUNTER — Other Ambulatory Visit: Payer: Self-pay | Admitting: Cardiology

## 2022-01-23 DIAGNOSIS — E782 Mixed hyperlipidemia: Secondary | ICD-10-CM

## 2022-01-30 ENCOUNTER — Other Ambulatory Visit: Payer: Medicare Other

## 2022-02-02 LAB — CBC
Hematocrit: 37.8 % (ref 37.5–51.0)
Hemoglobin: 12.9 g/dL — ABNORMAL LOW (ref 13.0–17.7)
MCH: 31.3 pg (ref 26.6–33.0)
MCHC: 34.1 g/dL (ref 31.5–35.7)
MCV: 92 fL (ref 79–97)
Platelets: 258 10*3/uL (ref 150–450)
RBC: 4.12 x10E6/uL — ABNORMAL LOW (ref 4.14–5.80)
RDW: 11.8 % (ref 11.6–15.4)
WBC: 8.5 10*3/uL (ref 3.4–10.8)

## 2022-02-02 LAB — LIPID PANEL WITH LDL/HDL RATIO
Cholesterol, Total: 118 mg/dL (ref 100–199)
HDL: 47 mg/dL (ref >39)
LDL Chol Calc (NIH): 37 mg/dL (ref 0–99)
LDL/HDL Ratio: 0.8 ratio (ref 0.0–3.6)
Triglycerides: 217 mg/dL — ABNORMAL HIGH (ref 0–149)
VLDL Cholesterol Cal: 34 mg/dL (ref 5–40)

## 2022-02-02 LAB — CMP14+EGFR
ALT: 19 IU/L (ref 0–44)
AST: 16 IU/L (ref 0–40)
Albumin/Globulin Ratio: 1.9 (ref 1.2–2.2)
Albumin: 4.4 g/dL (ref 3.7–4.7)
Alkaline Phosphatase: 91 IU/L (ref 44–121)
BUN/Creatinine Ratio: 14 (ref 10–24)
BUN: 21 mg/dL (ref 8–27)
Bilirubin Total: 0.4 mg/dL (ref 0.0–1.2)
CO2: 23 mmol/L (ref 20–29)
Calcium: 9.7 mg/dL (ref 8.6–10.2)
Chloride: 101 mmol/L (ref 96–106)
Creatinine, Ser: 1.51 mg/dL — ABNORMAL HIGH (ref 0.76–1.27)
Globulin, Total: 2.3 g/dL (ref 1.5–4.5)
Glucose: 123 mg/dL — ABNORMAL HIGH (ref 70–99)
Potassium: 4.2 mmol/L (ref 3.5–5.2)
Sodium: 139 mmol/L (ref 134–144)
Total Protein: 6.7 g/dL (ref 6.0–8.5)
eGFR: 48 mL/min/{1.73_m2} — ABNORMAL LOW (ref >59)

## 2022-02-02 LAB — TSH+T4F+T3FREE
Free T4: 1 ng/dL (ref 0.82–1.77)
T3, Free: 2.3 pg/mL (ref 2.0–4.4)
TSH: 5.77 u[IU]/mL — ABNORMAL HIGH (ref 0.450–4.500)

## 2022-02-04 ENCOUNTER — Other Ambulatory Visit: Payer: Medicare Other

## 2022-02-04 ENCOUNTER — Ambulatory Visit: Payer: Medicare Other | Admitting: Cardiology

## 2022-02-05 ENCOUNTER — Other Ambulatory Visit: Payer: Self-pay

## 2022-02-05 ENCOUNTER — Ambulatory Visit: Payer: Medicare Other

## 2022-02-05 DIAGNOSIS — I6523 Occlusion and stenosis of bilateral carotid arteries: Secondary | ICD-10-CM

## 2022-02-07 ENCOUNTER — Other Ambulatory Visit: Payer: Self-pay | Admitting: Cardiology

## 2022-02-07 DIAGNOSIS — I10 Essential (primary) hypertension: Secondary | ICD-10-CM

## 2022-03-05 ENCOUNTER — Encounter: Payer: Self-pay | Admitting: Cardiology

## 2022-03-05 ENCOUNTER — Other Ambulatory Visit: Payer: Medicare Other

## 2022-03-05 ENCOUNTER — Other Ambulatory Visit: Payer: Self-pay

## 2022-03-05 ENCOUNTER — Ambulatory Visit: Payer: Medicare Other | Admitting: Cardiology

## 2022-03-05 VITALS — BP 126/64 | HR 65 | Temp 97.9°F | Resp 16 | Ht 71.0 in | Wt 204.0 lb

## 2022-03-05 DIAGNOSIS — E1122 Type 2 diabetes mellitus with diabetic chronic kidney disease: Secondary | ICD-10-CM

## 2022-03-05 DIAGNOSIS — I2510 Atherosclerotic heart disease of native coronary artery without angina pectoris: Secondary | ICD-10-CM

## 2022-03-05 DIAGNOSIS — N1831 Chronic kidney disease, stage 3a: Secondary | ICD-10-CM

## 2022-03-05 DIAGNOSIS — I10 Essential (primary) hypertension: Secondary | ICD-10-CM

## 2022-03-05 DIAGNOSIS — I6523 Occlusion and stenosis of bilateral carotid arteries: Secondary | ICD-10-CM

## 2022-03-05 DIAGNOSIS — E782 Mixed hyperlipidemia: Secondary | ICD-10-CM

## 2022-03-05 NOTE — Progress Notes (Signed)
Primary Physician:  Tamsen Roers, MD   Patient ID: Miguel Brock, male    DOB: 1946-08-03, 76 y.o.   MRN: [id number]  Subjective:    Chief Complaint  Patient presents with   Coronary Artery Disease   Hyperlipidemia   Carotid Artery Stenosis   Follow-up    6 months    HPI: Miguel Brock  is a 76 y.o. male  with history of known coronary artery disease with stenting to mid LAD and circumflex coronary artery on 05/28/2011 and to mid RCA on 06/10/2011 with DES and nondrug eluding stent to the right coronary artery and balloon PTCA to PDA on 05/24/2015, last cardiac catheterization on 09/20/2019 revealing widely patent vessels with very mild neointimal hyperplasia in the LAD.  He also has asymptomatic bilateral carotid stenosis, controlled diabetes, mixed hyperlipidemia and hypertension.  He is presently doing well and essentially remains asymptomatic except for occasional dizziness and has also noticed occasional episodes of pedal edema.  No chest pain.  Dyspnea has remained stable.  He continues to be very active.  Past Medical History:  Diagnosis Date   Alpha galactosidase deficiency    Arthritis    Bursitis of left hip    Carotid stenosis    Stenosis in the right internal carotid artery (50-69%). Stenosis in the right external carotid artery (<50%).  Stenosis in the left external carotid artery (<50%).  Antegrade right vertebral artery flow. Antegrade left vertebral artery flow.   Chest pain    COPD (chronic obstructive pulmonary disease) (HCC)    Coronary artery disease 11,15   stents x7   Diabetes mellitus without complication (HCC)    TYPE 2   Dyspnea    Essential (primary) hypertension 03/30/2019   GERD (gastroesophageal reflux disease)    zantac   H/O hiatal hernia    Hepatitis 70's   food treatment for it in the 80's   History of migraine    Hypertension    Trochanteric bursitis of left hip 05/24/2014   Social History   Tobacco Use   Smoking status: Former     Packs/day: 1.50    Years: 23.00    Pack years: 34.50    Types: Cigarettes    Quit date: 07/05/1981    Years since quitting: 40.6   Smokeless tobacco: Never   Tobacco comments:    cuban cigars once/[place]  Substance Use Topics   Alcohol use: Yes    Alcohol/week: 14.0 standard drinks    Types: 14 Standard drinks or equivalent per week    Comment: pint per week   Marital Status: Married    Review of Systems  Cardiovascular:  Negative for chest pain, dyspnea on exertion and leg swelling.  Objective:   Vitals with BMI 03/05/2022 08/02/2021 02/15/2021  Height $Remov'5\' 11"'GdMtHX$  $RemoveB'5\' 11"'ORweTipo$  $RemoveBef'5\' 11"'KFxLxmdtau$   Weight 204 lbs 204 lbs 208 lbs 13 oz  BMI 28.46 70.48 88.91  Systolic 694 503 888  Diastolic 64 62 60  Pulse 65 60 64       Physical Exam Constitutional:      Appearance: He is well-developed.     Comments: Mildly obese  Neck:     Vascular: No JVD.  Cardiovascular:     Rate and Rhythm: Normal rate and regular rhythm.     Pulses: Intact distal pulses.          Carotid pulses are  on the right side with bruit and  on the left side with bruit.  Femoral pulses are 2+ on the right side and 2+ on the left side.      Popliteal pulses are 2+ on the right side and 2+ on the left side.       Dorsalis pedis pulses are 0 on the right side and 0 on the left side.       Posterior tibial pulses are 2+ on the right side and 2+ on the left side.     Heart sounds: Murmur heard.  Early systolic murmur is present with a grade of 2/6 at the upper right sternal border.  Pulmonary:     Effort: Pulmonary effort is normal. No accessory muscle usage or respiratory distress.     Breath sounds: Normal breath sounds.  Abdominal:     General: Bowel sounds are normal.     Palpations: Abdomen is soft.   Radiology: No results found.  Laboratory examination:    CMP Latest Ref Rng & Units 02/01/2022 01/08/2021 12/11/2020  Glucose 70 - 99 mg/dL 123(H) 122(H) 128(H)  BUN 8 - 27 mg/dL $Remove'21 25 22  'TPHyTmU$ Creatinine 0.76 - 1.27 mg/dL  1.51(H) 1.65(H) 1.77(H)  Sodium 134 - 144 mmol/L 139 141 141  Potassium 3.5 - 5.2 mmol/L 4.2 4.3 4.1  Chloride 96 - 106 mmol/L 101 103 105  CO2 20 - 29 mmol/L $RemoveB'23 21 23  'UWmkPNZz$ Calcium 8.6 - 10.2 mg/dL 9.7 10.5(H) 9.3  Total Protein 6.0 - 8.5 g/dL 6.7 7.1 -  Total Bilirubin 0.0 - 1.2 mg/dL 0.4 0.3 -  Alkaline Phos 44 - 121 IU/L 91 115 -  AST 0 - 40 IU/L 16 13 -  ALT 0 - 44 IU/L 19 15 -  eGFR 48 mL  CBC Latest Ref Rng & Units 02/01/2022 01/08/2021 12/11/2020  WBC 3.4 - 10.8 x10E3/uL 8.5 8.3 8.8  Hemoglobin 13.0 - 17.7 g/dL 12.9(L) 12.6(L) 13.2  Hematocrit 37.5 - 51.0 % 37.8 36.9(L) 38.9(L)  Platelets 150 - 450 x10E3/uL 258 335 256   Lipid Panel     Component Value Date/Time   CHOL 118 02/01/2022 0729   TRIG 217 (H) 02/01/2022 0729   HDL 47 02/01/2022 0729   LDLCALC 37 02/01/2022 0729   HEMOGLOBIN A1C Lab Results  Component Value Date   HGBA1C 6.4 (H) 01/08/2021   MPG 131.24 12/11/2020   TSH Recent Labs    02/01/22 0726  TSH 5.770*    Current Outpatient Medications:    acetaminophen (TYLENOL) 500 MG tablet, Take 1,000 mg by mouth at bedtime., Disp: , Rfl:    aspirin EC 81 MG tablet, Take 81 mg by mouth daily., Disp: , Rfl:    atorvastatin (LIPITOR) 80 MG tablet, Take 1 tablet (80 mg total) by mouth daily., Disp: 90 tablet, Rfl: 3   cetirizine (ZYRTEC) 10 MG tablet, Take 10 mg by mouth daily., Disp: , Rfl:    chlorthalidone (HYGROTON) 25 MG tablet, Take 1 tablet (25 mg total) by mouth every morning., Disp: 90 tablet, Rfl: 3   cyclobenzaprine (FLEXERIL) 10 MG tablet, Take 1 tablet (10 mg total) by mouth 3 (three) times daily as needed for muscle spasms., Disp: 30 tablet, Rfl: 1   docusate sodium (COLACE) 100 MG capsule, Take 100 mg by mouth daily., Disp: , Rfl:    ezetimibe (ZETIA) 10 MG tablet, TAKE 1 TABLET DAILY, Disp: 90 tablet, Rfl: 3   losartan (COZAAR) 100 MG tablet, TAKE 1 TABLET DAILY, Disp: 90 tablet, Rfl: 3   metFORMIN (GLUCOPHAGE) 1000 MG tablet, Take 0.5-1 tablets  (  500-1,000 mg total) by mouth See admin instructions. Take 1000mg  in the AM and 500mg  in the PM., Disp: 90 tablet, Rfl: 3   metoprolol succinate (TOPROL-XL) 50 MG 24 hr tablet, TAKE 1 TABLET DAILY. TAKE WITH OR IMMEDIATELY FOLLOWING A MEAL. (Patient taking differently: 100 mg.), Disp: 90 tablet, Rfl: 3   nitroGLYCERIN (NITROSTAT) 0.4 MG SL tablet, Place 1 tablet (0.4 mg total) under the tongue every 5 (five) minutes as needed for chest pain., Disp: 25 tablet, Rfl: 1   omeprazole (PRILOSEC) 20 MG capsule, Take 20 mg by mouth as needed., Disp: , Rfl:    traMADol (ULTRAM) 50 MG tablet, Take 50 mg by mouth 2 (two) times daily as needed for pain., Disp: , Rfl:    amLODipine (NORVASC) 10 MG tablet, Take 0.5 tablets (5 mg total) by mouth daily., Disp: , Rfl:    oxyCODONE-acetaminophen (PERCOCET/ROXICET) 5-325 MG tablet, Take 1 tablet by mouth as needed. (Patient not taking: Reported on 03/05/2022), Disp: , Rfl:       Cardiac Studies:   Exercise myoview stress 03/03/2019: 1. Lexiscan stress test with low level exercise was performed. Resting blood pressure was 146/70 mmHg and peak effect blood pressure was 172/64 mmHg. The patient developed significant symptoms which included chest and abdominal pain. The chest pain was relieved with rest. The resting and stress electrocardiogram demonstrated normal sinus rhythm, normal resting conduction, occasional PVC's and nonspecific ST-T changes.  Stress EKG is non diagnostic for ischemia as it is a pharmacologic stress.   2. The overall quality of the study is excellent. There is no evidence of abnormal lung activity. Stress and rest SPECT images demonstrate homogeneous tracer distribution throughout the myocardium. Gated SPECT imaging reveals normal myocardial thickening and wall motion. The left ventricular ejection fraction was normal (52%).   3. Low risk study.  Left heart catheterization 09/20/2019: LM: Normal LAD: Mild diffuse disease and late lumen loss within  prior Proximal and Mid LAD stent 3x22 and 3x38 Xience Prime DES patent ( (05/27/13). No significant ISR. 100% occluded distal apical LAD. Right to left collaterals from distal RCA. LCx: Minimal late lumen loss within prior prox LCx stent. No significant ISR. RCA: Minimal late lumen loss within mid RCA stent. No significant ISR of mid RCA 3.0 x 22 mm resolute stent placed on 06/10/2011.  Scoring balloon angioplasty at the PDA stent widely patent. Normal LVEDP.   Continue medical management for CAD.  Overall no significant change from angiography: 05/24/2015. Recommend pulmonary follow up regarding possible COPD.  Carotid artery duplex 02/09/2022: Duplex suggests stenosis in the right internal carotid artery (50-69%). Duplex suggests stenosis in the right external carotid artery (<50%). Duplex suggests stenosis in the left internal carotid artery (1-15%). Antegrade right vertebral artery flow. Antegrade left vertebral artery flow. No significant change from 07/10/2021. Follow up in six months is appropriate if clinically indicated.   EKG:  EKG 03/05/2022: Normal sinus rhythm at rate of 67 bpm, left atrial enlargement, no evidence of ischemia.  No significant change from 08/02/2021.   Assessment:     ICD-10-CM   1. Coronary artery disease involving native coronary artery of native heart without angina pectoris  I25.10 EKG 12-Lead    2. Asymptomatic bilateral carotid artery stenosis  I65.23 PCV CAROTID DUPLEX (BILATERAL)    3. Mixed hyperlipidemia  E78.2     4. Primary hypertension  I10 amLODipine (NORVASC) 10 MG tablet    5. Type 2 diabetes mellitus with stage 3a chronic kidney disease, without long-term current  use of insulin (HCC)  E11.22    N18.31       No orders of the defined types were placed in this encounter.    Medications Discontinued During This Encounter  Medication Reason   losartan (COZAAR) 50 MG tablet    amLODipine (NORVASC) 10 MG tablet     Orders Placed This  Encounter  Procedures   EKG 12-Lead     Recommendations:   JAKHARI SPACE  is a 76 y.o. with history of known coronary artery disease with stenting to mid LAD and circumflex coronary artery on 05/28/2011 and to mid RCA on 06/10/2011 with DES and nondrug eluding stent to the right coronary artery and balloon PTCA to PDA on 05/24/2015, last cardiac catheterization on 09/20/2019 revealing widely patent vessels with very mild neointimal hyperplasia in the LAD.  He also has asymptomatic bilateral carotid stenosis, controlled diabetes, mixed hyperlipidemia and hypertension.  I reviewed the results of the carotid artery duplex, carotid stenosis is remained stable, he will need continued surveillance in 6 months.  With Lipitor 80 mg, his LDL is at goal.  Elevated triglycerides related to diabetes and also diet still contributing.  Blood pressure is well controlled in fact is softer side, he has had episodes of dizziness and also leg edema.  We will decrease the dose of amlodipine from 10 mg to 5 mg and see if his symptoms would improve.  Otherwise remained stable from cardiac standpoint, no change in his exam or the EKG, I will see him back on an annual basis.     Adrian Prows, MD, [facility] 03/05/2022, 5:33 PM Office: [phone] Pager: [phone]

## 2022-03-25 ENCOUNTER — Other Ambulatory Visit: Payer: Self-pay | Admitting: Cardiology

## 2022-06-24 ENCOUNTER — Other Ambulatory Visit: Payer: Self-pay | Admitting: Cardiology

## 2022-07-09 ENCOUNTER — Other Ambulatory Visit: Payer: Self-pay

## 2022-07-09 ENCOUNTER — Telehealth: Payer: Self-pay

## 2022-07-09 NOTE — Telephone Encounter (Signed)
Refill request completed.

## 2022-08-12 NOTE — Progress Notes (Signed)
Subjective:    Miguel Brock - 76 y.o. male MRN [id number]  Date of birth: 1946/03/20  HPI  Miguel Brock is to establish care.   Current issues and/or concerns: - Taking Metformin for diabetes without issues or concerns.  - Established with Cardiology. Ankle edema has been addressed by the same. - Established with Orthopedics. Tramadol managed by the same and to request medication refills from them. Patient verbalized understanding.  - Established with Dermatology.  - Reports a tree fell on him 3 days ago while working out in the fields. Did not lose consciousness. He did not report to the emergency department at that time. When asked why patient states "I do not go to hospitals." Having chest discomfort of left side especially with extension. Has several scratches to to left side of body. Using Neosporin for left hand and covered in bandage, declines me to assess today.  - Left lower extremity with redness began 3 days ago. States his wife is a retired [employer] scientist and she  thinks its cellulitis.   ROS per HPI    Health Maintenance:  Health Maintenance Due  Topic Date Due   FOOT EXAM  Never done   OPHTHALMOLOGY EXAM  Never done   Hepatitis C Screening  Never done   Zoster Vaccines- Shingrix (1 of 2) Never done   COVID-19 Vaccine (3 - Moderna series) 03/29/2020   INFLUENZA VACCINE  07/30/2022     Past Medical History: Patient Active Problem List   Diagnosis Date Noted   Alcohol abuse 08/21/2022   Benign prostatic hyperplasia 08/21/2022   Dupuytren's contracture 08/21/2022   Bilateral tinnitus 08/21/2022   Chronic post-traumatic stress disorder 08/21/2022   Cracked tooth 08/21/2022   Dermatochalasis of left upper eyelid 08/21/2022   Dermatochalasis of right upper eyelid 08/21/2022   Moderate major depression ([place]) 08/21/2022   Obesity 08/21/2022   Personal history of colonic polyps 08/21/2022   Inguinal pain 10/30/2021   Low back pain 10/30/2021   Diabetic retinopathy  ([place]) 07/12/2021   Sacroiliac joint pain 05/29/2021   Asymptomatic bilateral carotid artery stenosis 02/15/2021   Diabetes mellitus ([place]) 02/15/2021   S/P right rotator cuff repair 07/28/2020   Trochanteric bursitis of right hip 07/21/2020   Pain in joint of right hip 07/20/2020   Hyperlipidemia 04/24/2020   Somatic dysfunction of right sacroiliac joint 04/24/2020   Gastroesophageal reflux disease 11/30/2019   H/O total shoulder replacement, left 09/17/2019   Essential (primary) hypertension 03/30/2019   Benign essential hypertension 03/30/2019   Shortness of breath 06/29/2018   Dyspnea 06/29/2018   Post PTCA 05/23/2015   Atherosclerotic heart disease of native coronary artery without angina pectoris    GERD (gastroesophageal reflux disease)    Postsurgical percutaneous transluminal coronary angioplasty (PTCA) status 01/11/2014      Social History   reports that he quit smoking about 41 years ago. His smoking use included cigarettes. He has a 34.50 pack-year smoking history. He has been exposed to tobacco smoke. He has never used smokeless tobacco. He reports current alcohol use of about 14.0 standard drinks of alcohol per week. He reports that he does not use drugs.   Family History  family history includes Healthy in his mother; Heart attack in his brother and father; Heart disease in his father.   Medications: reviewed and updated   Objective:   Physical Exam BP (!) 144/55 (BP Location: Left Arm, Patient Position: Sitting, Cuff Size: Normal)   Pulse 62   Temp 98.3 F (36.8  C)   Resp 16   Ht 5' 10.98" (1.803 m)   Wt 204 lb (92.5 kg)   SpO2 95%   BMI 28.47 kg/m   Physical Exam HENT:     Head: Normocephalic and atraumatic.  Eyes:     Extraocular Movements: Extraocular movements intact.     Conjunctiva/sclera: Conjunctivae normal.     Pupils: Pupils are equal, round, and reactive to light.  Cardiovascular:     Rate and Rhythm: Normal rate and regular rhythm.      Pulses: Normal pulses.     Heart sounds: Normal heart sounds.  Pulmonary:     Effort: Pulmonary effort is normal.     Breath sounds: Normal breath sounds.  Musculoskeletal:     Cervical back: Normal range of motion and neck supple.  Skin:    General: Skin is warm.     Findings: Bruising present.     Comments: Left abdomen with bruising and scabbed scratch, no evidence of drainage. Several scabbed scratches left side, no evidence of drainage. Left lower extremity near ankle and shin with warm erythematous rash, no evidence of drainage.   Neurological:     General: No focal deficit present.     Mental Status: He is alert and oriented to person, place, and time.  Psychiatric:        Mood and Affect: Mood normal.        Behavior: Behavior normal.     Results for orders placed or performed in visit on 08/21/22  POCT glycosylated hemoglobin (Hb A1C)  Result Value Ref Range   Hemoglobin A1C 7.1 (A) 4.0 - 5.6 %   HbA1c POC (<> result, manual entry)     HbA1c, POC (prediabetic range)     HbA1c, POC (controlled diabetic range)         Assessment & Plan:  1. Encounter to establish care - Patient presents today to establish care.  - Return for annual physical examination, labs, and health maintenance. Arrive fasting meaning having no food for at least 8 hours prior to appointment. You may have only water or black coffee. Please take scheduled medications as normal.  2. Type 2 diabetes mellitus with other specified complication, without long-term current use of insulin (HCC) - Hemoglobin A1c at goal 7.1%, goal < 8%. - Continue Metformin as prescribed.  - Discussed the importance of healthy eating habits, low-carbohydrate diet, low-sugar diet, regular aerobic exercise (at least 150 minutes a week as tolerated) and medication compliance to achieve or maintain control of diabetes. - Follow-up with primary provider in 4 months or sooner if needed.  - POCT glycosylated hemoglobin (Hb A1C) -  metFORMIN (GLUCOPHAGE) 1000 MG tablet; TAKE 1 TABLET IN THE MORNING AND TAKE ONE-HALF (1/2) TABLET IN THE EVENING  Dispense: 45 tablet; Refill: 3  3. Chest discomfort - Diagnostic chest xray for further evaluation.  - DG Chest 2 View; Future  4. Cellulitis of left lower extremity - Sulfamethoxazole-Trimethoprim as prescribed. Counseled on medication adherence and adverse effects. - Follow-up in 1 to 2 weeks or sooner if needed.  - Patient encouraged to schedule appointment with his established dermatologist. Patient agreeable. - sulfamethoxazole-trimethoprim (BACTRIM DS) 800-160 MG tablet; Take 1 tablet by mouth 2 (two) times daily for 7 days.  Dispense: 14 tablet; Refill: 0  5. Primary hypertension 6. Coronary artery disease involving native coronary artery of native heart without angina pectoris 7. Mixed hyperlipidemia - Keep all scheduled appointments at Cardiology.   8. Stage 3a chronic kidney  disease [facility]) - Referral to Nephrology for further evaluation and management. - Ambulatory referral to Nephrology  9. Chronic back pain, unspecified back location, unspecified back pain laterality 10. Chronic hip pain, unspecified laterality 11. Chronic elbow pain, unspecified laterality - Keep all scheduled appointments with Orthopedics.     Patient was given clear instructions to go to Emergency Department or return to medical center if symptoms don't improve, worsen, or new problems develop.The patient verbalized understanding.  I discussed the assessment and treatment plan with the patient. The patient was provided an opportunity to ask questions and all were answered. The patient agreed with the plan and demonstrated an understanding of the instructions.   The patient was advised to call back or seek an in-person evaluation if the symptoms worsen or if the condition fails to improve as anticipated.    Durene Fruits, NP 08/21/2022, 9:47 AM Primary Care at [facility]

## 2022-08-20 ENCOUNTER — Other Ambulatory Visit: Payer: Self-pay | Admitting: Cardiology

## 2022-08-21 ENCOUNTER — Encounter: Payer: Self-pay | Admitting: Family

## 2022-08-21 ENCOUNTER — Ambulatory Visit (INDEPENDENT_AMBULATORY_CARE_PROVIDER_SITE_OTHER): Payer: Medicare Other

## 2022-08-21 ENCOUNTER — Ambulatory Visit (INDEPENDENT_AMBULATORY_CARE_PROVIDER_SITE_OTHER): Payer: Medicare Other | Admitting: Family

## 2022-08-21 VITALS — BP 144/55 | HR 62 | Temp 98.3°F | Resp 16 | Ht 70.98 in | Wt 204.0 lb

## 2022-08-21 DIAGNOSIS — Z860100 Personal history of colon polyps, unspecified: Secondary | ICD-10-CM | POA: Insufficient documentation

## 2022-08-21 DIAGNOSIS — H02834 Dermatochalasis of left upper eyelid: Secondary | ICD-10-CM | POA: Insufficient documentation

## 2022-08-21 DIAGNOSIS — N1831 Chronic kidney disease, stage 3a: Secondary | ICD-10-CM

## 2022-08-21 DIAGNOSIS — R0789 Other chest pain: Secondary | ICD-10-CM

## 2022-08-21 DIAGNOSIS — M25529 Pain in unspecified elbow: Secondary | ICD-10-CM

## 2022-08-21 DIAGNOSIS — L03116 Cellulitis of left lower limb: Secondary | ICD-10-CM

## 2022-08-21 DIAGNOSIS — F1010 Alcohol abuse, uncomplicated: Secondary | ICD-10-CM | POA: Insufficient documentation

## 2022-08-21 DIAGNOSIS — M25559 Pain in unspecified hip: Secondary | ICD-10-CM

## 2022-08-21 DIAGNOSIS — I2510 Atherosclerotic heart disease of native coronary artery without angina pectoris: Secondary | ICD-10-CM

## 2022-08-21 DIAGNOSIS — G8929 Other chronic pain: Secondary | ICD-10-CM

## 2022-08-21 DIAGNOSIS — F321 Major depressive disorder, single episode, moderate: Secondary | ICD-10-CM

## 2022-08-21 DIAGNOSIS — H02831 Dermatochalasis of right upper eyelid: Secondary | ICD-10-CM | POA: Insufficient documentation

## 2022-08-21 DIAGNOSIS — I10 Essential (primary) hypertension: Secondary | ICD-10-CM

## 2022-08-21 DIAGNOSIS — E1169 Type 2 diabetes mellitus with other specified complication: Secondary | ICD-10-CM

## 2022-08-21 DIAGNOSIS — Z7689 Persons encountering health services in other specified circumstances: Secondary | ICD-10-CM

## 2022-08-21 DIAGNOSIS — M549 Dorsalgia, unspecified: Secondary | ICD-10-CM

## 2022-08-21 DIAGNOSIS — H9313 Tinnitus, bilateral: Secondary | ICD-10-CM | POA: Insufficient documentation

## 2022-08-21 DIAGNOSIS — E782 Mixed hyperlipidemia: Secondary | ICD-10-CM

## 2022-08-21 DIAGNOSIS — F4312 Post-traumatic stress disorder, chronic: Secondary | ICD-10-CM

## 2022-08-21 DIAGNOSIS — E669 Obesity, unspecified: Secondary | ICD-10-CM | POA: Insufficient documentation

## 2022-08-21 DIAGNOSIS — K0381 Cracked tooth: Secondary | ICD-10-CM | POA: Insufficient documentation

## 2022-08-21 DIAGNOSIS — M720 Palmar fascial fibromatosis [Dupuytren]: Secondary | ICD-10-CM | POA: Insufficient documentation

## 2022-08-21 DIAGNOSIS — N400 Enlarged prostate without lower urinary tract symptoms: Secondary | ICD-10-CM | POA: Insufficient documentation

## 2022-08-21 HISTORY — DX: Post-traumatic stress disorder, chronic: F43.12

## 2022-08-21 HISTORY — DX: Major depressive disorder, single episode, moderate: F32.1

## 2022-08-21 LAB — POCT GLYCOSYLATED HEMOGLOBIN (HGB A1C): Hemoglobin A1C: 7.1 % — AB (ref 4.0–5.6)

## 2022-08-21 MED ORDER — METFORMIN HCL 1000 MG PO TABS: ORAL_TABLET | ORAL | 3 refills | Status: DC

## 2022-08-21 MED ORDER — SULFAMETHOXAZOLE-TRIMETHOPRIM 800-160 MG PO TABS: 1.0000 | ORAL_TABLET | Freq: Two times a day (BID) | ORAL | 0 refills | Status: AC

## 2022-08-21 NOTE — Patient Instructions (Signed)
Thank you for choosing Primary Care at [facility] for your medical home!    Miguel Brock was seen by Camillia Herter, NP today.   Miguel Brock's primary care provider is Camillia Herter, NP.   For the best care possible,  you should try to see Durene Fruits, NP whenever you come to office.   We look forward to seeing you again soon!  If you have any questions about your visit today,  please call us at [phone]  Or feel free to reach your provider via [place].    Keeping you healthy   Get these tests Blood pressure- Have your blood pressure checked once a year by your healthcare provider.  Normal blood pressure is 120/80. Weight- Have your body mass index (BMI) calculated to screen for obesity.  BMI is a measure of body fat based on height and weight. You can also calculate your own BMI at [employer]. Cholesterol- Have your cholesterol checked regularly starting at age 57, sooner may be necessary if you have diabetes, high blood pressure, if a family member developed heart diseases at an early age or if you smoke.  Chlamydia, HIV, and other sexual transmitted disease- Get screened each year until the age of 103 then within three months of each new sexual partner. Diabetes- Have your blood sugar checked regularly if you have high blood pressure, high cholesterol, a family history of diabetes or if you are overweight.   Get these vaccines Flu shot- Every fall. Tetanus shot- Every 10 years. Menactra- Single dose; prevents meningitis.   Take these steps Don't smoke- If you do smoke, ask your healthcare provider about quitting. For tips on how to quit, go to www.smokefree.gov or call 1-800-QUIT-NOW. Be physically active- Exercise 5 days a week for at least 30 minutes.  If you are not already physically active start slow and gradually work up to 30 minutes of moderate physical activity.  Examples of moderate activity include walking briskly, mowing the yard, dancing,  swimming bicycling, etc. Eat a healthy diet- Eat a variety of healthy foods such as fruits, vegetables, low fat milk, low fat cheese, yogurt, lean meats, poultry, fish, beans, tofu, etc.  For more information on healthy eating, go to www.thenutritionsource.org Drink alcohol in moderation- Limit alcohol intake two drinks or less a day.  Never drink and drive. Dentist- Brush and floss teeth twice daily; visit your dentis twice a year. Depression-Your emotional health is as important as your physical health.  If you're feeling down, losing interest in things you normally enjoy please talk with your healthcare provider. Gun Safety- If you keep a gun in your home, keep it unloaded and with the safety lock on.  Bullets should be stored separately. Helmet use- Always wear a helmet when riding a motorcycle, bicycle, rollerblading or skateboarding. Safe sex- If you may be exposed to a sexually transmitted infection, use a condom Seat belts- Seat bels can save your life; always wear one. Smoke/Carbon Monoxide detectors- These detectors need to be installed on the appropriate level of your home.  Replace batteries at least once a year. Skin Cancer- When out in the sun, cover up and use sunscreen SPF 15 or higher. Violence- If anyone is threatening or hurting you, please tell your healthcare provider.

## 2022-08-21 NOTE — Progress Notes (Signed)
.  Pt presents to establish care,  -tree fell on pt last Sunday and experiencing pain in left rib cage  -skin peeled back on left hand pt using Neosporin  -desires to have A1c checked today  -A1c 6.4% 03/2021 up to 7.1 -mild edema in left ankle pt states due to spouse being retired Marine scientist she thinks its cellulitis  -needs refill on Tramadol
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
## 2022-09-04 ENCOUNTER — Ambulatory Visit: Payer: Medicare Other | Admitting: Family

## 2022-09-09 ENCOUNTER — Other Ambulatory Visit: Payer: Medicare Other

## 2022-09-10 ENCOUNTER — Ambulatory Visit: Payer: Medicare Other

## 2022-09-10 ENCOUNTER — Other Ambulatory Visit: Payer: Self-pay | Admitting: Cardiology

## 2022-09-10 DIAGNOSIS — I10 Essential (primary) hypertension: Secondary | ICD-10-CM

## 2022-09-10 DIAGNOSIS — I6523 Occlusion and stenosis of bilateral carotid arteries: Secondary | ICD-10-CM

## 2022-10-31 DIAGNOSIS — M79645 Pain in left finger(s): Secondary | ICD-10-CM | POA: Insufficient documentation

## 2022-12-02 DIAGNOSIS — N528 Other male erectile dysfunction: Secondary | ICD-10-CM | POA: Insufficient documentation

## 2022-12-27 ENCOUNTER — Telehealth: Payer: Self-pay | Admitting: Family

## 2022-12-27 NOTE — Telephone Encounter (Signed)
Left message for patient to call back and schedule Medicare Annual Wellness Visit (AWV) either virtually or phone  Left  my Miguel Brock number [phone]   awvi 08/31/19 per [facility]  please schedule with Nurse Health Adviser   45 min for awv-i and in office appointments 30 min for awv-s  phone/virtual appointments

## 2022-12-31 NOTE — Telephone Encounter (Signed)
Noted  

## 2022-12-31 NOTE — Telephone Encounter (Signed)
Returned patients call  left my # [phone]  Patient returned my call 12/29

## 2022-12-31 NOTE — Telephone Encounter (Signed)
Spoke with patient to schedule his AWV.    He stated he changed PCP.  He stated he goes to [facility] now

## 2023-01-15 DIAGNOSIS — K649 Unspecified hemorrhoids: Secondary | ICD-10-CM | POA: Insufficient documentation

## 2023-01-15 DIAGNOSIS — E790 Hyperuricemia without signs of inflammatory arthritis and tophaceous disease: Secondary | ICD-10-CM | POA: Insufficient documentation

## 2023-01-20 ENCOUNTER — Other Ambulatory Visit: Payer: Self-pay | Admitting: Cardiology

## 2023-01-20 DIAGNOSIS — E782 Mixed hyperlipidemia: Secondary | ICD-10-CM

## 2023-01-31 DIAGNOSIS — M65332 Trigger finger, left middle finger: Secondary | ICD-10-CM | POA: Insufficient documentation

## 2023-02-27 ENCOUNTER — Ambulatory Visit: Payer: Medicare Other

## 2023-02-27 DIAGNOSIS — I6523 Occlusion and stenosis of bilateral carotid arteries: Secondary | ICD-10-CM

## 2023-03-06 ENCOUNTER — Ambulatory Visit: Payer: Medicare Other | Admitting: Cardiology

## 2023-03-06 ENCOUNTER — Encounter: Payer: Self-pay | Admitting: Cardiology

## 2023-03-06 VITALS — BP 130/59 | HR 53 | Ht 70.0 in | Wt 215.2 lb

## 2023-03-06 DIAGNOSIS — I10 Essential (primary) hypertension: Secondary | ICD-10-CM

## 2023-03-06 DIAGNOSIS — I2510 Atherosclerotic heart disease of native coronary artery without angina pectoris: Secondary | ICD-10-CM

## 2023-03-06 DIAGNOSIS — E782 Mixed hyperlipidemia: Secondary | ICD-10-CM

## 2023-03-06 DIAGNOSIS — I6523 Occlusion and stenosis of bilateral carotid arteries: Secondary | ICD-10-CM

## 2023-03-06 DIAGNOSIS — R0609 Other forms of dyspnea: Secondary | ICD-10-CM

## 2023-03-06 NOTE — Progress Notes (Signed)
Primary Physician:  Camillia Herter, NP   Patient ID: Miguel Brock, male    DOB: [birth year], 77 y.o.   MRN: [id number]  Subjective:    Chief Complaint  Patient presents with   Coronary artery disease involving native coronary artery of    HPI: Miguel Brock  is a 77 y.o. male  with history of known coronary artery disease with stenting to mid LAD and circumflex coronary artery on 05/28/2011 and to mid RCA on 06/10/2011 with DES and nondrug eluding stent to the right coronary artery and balloon PTCA to PDA on 05/24/2015, last cardiac catheterization on 09/20/2019 revealing widely patent vessels with very mild neointimal hyperplasia in the LAD.  He also has asymptomatic bilateral carotid stenosis, controlled diabetes with stage IIIa chronic kidney disease, mixed hyperlipidemia and hypertension.  He is presently doing well and essentially remains asymptomatic except for occasional dizziness and has also noticed occasional episodes of pedal edema.  No chest pain.  Dyspnea has remained stable but occasionally states that he feels it is slightly worse compared to before.  He continues to be very active.  Past Medical History:  Diagnosis Date   Alpha galactosidase deficiency    Arthritis    Bursitis of left hip    Carotid stenosis    Stenosis in the right internal carotid artery (50-69%). Stenosis in the right external carotid artery (<50%).  Stenosis in the left external carotid artery (<50%).  Antegrade right vertebral artery flow. Antegrade left vertebral artery flow.   Chest pain    Chronic post-traumatic stress disorder 08/21/2022   COPD (chronic obstructive pulmonary disease) (HCC)    Coronary artery disease 11,15   stents x7   Diabetes mellitus without complication (HCC)    TYPE 2   Dyspnea    Essential (primary) hypertension 03/30/2019   GERD (gastroesophageal reflux disease)    zantac   H/O hiatal hernia    Hepatitis 70's   food treatment for it in the 80's   History of  migraine    Hypertension    Moderate major depression ([place]) 08/21/2022   Trochanteric bursitis of left hip 05/24/2014   Social History   Tobacco Use   Smoking status: Former    Packs/day: 1.50    Years: 23.00    Total pack years: 34.50    Types: Cigarettes    Quit date: 07/05/1981    Years since quitting: 41.6    Passive exposure: Past   Smokeless tobacco: Never   Tobacco comments:    cuban cigars once/[place]  Substance Use Topics   Alcohol use: Yes    Alcohol/week: 14.0 standard drinks of alcohol    Types: 14 Standard drinks or equivalent per week    Comment: pint per week   Marital Status: Married    Review of Systems  Cardiovascular:  Positive for dyspnea on exertion. Negative for chest pain and leg swelling.   Objective:      03/06/2023    2:09 PM 08/21/2022    8:24 AM 08/21/2022    8:03 AM  Vitals with BMI  Height '5\' 10"'$   5' 10.984"  Weight 215 lbs 3 oz  204 lbs  BMI [id number]  0000000  Systolic [id number] 0000000  Diastolic 59 55 75  Pulse 53  62       Physical Exam Constitutional:      Appearance: He is well-developed.     Comments: Mildly obese  Neck:     Vascular: No JVD.  Cardiovascular:     Rate and Rhythm: Normal rate and regular rhythm.     Pulses: Intact distal pulses.          Carotid pulses are  on the right side with bruit and  on the left side with bruit.      Femoral pulses are 2+ on the right side and 2+ on the left side.      Popliteal pulses are 2+ on the right side and 2+ on the left side.       Dorsalis pedis pulses are 0 on the right side and 0 on the left side.       Posterior tibial pulses are 2+ on the right side and 2+ on the left side.     Heart sounds: Murmur heard.     Early systolic murmur is present with a grade of 2/6 at the upper right sternal border.  Pulmonary:     Effort: Pulmonary effort is normal. No accessory muscle usage or respiratory distress.     Breath sounds: Normal breath sounds.  Abdominal:     General: Bowel sounds are  normal.     Palpations: Abdomen is soft.    Radiology: No results found.  Laboratory examination:   Lab Results  Component Value Date   NA 139 02/01/2022   K 4.2 02/01/2022   CO2 23 02/01/2022   GLUCOSE 123 (H) 02/01/2022   BUN 21 02/01/2022   CREATININE 1.51 (H) 02/01/2022   CALCIUM 9.7 02/01/2022   EGFR 48 (L) 02/01/2022   GFRNONAA 40 (L) 01/08/2021       Latest Ref Rng & Units 02/01/2022    7:26 AM 01/08/2021    9:26 AM 12/11/2020    1:43 PM  CMP  Glucose 70 - 99 mg/dL 123  122  128   BUN 8 - 27 mg/dL '21  25  22   '$ Creatinine 0.76 - 1.27 mg/dL 1.51  1.65  1.77   Sodium 134 - 144 mmol/L 139  141  141   Potassium 3.5 - 5.2 mmol/L 4.2  4.3  4.1   Chloride 96 - 106 mmol/L 101  103  105   CO2 20 - 29 mmol/L '23  21  23   '$ Calcium 8.6 - 10.2 mg/dL 9.7  10.5  9.3   Total Protein 6.0 - 8.5 g/dL 6.7  7.1    Total Bilirubin 0.0 - 1.2 mg/dL 0.4  0.3    Alkaline Phos 44 - 121 IU/L 91  115    AST 0 - 40 IU/L 16  13    ALT 0 - 44 IU/L 19  15         Latest Ref Rng & Units 02/01/2022    7:25 AM 01/08/2021    9:26 AM 12/11/2020    1:43 PM  CBC  WBC 3.4 - 10.8 x10E3/uL 8.5  8.3  8.8   Hemoglobin 13.0 - 17.7 g/dL 12.9  12.6  13.2   Hematocrit 37.5 - 51.0 % 37.8  36.9  38.9   Platelets 150 - 450 x10E3/uL 258  335  256    Lipid Panel     Component Value Date/Time   CHOL 118 02/01/2022 0729   TRIG 217 (H) 02/01/2022 0729   HDL 47 02/01/2022 0729   LDLCALC 37 02/01/2022 0729   HEMOGLOBIN A1C Lab Results  Component Value Date   HGBA1C 7.1 (A) 08/21/2022   MPG 131.24 12/11/2020   Lab Results  Component Value Date  TSH 5.770 (H) 02/01/2022     External labs:   Labs 12/02/2022,  A1c 6.7%.  Urine albumin 42.  TSH normal at 0.09.  Total cholesterol 108, triglycerides 182, HDL 42, LDL 30.  Sodium 138, potassium 3.8, BUN 30, creatinine 1.59, EGFR 44 mL, LFTs normal.   Current Outpatient Medications:    acetaminophen (TYLENOL) 500 MG tablet, Take 1,000 mg by mouth at  bedtime., Disp: , Rfl:    amLODipine (NORVASC) 10 MG tablet, Take 0.5 tablets (5 mg total) by mouth daily., Disp: , Rfl:    aspirin EC 81 MG tablet, Take 81 mg by mouth daily., Disp: , Rfl:    atorvastatin (LIPITOR) 80 MG tablet, TAKE 1 TABLET DAILY, Disp: 90 tablet, Rfl: 3   cetirizine (ZYRTEC) 10 MG tablet, Take 10 mg by mouth daily., Disp: , Rfl:    chlorthalidone (HYGROTON) 25 MG tablet, TAKE 1 TABLET EVERY MORNING, Disp: 90 tablet, Rfl: 3   cyclobenzaprine (FLEXERIL) 10 MG tablet, Take 1 tablet (10 mg total) by mouth 3 (three) times daily as needed for muscle spasms., Disp: 30 tablet, Rfl: 1   docusate sodium (COLACE) 100 MG capsule, Take 100 mg by mouth daily., Disp: , Rfl:    ezetimibe (ZETIA) 10 MG tablet, TAKE 1 TABLET DAILY, Disp: 90 tablet, Rfl: 0   JANUMET XR 50-1000 MG TB24, Take 1 tablet by mouth daily., Disp: , Rfl:    losartan (COZAAR) 100 MG tablet, TAKE 1 TABLET DAILY, Disp: 90 tablet, Rfl: 3   metoprolol succinate (TOPROL-XL) 50 MG 24 hr tablet, TAKE 1 TABLET DAILY. TAKE WITH OR IMMEDIATELY FOLLOWING A MEAL. (Patient taking differently: Take 50 mg by mouth daily.), Disp: 90 tablet, Rfl: 3   nitroGLYCERIN (NITROSTAT) 0.4 MG SL tablet, Place 1 tablet (0.4 mg total) under the tongue every 5 (five) minutes as needed for chest pain., Disp: 25 tablet, Rfl: 1   omeprazole (PRILOSEC) 20 MG capsule, Take 20 mg by mouth as needed., Disp: , Rfl:    traMADol (ULTRAM) 50 MG tablet, Take 50 mg by mouth 2 (two) times daily as needed for pain., Disp: , Rfl:       Cardiac Studies:   Exercise myoview stress 03/03/2019: 1. Lexiscan stress test with low level exercise was performed. Resting blood pressure was 146/70 mmHg and peak effect blood pressure was 172/64 mmHg. The patient developed significant symptoms which included chest and abdominal pain. The chest pain was relieved with rest. The resting and stress electrocardiogram demonstrated normal sinus rhythm, normal resting conduction,  occasional PVC's and nonspecific ST-T changes.  Stress EKG is non diagnostic for ischemia as it is a pharmacologic stress.   2. The overall quality of the study is excellent. There is no evidence of abnormal lung activity. Stress and rest SPECT images demonstrate homogeneous tracer distribution throughout the myocardium. Gated SPECT imaging reveals normal myocardial thickening and wall motion. The left ventricular ejection fraction was normal (52%).   3. Low risk study.  Left heart catheterization 09/20/2019: LM: Normal LAD: Mild diffuse disease and late lumen loss within prior Proximal and Mid LAD stent 3x22 and 3x38 Xience Prime DES patent ( (05/27/13). No significant ISR. 100% occluded distal apical LAD. Right to left collaterals from distal RCA. LCx: Minimal late lumen loss within prior prox LCx stent. No significant ISR. RCA: Minimal late lumen loss within mid RCA stent. No significant ISR of mid RCA 3.0 x 22 mm resolute stent placed on 06/10/2011.  Scoring balloon angioplasty at the PDA stent  widely patent. Normal LVEDP.   Continue medical management for CAD.  Overall no significant change from angiography: 05/24/2015. Recommend pulmonary follow up regarding possible COPD.  Carotid artery duplex 02/27/2023: Duplex suggests stenosis in the right internal carotid artery (50-69%). < 50% stenosis in the right external carotid artery. Duplex suggests stenosis in the left internal carotid artery (minimal). < 50% stenosis in the left common carotid artery. Antegrade right vertebral artery flow. Antegrade left vertebral artery flow.  No significant change from 09/10/2022. Follow up in six months is appropriate if clinically indicated.   EKG:  EKG 03/06/2023: Normal sinus rhythm at the rate of 55 bpm, normal axis, no evidence of ischemia, normal EKG.  Single PAC.  Compared to 03/05/2022, no change, previous heart rate was 67 bpm.  Assessment:     ICD-10-CM   1. Coronary artery disease involving  native coronary artery of native heart without angina pectoris  I25.10 EKG 12-Lead    PCV ECHOCARDIOGRAM COMPLETE    2. Dyspnea on exertion  R06.09 PCV ECHOCARDIOGRAM COMPLETE    3. Asymptomatic bilateral carotid artery stenosis  I65.23 PCV CAROTID DUPLEX (BILATERAL)    4. Primary hypertension  I10     5. Mixed hyperlipidemia  E78.2       No orders of the defined types were placed in this encounter.    Medications Discontinued During This Encounter  Medication Reason   metFORMIN (GLUCOPHAGE) 1000 MG tablet Patient Preference    Orders Placed This Encounter  Procedures   EKG 12-Lead   PCV ECHOCARDIOGRAM COMPLETE    Standing Status:   Future    Standing Expiration Date:   03/05/2024     Recommendations:   Miguel Brock  is a 77 y.o. with history of known coronary artery disease with stenting to mid LAD and circumflex coronary artery on 05/28/2011 and to mid RCA on 06/10/2011 with DES and nondrug eluding stent to the right coronary artery and balloon PTCA to PDA on 05/24/2015, last cardiac catheterization on 09/20/2019 revealing widely patent vessels with very mild neointimal hyperplasia in the LAD.  He also has asymptomatic bilateral carotid stenosis, controlled diabetes with stage IIIa chronic kidney disease, mixed hyperlipidemia and hypertension.  1. Coronary artery disease involving native coronary artery of native heart without angina pectoris Patient with stable coronary disease, he has not had any exertional chest pain.  But has recently noticed mild dyspnea on exertion.  EKG reveals normal sinus rhythm.  Do not suspect progression of coronary disease.  2. Dyspnea on exertion I will obtain an echocardiogram to evaluate his LV systolic function, if dyspnea persist, advised him that he should contact me and we can do further investigation including repeating stress testing or even cardiac catheterization.  3. Asymptomatic bilateral carotid artery stenosis Carotid artery stenosis  is remained stable.  As carotid stenosis is stable, I will repeat carotid duplex in 1 year instead of 6 months.  Continue aspirin.  4. Primary hypertension Blood pressure is under good control.  No changes in the medications were done today.  5. Mixed hyperlipidemia Reviewed his lipids, LDL is very well-controlled.  Elevated triglycerides related to poor diet, again weight loss discussed.  Overall stable from cardiac standpoint unless echocardiogram reveals marked abnormality, I will see him back in a year.     Adrian Prows, MD, [facility] 03/06/2023, 2:53 PM Office: [phone] Pager: [phone]

## 2023-03-13 ENCOUNTER — Ambulatory Visit: Payer: Medicare Other

## 2023-03-13 DIAGNOSIS — R0609 Other forms of dyspnea: Secondary | ICD-10-CM

## 2023-03-13 DIAGNOSIS — I2510 Atherosclerotic heart disease of native coronary artery without angina pectoris: Secondary | ICD-10-CM

## 2023-04-18 ENCOUNTER — Other Ambulatory Visit: Payer: Self-pay | Admitting: Cardiology

## 2023-04-18 DIAGNOSIS — E782 Mixed hyperlipidemia: Secondary | ICD-10-CM

## 2023-05-05 DIAGNOSIS — M65942 Unspecified synovitis and tenosynovitis, left hand: Secondary | ICD-10-CM | POA: Insufficient documentation

## 2023-05-07 DIAGNOSIS — M25562 Pain in left knee: Secondary | ICD-10-CM | POA: Insufficient documentation

## 2023-05-22 ENCOUNTER — Ambulatory Visit: Payer: Medicare Other | Admitting: Cardiology

## 2023-05-22 ENCOUNTER — Encounter: Payer: Self-pay | Admitting: Cardiology

## 2023-05-22 VITALS — BP 123/71 | HR 56 | Ht 70.0 in | Wt 215.0 lb

## 2023-05-22 DIAGNOSIS — R0602 Shortness of breath: Secondary | ICD-10-CM

## 2023-05-22 DIAGNOSIS — I10 Essential (primary) hypertension: Secondary | ICD-10-CM

## 2023-05-22 DIAGNOSIS — I2510 Atherosclerotic heart disease of native coronary artery without angina pectoris: Secondary | ICD-10-CM

## 2023-05-22 NOTE — H&P (View-Only) (Signed)
 Primary Physician:  Stephens, Amy J, NP   Patient ID: Miguel Brock, male    DOB: 07/19/1946, 77 y.o.   MRN: [id number]  Subjective:    Chief Complaint  Patient presents with   Coronary artery disease involving native coronary artery of   Follow-up    Sob, Light headed    HPI: Miguel Brock  is a 77 y.o. male  with history of known coronary artery disease with stenting to mid LAD and circumflex coronary artery on 05/28/2011 and to mid RCA on 06/10/2011 with DES and nondrug eluding stent to the right coronary artery and balloon PTCA to PDA on 05/24/2015, last cardiac catheterization on 09/20/2019 revealing widely patent vessels with very mild neointimal hyperplasia in the LAD.  He also has asymptomatic bilateral carotid stenosis, controlled diabetes with stage IIIa chronic kidney disease, mixed hyperlipidemia and hypertension.  He was seen by me 3 months ago, he has noticed worsening dyspnea on exertion.  Now he states that dyspnea is severe enough that even doing minimal activity he has to rest fairly often.  He has occasionally has had some sharp chest pain.  He has not used any sublingual nitroglycerin.  He has been very concerned about progression of coronary artery disease.  He has been compliant with all his medications.  Past Medical History:  Diagnosis Date   Alpha galactosidase deficiency    Arthritis    Bursitis of left hip    Carotid stenosis    Stenosis in the right internal carotid artery (50-69%). Stenosis in the right external carotid artery (<50%).  Stenosis in the left external carotid artery (<50%).  Antegrade right vertebral artery flow. Antegrade left vertebral artery flow.   Chest pain    Chronic post-traumatic stress disorder 08/21/2022   COPD (chronic obstructive pulmonary disease) (HCC)    Coronary artery disease 11,15   stents x7   Diabetes mellitus without complication (HCC)    TYPE 2   Dyspnea    Essential (primary) hypertension 03/30/2019   GERD  (gastroesophageal reflux disease)    zantac   H/O hiatal hernia    Hepatitis 70's   food treatment for it in the 80's   History of migraine    Hypertension    Moderate major depression (HCC) 08/21/2022   Trochanteric bursitis of left hip 05/24/2014   Social History   Tobacco Use   Smoking status: Former    Packs/day: 1.50    Years: 23.00    Additional pack years: 0.00    Total pack years: 34.50    Types: Cigarettes    Quit date: 07/05/1981    Years since quitting: 41.9    Passive exposure: Past   Smokeless tobacco: Never   Tobacco comments:    cuban cigars once/[place]  Substance Use Topics   Alcohol use: Yes    Alcohol/week: 14.0 standard drinks of alcohol    Types: 14 Standard drinks or equivalent per week    Comment: pint per week   Marital Status: Married    Review of Systems  Cardiovascular:  Positive for dyspnea on exertion. Negative for chest pain and leg swelling.   Objective:      05/22/2023    9:26 AM 03/06/2023    2:09 PM 08/21/2022    8:24 AM  Vitals with BMI  Height 5' 10" 5' 10"   Weight 215 lbs 215 lbs 3 oz   BMI 30.85 30.88   Systolic 123 130 144  Diastolic 71 59 55    Pulse 56 53        Physical Exam Constitutional:      Appearance: He is well-developed.     Comments: Mildly obese  Neck:     Vascular: Carotid bruit (bilateral) present. No JVD.  Cardiovascular:     Rate and Rhythm: Normal rate and regular rhythm.     Pulses: Normal pulses and intact distal pulses.     Heart sounds: Murmur heard.     Early systolic murmur is present with a grade of 2/6 at the upper right sternal border.  Pulmonary:     Effort: Pulmonary effort is normal. No accessory muscle usage or respiratory distress.     Breath sounds: Normal breath sounds.  Abdominal:     General: Bowel sounds are normal.     Palpations: Abdomen is soft.    Radiology: No results found.  Laboratory examination:   Lab Results  Component Value Date   NA 139 02/01/2022   K 4.2  02/01/2022   CO2 23 02/01/2022   GLUCOSE 123 (H) 02/01/2022   BUN 21 02/01/2022   CREATININE 1.51 (H) 02/01/2022   CALCIUM 9.7 02/01/2022   EGFR 48 (L) 02/01/2022   GFRNONAA 40 (L) 01/08/2021       Latest Ref Rng & Units 02/01/2022    7:26 AM 01/08/2021    9:26 AM 12/11/2020    1:43 PM  CMP  Glucose 70 - 99 mg/dL 123  122  128   BUN 8 - 27 mg/dL 21  25  22   Creatinine 0.76 - 1.27 mg/dL 1.51  1.65  1.77   Sodium 134 - 144 mmol/L 139  141  141   Potassium 3.5 - 5.2 mmol/L 4.2  4.3  4.1   Chloride 96 - 106 mmol/L 101  103  105   CO2 20 - 29 mmol/L 23  21  23   Calcium 8.6 - 10.2 mg/dL 9.7  10.5  9.3   Total Protein 6.0 - 8.5 g/dL 6.7  7.1    Total Bilirubin 0.0 - 1.2 mg/dL 0.4  0.3    Alkaline Phos 44 - 121 IU/L 91  115    AST 0 - 40 IU/L 16  13    ALT 0 - 44 IU/L 19  15         Latest Ref Rng & Units 02/01/2022    7:25 AM 01/08/2021    9:26 AM 12/11/2020    1:43 PM  CBC  WBC 3.4 - 10.8 x10E3/uL 8.5  8.3  8.8   Hemoglobin 13.0 - 17.7 g/dL 12.9  12.6  13.2   Hematocrit 37.5 - 51.0 % 37.8  36.9  38.9   Platelets 150 - 450 x10E3/uL 258  335  256    Lipid Panel     Component Value Date/Time   CHOL 118 02/01/2022 0729   TRIG 217 (H) 02/01/2022 0729   HDL 47 02/01/2022 0729   LDLCALC 37 02/01/2022 0729   HEMOGLOBIN A1C Lab Results  Component Value Date   HGBA1C 7.1 (A) 08/21/2022   MPG 131.24 12/11/2020   Lab Results  Component Value Date   TSH 5.770 (H) 02/01/2022     External labs:   Labs 12/02/2022,  A1c 6.7%.  Urine albumin 42.  TSH normal at 0.09.  Total cholesterol 108, triglycerides 182, HDL 42, LDL 30.  Sodium 138, potassium 3.8, BUN 30, creatinine 1.59, EGFR 44 mL, LFTs normal.   Current Outpatient Medications:    acetaminophen (TYLENOL)   500 MG tablet, Take 1,000 mg by mouth at bedtime., Disp: , Rfl:    amLODipine-atorvastatin (CADUET) 10-40 MG tablet, Take 1 tablet by mouth daily., Disp: , Rfl:    aspirin EC 81 MG tablet, Take 81 mg by mouth  daily., Disp: , Rfl:    atenolol-chlorthalidone (TENORETIC) 50-25 MG tablet, Take 1 tablet by mouth daily., Disp: , Rfl:    cetirizine (ZYRTEC) 10 MG tablet, Take 10 mg by mouth daily., Disp: , Rfl:    chlorthalidone (HYGROTON) 25 MG tablet, TAKE 1 TABLET EVERY MORNING, Disp: 90 tablet, Rfl: 3   cyclobenzaprine (FLEXERIL) 10 MG tablet, Take 1 tablet (10 mg total) by mouth 3 (three) times daily as needed for muscle spasms., Disp: 30 tablet, Rfl: 1   docusate sodium (COLACE) 100 MG capsule, Take 100 mg by mouth daily., Disp: , Rfl:    ezetimibe (ZETIA) 10 MG tablet, TAKE 1 TABLET DAILY, Disp: 90 tablet, Rfl: 3   JANUMET XR 50-1000 MG TB24, Take 1 tablet by mouth daily., Disp: , Rfl:    losartan (COZAAR) 100 MG tablet, TAKE 1 TABLET DAILY (Patient taking differently: Take 50 mg by mouth daily.), Disp: 90 tablet, Rfl: 3   meloxicam (MOBIC) 15 MG tablet, Take 15 mg by mouth daily., Disp: , Rfl:    nitroGLYCERIN (NITROSTAT) 0.4 MG SL tablet, Place 1 tablet (0.4 mg total) under the tongue every 5 (five) minutes as needed for chest pain., Disp: 25 tablet, Rfl: 1   omeprazole (PRILOSEC) 20 MG capsule, Take 20 mg by mouth as needed., Disp: , Rfl:    traMADol (ULTRAM) 50 MG tablet, Take 50 mg by mouth 2 (two) times daily as needed for pain., Disp: , Rfl:       Cardiac Studies:   Exercise myoview stress 03/03/2019: 1. Lexiscan stress test with low level exercise was performed. Resting blood pressure was 146/70 mmHg and peak effect blood pressure was 172/64 mmHg. The patient developed significant symptoms which included chest and abdominal pain. The chest pain was relieved with rest. The resting and stress electrocardiogram demonstrated normal sinus rhythm, normal resting conduction, occasional PVC's and nonspecific ST-T changes.  Stress EKG is non diagnostic for ischemia as it is a pharmacologic stress.   2. The overall quality of the study is excellent. There is no evidence of abnormal lung activity. Stress  and rest SPECT images demonstrate homogeneous tracer distribution throughout the myocardium. Gated SPECT imaging reveals normal myocardial thickening and wall motion. The left ventricular ejection fraction was normal (52%).   3. Low risk study.  Left heart catheterization 09/20/2019: LM: Normal LAD: Mild diffuse disease and late lumen loss within prior Proximal and Mid LAD stent 3x22 and 3x38 Xience Prime DES patent ( (05/27/13). No significant ISR. 100% occluded distal apical LAD. Right to left collaterals from distal RCA. LCx: Minimal late lumen loss within prior prox LCx stent. No significant ISR. RCA: Minimal late lumen loss within mid RCA stent. No significant ISR of mid RCA 3.0 x 22 mm resolute stent placed on 06/10/2011.  Scoring balloon angioplasty at the PDA stent widely patent. Normal LVEDP.   Continue medical management for CAD.  Overall no significant change from angiography: 05/24/2015. Recommend pulmonary follow up regarding possible COPD.  Carotid artery duplex 02/27/2023: Duplex suggests stenosis in the right internal carotid artery (50-69%). < 50% stenosis in the right external carotid artery. Duplex suggests stenosis in the left internal carotid artery (minimal). < 50% stenosis in the left common carotid artery. Antegrade right   vertebral artery flow. Antegrade left vertebral artery flow.  No significant change from 09/10/2022. Follow up in six months is appropriate if clinically indicated.  PCV ECHOCARDIOGRAM COMPLETE 03/13/2023  Narrative Echocardiogram 03/13/2023: Normal LV systolic function with visual EF 60-65%. Left ventricle cavity is normal in size. Normal left ventricular wall thickness. Normal global wall motion. Normal diastolic filling pattern, normal LAP. Trace aortic regurgitation. Aortic valve sclerosis without stenosis. Mild (Grade I) mitral regurgitation. Mild tricuspid regurgitation. No evidence of pulmonary hypertension. No prior study for comparison.     EKG:  EKG 05/22/2023: Normal sinus rhythm at rate of 54 bpm, normal EKG.  Single PAC.   Assessment:     ICD-10-CM   1. Shortness of breath  R06.02 EKG 12-Lead    Basic metabolic panel    CBC    2. Coronary artery disease involving native coronary artery of native heart without angina pectoris  I25.10 Basic metabolic panel    CBC    3. Primary hypertension  I10       No orders of the defined types were placed in this encounter.    Medications Discontinued During This Encounter  Medication Reason   metoprolol succinate (TOPROL-XL) 50 MG 24 hr tablet Change in therapy   amLODipine (NORVASC) 10 MG tablet Change in therapy   atorvastatin (LIPITOR) 80 MG tablet Change in therapy     Orders Placed This Encounter  Procedures   Basic metabolic panel   CBC   EKG 12-Lead     Recommendations:   Rasmus W Pew  is a 76 y.o. with history of known coronary artery disease with stenting to mid LAD and circumflex coronary artery on 05/28/2011 and to mid RCA on 06/10/2011 with DES and nondrug eluding stent to the right coronary artery and balloon PTCA to PDA on 05/24/2015, last cardiac catheterization on 09/20/2019 revealing widely patent vessels with very mild neointimal hyperplasia in the LAD.  He also has asymptomatic bilateral carotid stenosis, controlled diabetes with stage IIIa chronic kidney disease, mixed hyperlipidemia and hypertension.  1. Shortness of breath Patient continues to have marked dyspnea on exertion, even doing minimal activities, he now has class III dyspnea.  Fortunately no clinical evidence of heart failure and EKG reveals normal sinus rhythm.  Recently performed echocardiogram reveals normal LV systolic function.  He has significant cardiovascular factors, is also having occasional episodes of chest pain, given this he is already on very appropriate and guideline directed medical therapy with excellent control of diabetes, hypertension and hyperlipidemia, he is also on 2  antianginal medications, hence we will proceed with left and right heart catheterization possible coronary angioplasty. - EKG 12-Lead - Basic metabolic panel - CBC  2. Coronary artery disease involving native coronary artery of native heart without angina pectoris As dictated above, patient has had diffuse disease, he has had prior angioplasty, will proceed with heart catheterization.  Schedule for cardiac catheterization, and possible angioplasty. We discussed regarding risks, benefits, alternatives to this including stress testing, CTA and continued medical therapy. Patient wants to proceed. Understands <1-2% risk of death, stroke, MI, urgent CABG, bleeding, infection, renal failure but not limited to these.   - Basic metabolic panel - CBC  3. Primary hypertension Blood pressure in excellent control, recently has established with new PCP, Amy Stevens, NP.  His medications have been discontinued and changed including beta-blocker therapy and calcium channel blocker therapy however on appropriate medical therapy and appropriate changes were made, with excellent control of his risk factors.  Further   recommendations to follow following cardiac catheterization and I will see him back in 6 weeks following catheterization.  Curley Hogen, MD, FACC 05/22/2023, 9:59 AM Office: [phone] Pager: [phone]  

## 2023-05-22 NOTE — Progress Notes (Signed)
Primary Physician:  Rema Fendt, NP   Patient ID: Miguel Brock, male    DOB: 1946/03/14, 77 y.o.   MRN: [id number]  Subjective:    Chief Complaint  Patient presents with   Coronary artery disease involving native coronary artery of   Follow-up    Sob, Light headed    HPI: Miguel Brock  is a 77 y.o. male  with history of known coronary artery disease with stenting to mid LAD and circumflex coronary artery on 05/28/2011 and to mid RCA on 06/10/2011 with DES and nondrug eluding stent to the right coronary artery and balloon PTCA to PDA on 05/24/2015, last cardiac catheterization on 09/20/2019 revealing widely patent vessels with very mild neointimal hyperplasia in the LAD.  He also has asymptomatic bilateral carotid stenosis, controlled diabetes with stage IIIa chronic kidney disease, mixed hyperlipidemia and hypertension.  He was seen by me 3 months ago, he has noticed worsening dyspnea on exertion.  Now he states that dyspnea is severe enough that even doing minimal activity he has to rest fairly often.  He has occasionally has had some sharp chest pain.  He has not used any sublingual nitroglycerin.  He has been very concerned about progression of coronary artery disease.  He has been compliant with all his medications.  Past Medical History:  Diagnosis Date   Alpha galactosidase deficiency    Arthritis    Bursitis of left hip    Carotid stenosis    Stenosis in the right internal carotid artery (50-69%). Stenosis in the right external carotid artery (<50%).  Stenosis in the left external carotid artery (<50%).  Antegrade right vertebral artery flow. Antegrade left vertebral artery flow.   Chest pain    Chronic post-traumatic stress disorder 08/21/2022   COPD (chronic obstructive pulmonary disease) (HCC)    Coronary artery disease 11,15   stents x7   Diabetes mellitus without complication (HCC)    TYPE 2   Dyspnea    Essential (primary) hypertension 03/30/2019   GERD  (gastroesophageal reflux disease)    zantac   H/O hiatal hernia    Hepatitis 70's   food treatment for it in the 80's   History of migraine    Hypertension    Moderate major depression (HCC) 08/21/2022   Trochanteric bursitis of left hip 05/24/2014   Social History   Tobacco Use   Smoking status: Former    Packs/day: 1.50    Years: 23.00    Additional pack years: 0.00    Total pack years: 34.50    Types: Cigarettes    Quit date: 07/05/1981    Years since quitting: 41.9    Passive exposure: Past   Smokeless tobacco: Never   Tobacco comments:    cuban cigars once/[place]  Substance Use Topics   Alcohol use: Yes    Alcohol/week: 14.0 standard drinks of alcohol    Types: 14 Standard drinks or equivalent per week    Comment: pint per week   Marital Status: Married    Review of Systems  Cardiovascular:  Positive for dyspnea on exertion. Negative for chest pain and leg swelling.   Objective:      05/22/2023    9:26 AM 03/06/2023    2:09 PM 08/21/2022    8:24 AM  Vitals with BMI  Height 5\' 10"  5\' 10"    Weight 215 lbs 215 lbs 3 oz   BMI 30.85 30.88   Systolic 123 130 409  Diastolic 71 59 55  Pulse 56 53        Physical Exam Constitutional:      Appearance: He is well-developed.     Comments: Mildly obese  Neck:     Vascular: Carotid bruit (bilateral) present. No JVD.  Cardiovascular:     Rate and Rhythm: Normal rate and regular rhythm.     Pulses: Normal pulses and intact distal pulses.     Heart sounds: Murmur heard.     Early systolic murmur is present with a grade of 2/6 at the upper right sternal border.  Pulmonary:     Effort: Pulmonary effort is normal. No accessory muscle usage or respiratory distress.     Breath sounds: Normal breath sounds.  Abdominal:     General: Bowel sounds are normal.     Palpations: Abdomen is soft.    Radiology: No results found.  Laboratory examination:   Lab Results  Component Value Date   NA 139 02/01/2022   K 4.2  02/01/2022   CO2 23 02/01/2022   GLUCOSE 123 (H) 02/01/2022   BUN 21 02/01/2022   CREATININE 1.51 (H) 02/01/2022   CALCIUM 9.7 02/01/2022   EGFR 48 (L) 02/01/2022   GFRNONAA 40 (L) 01/08/2021       Latest Ref Rng & Units 02/01/2022    7:26 AM 01/08/2021    9:26 AM 12/11/2020    1:43 PM  CMP  Glucose 70 - 99 mg/dL 161  096  045   BUN 8 - 27 mg/dL 21  25  22    Creatinine 0.76 - 1.27 mg/dL 4.09  8.11  9.14   Sodium 134 - 144 mmol/L 139  141  141   Potassium 3.5 - 5.2 mmol/L 4.2  4.3  4.1   Chloride 96 - 106 mmol/L 101  103  105   CO2 20 - 29 mmol/L 23  21  23    Calcium 8.6 - 10.2 mg/dL 9.7  78.2  9.3   Total Protein 6.0 - 8.5 g/dL 6.7  7.1    Total Bilirubin 0.0 - 1.2 mg/dL 0.4  0.3    Alkaline Phos 44 - 121 IU/L 91  115    AST 0 - 40 IU/L 16  13    ALT 0 - 44 IU/L 19  15         Latest Ref Rng & Units 02/01/2022    7:25 AM 01/08/2021    9:26 AM 12/11/2020    1:43 PM  CBC  WBC 3.4 - 10.8 x10E3/uL 8.5  8.3  8.8   Hemoglobin 13.0 - 17.7 g/dL 95.6  21.3  08.6   Hematocrit 37.5 - 51.0 % 37.8  36.9  38.9   Platelets 150 - 450 x10E3/uL 258  335  256    Lipid Panel     Component Value Date/Time   CHOL 118 02/01/2022 0729   TRIG 217 (H) 02/01/2022 0729   HDL 47 02/01/2022 0729   LDLCALC 37 02/01/2022 0729   HEMOGLOBIN A1C Lab Results  Component Value Date   HGBA1C 7.1 (A) 08/21/2022   MPG 131.24 12/11/2020   Lab Results  Component Value Date   TSH 5.770 (H) 02/01/2022     External labs:   Labs 12/02/2022,  A1c 6.7%.  Urine albumin 42.  TSH normal at 0.09.  Total cholesterol 108, triglycerides 182, HDL 42, LDL 30.  Sodium 138, potassium 3.8, BUN 30, creatinine 1.59, EGFR 44 mL, LFTs normal.   Current Outpatient Medications:    acetaminophen (TYLENOL)  500 MG tablet, Take 1,000 mg by mouth at bedtime., Disp: , Rfl:    amLODipine-atorvastatin (CADUET) 10-40 MG tablet, Take 1 tablet by mouth daily., Disp: , Rfl:    aspirin EC 81 MG tablet, Take 81 mg by mouth  daily., Disp: , Rfl:    atenolol-chlorthalidone (TENORETIC) 50-25 MG tablet, Take 1 tablet by mouth daily., Disp: , Rfl:    cetirizine (ZYRTEC) 10 MG tablet, Take 10 mg by mouth daily., Disp: , Rfl:    chlorthalidone (HYGROTON) 25 MG tablet, TAKE 1 TABLET EVERY MORNING, Disp: 90 tablet, Rfl: 3   cyclobenzaprine (FLEXERIL) 10 MG tablet, Take 1 tablet (10 mg total) by mouth 3 (three) times daily as needed for muscle spasms., Disp: 30 tablet, Rfl: 1   docusate sodium (COLACE) 100 MG capsule, Take 100 mg by mouth daily., Disp: , Rfl:    ezetimibe (ZETIA) 10 MG tablet, TAKE 1 TABLET DAILY, Disp: 90 tablet, Rfl: 3   JANUMET XR 50-1000 MG TB24, Take 1 tablet by mouth daily., Disp: , Rfl:    losartan (COZAAR) 100 MG tablet, TAKE 1 TABLET DAILY (Patient taking differently: Take 50 mg by mouth daily.), Disp: 90 tablet, Rfl: 3   meloxicam (MOBIC) 15 MG tablet, Take 15 mg by mouth daily., Disp: , Rfl:    nitroGLYCERIN (NITROSTAT) 0.4 MG SL tablet, Place 1 tablet (0.4 mg total) under the tongue every 5 (five) minutes as needed for chest pain., Disp: 25 tablet, Rfl: 1   omeprazole (PRILOSEC) 20 MG capsule, Take 20 mg by mouth as needed., Disp: , Rfl:    traMADol (ULTRAM) 50 MG tablet, Take 50 mg by mouth 2 (two) times daily as needed for pain., Disp: , Rfl:       Cardiac Studies:   Exercise myoview stress 03/03/2019: 1. Lexiscan stress test with low level exercise was performed. Resting blood pressure was 146/70 mmHg and peak effect blood pressure was 172/64 mmHg. The patient developed significant symptoms which included chest and abdominal pain. The chest pain was relieved with rest. The resting and stress electrocardiogram demonstrated normal sinus rhythm, normal resting conduction, occasional PVC's and nonspecific ST-T changes.  Stress EKG is non diagnostic for ischemia as it is a pharmacologic stress.   2. The overall quality of the study is excellent. There is no evidence of abnormal lung activity. Stress  and rest SPECT images demonstrate homogeneous tracer distribution throughout the myocardium. Gated SPECT imaging reveals normal myocardial thickening and wall motion. The left ventricular ejection fraction was normal (52%).   3. Low risk study.  Left heart catheterization 09/20/2019: LM: Normal LAD: Mild diffuse disease and late lumen loss within prior Proximal and Mid LAD stent 3x22 and 3x38 Xience Prime DES patent ( (05/27/13). No significant ISR. 100% occluded distal apical LAD. Right to left collaterals from distal RCA. LCx: Minimal late lumen loss within prior prox LCx stent. No significant ISR. RCA: Minimal late lumen loss within mid RCA stent. No significant ISR of mid RCA 3.0 x 22 mm resolute stent placed on 06/10/2011.  Scoring balloon angioplasty at the PDA stent widely patent. Normal LVEDP.   Continue medical management for CAD.  Overall no significant change from angiography: 05/24/2015. Recommend pulmonary follow up regarding possible COPD.  Carotid artery duplex 02/27/2023: Duplex suggests stenosis in the right internal carotid artery (50-69%). < 50% stenosis in the right external carotid artery. Duplex suggests stenosis in the left internal carotid artery (minimal). < 50% stenosis in the left common carotid artery. Antegrade right  vertebral artery flow. Antegrade left vertebral artery flow.  No significant change from 09/10/2022. Follow up in six months is appropriate if clinically indicated.  PCV ECHOCARDIOGRAM COMPLETE 03/13/2023  Narrative Echocardiogram 03/13/2023: Normal LV systolic function with visual EF 60-65%. Left ventricle cavity is normal in size. Normal left ventricular wall thickness. Normal global wall motion. Normal diastolic filling pattern, normal LAP. Trace aortic regurgitation. Aortic valve sclerosis without stenosis. Mild (Grade I) mitral regurgitation. Mild tricuspid regurgitation. No evidence of pulmonary hypertension. No prior study for comparison.     EKG:  EKG 05/22/2023: Normal sinus rhythm at rate of 54 bpm, normal EKG.  Single PAC.   Assessment:     ICD-10-CM   1. Shortness of breath  R06.02 EKG 12-Lead    Basic metabolic panel    CBC    2. Coronary artery disease involving native coronary artery of native heart without angina pectoris  I25.10 Basic metabolic panel    CBC    3. Primary hypertension  I10       No orders of the defined types were placed in this encounter.    Medications Discontinued During This Encounter  Medication Reason   metoprolol succinate (TOPROL-XL) 50 MG 24 hr tablet Change in therapy   amLODipine (NORVASC) 10 MG tablet Change in therapy   atorvastatin (LIPITOR) 80 MG tablet Change in therapy     Orders Placed This Encounter  Procedures   Basic metabolic panel   CBC   EKG 12-Lead     Recommendations:   BILLE PALLONE  is a 77 y.o. with history of known coronary artery disease with stenting to mid LAD and circumflex coronary artery on 05/28/2011 and to mid RCA on 06/10/2011 with DES and nondrug eluding stent to the right coronary artery and balloon PTCA to PDA on 05/24/2015, last cardiac catheterization on 09/20/2019 revealing widely patent vessels with very mild neointimal hyperplasia in the LAD.  He also has asymptomatic bilateral carotid stenosis, controlled diabetes with stage IIIa chronic kidney disease, mixed hyperlipidemia and hypertension.  1. Shortness of breath Patient continues to have marked dyspnea on exertion, even doing minimal activities, he now has class III dyspnea.  Fortunately no clinical evidence of heart failure and EKG reveals normal sinus rhythm.  Recently performed echocardiogram reveals normal LV systolic function.  He has significant cardiovascular factors, is also having occasional episodes of chest pain, given this he is already on very appropriate and guideline directed medical therapy with excellent control of diabetes, hypertension and hyperlipidemia, he is also on 2  antianginal medications, hence we will proceed with left and right heart catheterization possible coronary angioplasty. - EKG 12-Lead - Basic metabolic panel - CBC  2. Coronary artery disease involving native coronary artery of native heart without angina pectoris As dictated above, patient has had diffuse disease, he has had prior angioplasty, will proceed with heart catheterization.  Schedule for cardiac catheterization, and possible angioplasty. We discussed regarding risks, benefits, alternatives to this including stress testing, CTA and continued medical therapy. Patient wants to proceed. Understands <1-2% risk of death, stroke, MI, urgent CABG, bleeding, infection, renal failure but not limited to these.   - Basic metabolic panel - CBC  3. Primary hypertension Blood pressure in excellent control, recently has established with new PCP, Delfin Gant, NP.  His medications have been discontinued and changed including beta-blocker therapy and calcium channel blocker therapy however on appropriate medical therapy and appropriate changes were made, with excellent control of his risk factors.  Further  recommendations to follow following cardiac catheterization and I will see him back in 6 weeks following catheterization.  Yates Decamp, MD, [facility] 05/22/2023, 9:59 AM Office: [phone] Pager: [phone]

## 2023-06-03 LAB — BASIC METABOLIC PANEL
BUN/Creatinine Ratio: 11 (ref 10–24)
BUN: 21 mg/dL (ref 8–27)
CO2: 21 mmol/L (ref 20–29)
Calcium: 10.1 mg/dL (ref 8.6–10.2)
Chloride: 104 mmol/L (ref 96–106)
Creatinine, Ser: 1.95 mg/dL — ABNORMAL HIGH (ref 0.76–1.27)
Glucose: 152 mg/dL — ABNORMAL HIGH (ref 70–99)
Potassium: 5 mmol/L (ref 3.5–5.2)
Sodium: 140 mmol/L (ref 134–144)
eGFR: 35 mL/min/{1.73_m2} — ABNORMAL LOW (ref >59)

## 2023-06-03 LAB — CBC
Hematocrit: 36.4 % — ABNORMAL LOW (ref 37.5–51.0)
Hemoglobin: 12.2 g/dL — ABNORMAL LOW (ref 13.0–17.7)
MCH: 31.4 pg (ref 26.6–33.0)
MCHC: 33.5 g/dL (ref 31.5–35.7)
MCV: 94 fL (ref 79–97)
Platelets: 287 10*3/uL (ref 150–450)
RBC: 3.89 x10E6/uL — ABNORMAL LOW (ref 4.14–5.80)
RDW: 12.6 % (ref 11.6–15.4)
WBC: 10.4 10*3/uL (ref 3.4–10.8)

## 2023-06-03 NOTE — Addendum Note (Signed)
Addended by: Delrae Rend on: 06/03/2023 07:28 AM   Modules accepted: Orders

## 2023-06-06 ENCOUNTER — Other Ambulatory Visit: Payer: Self-pay

## 2023-06-06 ENCOUNTER — Ambulatory Visit (HOSPITAL_COMMUNITY)
Admission: RE | Admit: 2023-06-06 | Discharge: 2023-06-06 | Disposition: A | Discharge status: Home / Self Care | Payer: Medicare Other | Attending: Cardiology | Admitting: Cardiology

## 2023-06-06 ENCOUNTER — Encounter (HOSPITAL_COMMUNITY)
Admission: RE | Disposition: A | Discharge status: Home / Self Care | Payer: Self-pay | Source: Home / Self Care | Attending: Cardiology

## 2023-06-06 DIAGNOSIS — I2582 Chronic total occlusion of coronary artery: Secondary | ICD-10-CM | POA: Diagnosis not present

## 2023-06-06 DIAGNOSIS — I6523 Occlusion and stenosis of bilateral carotid arteries: Secondary | ICD-10-CM | POA: Diagnosis not present

## 2023-06-06 DIAGNOSIS — I129 Hypertensive chronic kidney disease with stage 1 through stage 4 chronic kidney disease, or unspecified chronic kidney disease: Secondary | ICD-10-CM | POA: Insufficient documentation

## 2023-06-06 DIAGNOSIS — Z87891 Personal history of nicotine dependence: Secondary | ICD-10-CM | POA: Insufficient documentation

## 2023-06-06 DIAGNOSIS — R0609 Other forms of dyspnea: Secondary | ICD-10-CM | POA: Insufficient documentation

## 2023-06-06 DIAGNOSIS — Z955 Presence of coronary angioplasty implant and graft: Secondary | ICD-10-CM | POA: Insufficient documentation

## 2023-06-06 DIAGNOSIS — I2510 Atherosclerotic heart disease of native coronary artery without angina pectoris: Secondary | ICD-10-CM | POA: Diagnosis present

## 2023-06-06 DIAGNOSIS — E782 Mixed hyperlipidemia: Secondary | ICD-10-CM | POA: Insufficient documentation

## 2023-06-06 DIAGNOSIS — I25118 Atherosclerotic heart disease of native coronary artery with other forms of angina pectoris: Secondary | ICD-10-CM

## 2023-06-06 DIAGNOSIS — N1831 Chronic kidney disease, stage 3a: Secondary | ICD-10-CM | POA: Insufficient documentation

## 2023-06-06 DIAGNOSIS — E1122 Type 2 diabetes mellitus with diabetic chronic kidney disease: Secondary | ICD-10-CM | POA: Insufficient documentation

## 2023-06-06 DIAGNOSIS — I272 Pulmonary hypertension, unspecified: Secondary | ICD-10-CM | POA: Insufficient documentation

## 2023-06-06 HISTORY — PX: RIGHT/LEFT HEART CATH AND CORONARY ANGIOGRAPHY: CATH118266

## 2023-06-06 LAB — POCT I-STAT, CHEM 8
BUN: 25 mg/dL — ABNORMAL HIGH (ref 8–23)
Calcium, Ion: 1.3 mmol/L (ref 1.15–1.40)
Chloride: 104 mmol/L (ref 98–111)
Creatinine, Ser: 2.1 mg/dL — ABNORMAL HIGH (ref 0.61–1.24)
Glucose, Bld: 150 mg/dL — ABNORMAL HIGH (ref 70–99)
HCT: 34 % — ABNORMAL LOW (ref 39.0–52.0)
Hemoglobin: 11.6 g/dL — ABNORMAL LOW (ref 13.0–17.0)
Potassium: 4 mmol/L (ref 3.5–5.1)
Sodium: 141 mmol/L (ref 135–145)
TCO2: 26 mmol/L (ref 22–32)

## 2023-06-06 LAB — GLUCOSE, CAPILLARY: Glucose-Capillary: 136 mg/dL — ABNORMAL HIGH (ref 70–99)

## 2023-06-06 LAB — POCT I-STAT 7, (LYTES, BLD GAS, ICA,H+H)
Acid-base deficit: 6 mmol/L — ABNORMAL HIGH (ref 0.0–2.0)
Bicarbonate: 19.6 mmol/L — ABNORMAL LOW (ref 20.0–28.0)
Calcium, Ion: 1.22 mmol/L (ref 1.15–1.40)
HCT: 33 % — ABNORMAL LOW (ref 39.0–52.0)
Hemoglobin: 11.2 g/dL — ABNORMAL LOW (ref 13.0–17.0)
O2 Saturation: 94 %
Potassium: 3.8 mmol/L (ref 3.5–5.1)
Sodium: 139 mmol/L (ref 135–145)
TCO2: 21 mmol/L — ABNORMAL LOW (ref 22–32)
pCO2 arterial: 37.3 mmHg (ref 32–48)
pH, Arterial: 7.329 — ABNORMAL LOW (ref 7.35–7.45)
pO2, Arterial: 76 mmHg — ABNORMAL LOW (ref 83–108)

## 2023-06-06 LAB — POCT I-STAT EG7
Acid-base deficit: 5 mmol/L — ABNORMAL HIGH (ref 0.0–2.0)
Bicarbonate: 21.2 mmol/L (ref 20.0–28.0)
Calcium, Ion: 1.2 mmol/L (ref 1.15–1.40)
HCT: 32 % — ABNORMAL LOW (ref 39.0–52.0)
Hemoglobin: 10.9 g/dL — ABNORMAL LOW (ref 13.0–17.0)
O2 Saturation: 69 %
Potassium: 3.7 mmol/L (ref 3.5–5.1)
Sodium: 140 mmol/L (ref 135–145)
TCO2: 23 mmol/L (ref 22–32)
pCO2, Ven: 42.9 mmHg — ABNORMAL LOW (ref 44–60)
pH, Ven: 7.303 (ref 7.25–7.43)
pO2, Ven: 39 mmHg (ref 32–45)

## 2023-06-06 SURGERY — RIGHT/LEFT HEART CATH AND CORONARY ANGIOGRAPHY
Anesthesia: LOCAL

## 2023-06-06 MED ORDER — SODIUM CHLORIDE 0.9% FLUSH: 3.0000 mL | INTRAVENOUS | Status: DC | PRN

## 2023-06-06 MED ORDER — SODIUM CHLORIDE 0.9 % WEIGHT BASED INFUSION: 3.0000 mL/kg/h | INTRAVENOUS | Status: AC

## 2023-06-06 MED ORDER — FENTANYL CITRATE (PF) 100 MCG/2ML IJ SOLN
INTRAMUSCULAR | Status: DC | PRN
  Administered 2023-06-06: 50 ug via INTRAVENOUS

## 2023-06-06 MED ORDER — VERAPAMIL HCL 2.5 MG/ML IV SOLN
INTRAVENOUS | Status: DC | PRN
  Administered 2023-06-06: 5 mL via INTRA_ARTERIAL

## 2023-06-06 MED ORDER — ONDANSETRON HCL 4 MG/2ML IJ SOLN: 4.0000 mg | Freq: Four times a day (QID) | INTRAMUSCULAR | Status: DC | PRN

## 2023-06-06 MED ORDER — FENTANYL CITRATE (PF) 100 MCG/2ML IJ SOLN
INTRAMUSCULAR | Status: AC
  Filled 2023-06-06: qty 2

## 2023-06-06 MED ORDER — HEPARIN SODIUM (PORCINE) 1000 UNIT/ML IJ SOLN
INTRAMUSCULAR | Status: DC | PRN
  Administered 2023-06-06: 4500 [IU] via INTRAVENOUS

## 2023-06-06 MED ORDER — SODIUM CHLORIDE 0.9 % IV SOLN: 250.0000 mL | INTRAVENOUS | Status: DC | PRN

## 2023-06-06 MED ORDER — SODIUM CHLORIDE 0.9% FLUSH: 3.0000 mL | Freq: Two times a day (BID) | INTRAVENOUS | Status: DC

## 2023-06-06 MED ORDER — ACETAMINOPHEN 325 MG PO TABS: 650.0000 mg | ORAL_TABLET | ORAL | Status: DC | PRN

## 2023-06-06 MED ORDER — IOHEXOL 350 MG/ML SOLN
INTRAVENOUS | Status: DC | PRN
  Administered 2023-06-06: 25 mL

## 2023-06-06 MED ORDER — SODIUM CHLORIDE 0.9 % WEIGHT BASED INFUSION
3.0000 mL/kg/h | INTRAVENOUS | Status: DC
  Administered 2023-06-06: 279 mL/kg/h via INTRAVENOUS

## 2023-06-06 MED ORDER — MIDAZOLAM HCL 2 MG/2ML IJ SOLN
INTRAMUSCULAR | Status: AC
  Filled 2023-06-06: qty 2

## 2023-06-06 MED ORDER — MIDAZOLAM HCL 2 MG/2ML IJ SOLN
INTRAMUSCULAR | Status: DC | PRN
  Administered 2023-06-06: 2 mg via INTRAVENOUS

## 2023-06-06 MED ORDER — HEPARIN SODIUM (PORCINE) 1000 UNIT/ML IJ SOLN
INTRAMUSCULAR | Status: AC
  Filled 2023-06-06: qty 10

## 2023-06-06 MED ORDER — LIDOCAINE HCL (PF) 1 % IJ SOLN
INTRAMUSCULAR | Status: AC
  Filled 2023-06-06: qty 30

## 2023-06-06 MED ORDER — LIDOCAINE HCL (PF) 1 % IJ SOLN
INTRAMUSCULAR | Status: DC | PRN
  Administered 2023-06-06 (×2): 5 mL via INTRADERMAL

## 2023-06-06 MED ORDER — ASPIRIN 81 MG PO CHEW
81.0000 mg | CHEWABLE_TABLET | ORAL | Status: DC
Start: 2023-06-07 — End: 2023-06-06

## 2023-06-06 MED ORDER — VERAPAMIL HCL 2.5 MG/ML IV SOLN
INTRAVENOUS | Status: AC
  Filled 2023-06-06: qty 2

## 2023-06-06 MED ORDER — SODIUM CHLORIDE 0.9 % WEIGHT BASED INFUSION: 1.0000 mL/kg/h | INTRAVENOUS | Status: DC

## 2023-06-06 MED ORDER — ASPIRIN 81 MG PO CHEW: 81.0000 mg | CHEWABLE_TABLET | ORAL | Status: DC

## 2023-06-06 MED ORDER — HEPARIN (PORCINE) IN NACL 1000-0.9 UT/500ML-% IV SOLN
INTRAVENOUS | Status: DC | PRN
  Administered 2023-06-06 (×2): 500 mL

## 2023-06-06 SURGICAL SUPPLY — 16 items
BAND CMPR LRG ZPHR (HEMOSTASIS) ×1
BAND ZEPHYR COMPRESS 30 LONG (HEMOSTASIS) IMPLANT
CATH BALLN WEDGE 5F 110CM (CATHETERS) IMPLANT
CATH OPTITORQUE TIG 4.0 5F (CATHETERS) IMPLANT
ELECT DEFIB PAD ADLT CADENCE (PAD) IMPLANT
GLIDESHEATH SLEND A-KIT 6F 22G (SHEATH) IMPLANT
GUIDEWIRE .025 260CM (WIRE) IMPLANT
GUIDEWIRE INQWIRE 1.5J.035X260 (WIRE) IMPLANT
INQWIRE 1.5J .035X260CM (WIRE) ×1
KIT HEART LEFT (KITS) ×1 IMPLANT
KIT MICROPUNCTURE NIT STIFF (SHEATH) IMPLANT
PACK CARDIAC CATHETERIZATION (CUSTOM PROCEDURE TRAY) ×1 IMPLANT
SHEATH GLIDE SLENDER 4/5FR (SHEATH) IMPLANT
SHEATH PINNACLE 5F 10CM (SHEATH) IMPLANT
TRANSDUCER W/STOPCOCK (MISCELLANEOUS) ×1 IMPLANT
TUBING CIL FLEX 10 FLL-RA (TUBING) ×1 IMPLANT

## 2023-06-06 NOTE — Discharge Instructions (Signed)
Radial Site Care  This sheet gives you information about how to care for yourself after your procedure. Your health care provider may also give you more specific instructions. If you have problems or questions, contact your health care provider. What can I expect after the procedure? After the procedure, it is common to have: Bruising and tenderness at the catheter insertion area. Follow these instructions at home: Medicines Take over-the-counter and prescription medicines only as told by your health care provider. Insertion site care Follow instructions from your health care provider about how to take care of your insertion site. Make sure you: Wash your hands with soap and water before you remove your bandage (dressing). If soap and water are not available, use hand sanitizer. May remove dressing in 24 hours. Check your insertion site every day for signs of infection. Check for: Redness, swelling, or pain. Fluid or blood. Pus or a bad smell. Warmth. Do no take baths, swim, or use a hot tub for 5 days. You may shower 24-48 hours after the procedure. Remove the dressing and gently wash the site with plain soap and water. Pat the area dry with a clean towel. Do not rub the site. That could cause bleeding. Do not apply powder or lotion to the site. Activity  For 24 hours after the procedure, or as directed by your health care provider: Do not flex or bend the affected arm. Do not push or pull heavy objects with the affected arm. Do not drive yourself home from the hospital or clinic. You may drive 24 hours after the procedure. Do not operate machinery or power tools. KEEP ARM ELEVATED THE REMAINDER OF THE DAY. Do not push, pull or lift anything that is heavier than 10 lb for 5 days. Ask your health care provider when it is okay to: Return to work or school. Resume usual physical activities or sports. Resume sexual activity. General instructions If the catheter site starts to  bleed, raise your arm and put firm pressure on the site. If the bleeding does not stop, get help right away. This is a medical emergency. DRINK PLENTY OF FLUIDS FOR THE NEXT 2-3 DAYS. No alcohol consumption for 24 hours after receiving sedation. If you went home on the same day as your procedure, a responsible adult should be with you for the first 24 hours after you arrive home. Keep all follow-up visits as told by your health care provider. This is important. Contact a health care provider if: You have a fever. You have redness, swelling, or yellow drainage around your insertion site. Get help right away if: You have unusual pain at the radial site. The catheter insertion area swells very fast. The insertion area is bleeding, and the bleeding does not stop when you hold steady pressure on the area. Your arm or hand becomes pale, cool, tingly, or numb. These symptoms may represent a serious problem that is an emergency. Do not wait to see if the symptoms will go away. Get medical help right away. Call your local emergency services (911 in the [place] Do not drive yourself to the hospital. Summary After the procedure, it is common to have bruising and tenderness at the site. Follow instructions from your health care provider about how to take care of your radial site wound. Check the wound every day for signs of infection.  This information is not intended to replace advice given to you by your health care provider. Make sure you discuss any questions you have with   your health care provider. Document Revised: 01/21/2018 Document Reviewed: 01/21/2018 Elsevier Patient Education  2020 [employer]     Femoral Site Care This sheet gives you information about how to care for yourself after your procedure. Your health care provider may also give you more specific instructions. If you have problems or questions, contact your health care provider. What can I expect after the procedure?  After  the procedure, it is common to have: Bruising that usually fades within 1-2 weeks. Tenderness at the site. Follow these instructions at home: Wound care Follow instructions from your health care provider about how to take care of your insertion site. Make sure you: Wash your hands with soap and water before you change your bandage (dressing). If soap and water are not available, use hand sanitizer. Remove your dressing as told by your health care provider. 24 hours Do not take baths, swim, or use a hot tub until your health care provider approves. You may shower 24-48 hours after the procedure or as told by your health care provider. Gently wash the site with plain soap and water. Pat the area dry with a clean towel. Do not rub the site. This may cause bleeding. Do not apply powder or lotion to the site. Keep the site clean and dry. Check your femoral site every day for signs of infection. Check for: Redness, swelling, or pain. Fluid or blood. Warmth. Pus or a bad smell. Activity For the first 2-3 days after your procedure, or as long as directed: Avoid climbing stairs as much as possible. Do not squat. Do not lift anything that is heavier than 10 lb (4.5 kg), or the limit that you are told, until your health care provider says that it is safe. For 5 days Rest as directed. Avoid sitting for a long time without moving. Get up to take short walks every 1-2 hours. Do not drive for 24 hours if you were given a medicine to help you relax (sedative). General instructions Take over-the-counter and prescription medicines only as told by your health care provider. Keep all follow-up visits as told by your health care provider. This is important. Contact a health care provider if you have: A fever or chills. You have redness, swelling, or pain around your insertion site. Get help right away if: The catheter insertion area swells very fast. You pass out. You suddenly start to sweat or your  skin gets clammy. The catheter insertion area is bleeding, and the bleeding does not stop when you hold steady pressure on the area. The area near or just beyond the catheter insertion site becomes pale, cool, tingly, or numb. These symptoms may represent a serious problem that is an emergency. Do not wait to see if the symptoms will go away. Get medical help right away. Call your local emergency services (911 in the [place] Do not drive yourself to the hospital. Summary After the procedure, it is common to have bruising that usually fades within 1-2 weeks. Check your femoral site every day for signs of infection. Do not lift anything that is heavier than 10 lb (4.5 kg), or the limit that you are told, until your health care provider says that it is safe. This information is not intended to replace advice given to you by your health care provider. Make sure you discuss any questions you have with your health care provider. Document Revised: 12/29/2017 Document Reviewed: 12/29/2017 Elsevier Patient Education  2020 [employer].

## 2023-06-06 NOTE — Interval H&P Note (Signed)
History and Physical Interval Note:  06/06/2023 8:09 AM  Miguel Brock  has presented today for surgery, with the diagnosis of cad.  The various methods of treatment have been discussed with the patient and family. After consideration of risks, benefits and other options for treatment, the patient has consented to  Procedure(s): RIGHT/LEFT HEART CATH AND CORONARY ANGIOGRAPHY (N/A) and possible coronary intervention as a surgical intervention.  The patient's history has been reviewed, patient examined, no change in status, stable for surgery.  I have reviewed the patient's chart and labs.  Questions were answered to the patient's satisfaction.    Cath Lab Visit (complete for each Cath Lab visit)  Clinical Evaluation Leading to the Procedure:   ACS: No.  Non-ACS:    Anginal Classification: CCS III  Anti-ischemic medical therapy: Maximal Therapy (2 or more classes of medications)  Non-Invasive Test Results: No non-invasive testing performed  Prior CABG: No previous CABG   Yates Decamp

## 2023-06-09 ENCOUNTER — Encounter (HOSPITAL_COMMUNITY): Payer: Self-pay | Admitting: Cardiology

## 2023-06-19 ENCOUNTER — Ambulatory Visit: Payer: Medicare Other | Admitting: Cardiology

## 2023-06-19 ENCOUNTER — Encounter: Payer: Self-pay | Admitting: Cardiology

## 2023-06-19 VITALS — BP 138/65 | HR 57 | Ht 71.0 in | Wt 219.6 lb

## 2023-06-19 DIAGNOSIS — R0609 Other forms of dyspnea: Secondary | ICD-10-CM

## 2023-06-19 DIAGNOSIS — I2510 Atherosclerotic heart disease of native coronary artery without angina pectoris: Secondary | ICD-10-CM

## 2023-06-19 NOTE — Progress Notes (Signed)
Primary Physician:  Verlee Rossetti, PA-C   Patient ID: Miguel Brock, male    DOB: 19-Jun-1946, 77 y.o.   MRN: [id number]  Subjective:    Chief Complaint  Patient presents with   Coronary Artery Disease   Shortness of Breath   Follow-up   Hospitalization Follow-up    HPI: Miguel Brock  is a 77 y.o. male  with history of known coronary artery disease with stenting to mid LAD and circumflex coronary artery on 05/28/2011 and to mid RCA on 06/10/2011 and balloon PTCA to PDA on 05/24/2015, asymptomatic bilateral carotid stenosis, controlled diabetes with stage IIIa chronic kidney disease, mixed hyperlipidemia and hypertension.  due to worsening dyspnea, underwent right and left heart catheterization on 06/06/2023 revealing widely patent stents to the LAD and RCA and no significant change in anatomy since cardiac catheterization in 2020.  He now presents for office visit.    He is relieved that coronary arteries are widely patent, he is concerned about ongoing dyspnea.  No PND or orthopnea or leg edema.  He has not had any complications from the procedure.  Past Medical History:  Diagnosis Date   Alpha galactosidase deficiency    Arthritis    Bursitis of left hip    Carotid stenosis    Stenosis in the right internal carotid artery (50-69%). Stenosis in the right external carotid artery (<50%).  Stenosis in the left external carotid artery (<50%).  Antegrade right vertebral artery flow. Antegrade left vertebral artery flow.   Chest pain    Chronic post-traumatic stress disorder 08/21/2022   COPD (chronic obstructive pulmonary disease) (HCC)    Coronary artery disease 11,15   stents x7   Diabetes mellitus without complication (HCC)    TYPE 2   Dyspnea    Essential (primary) hypertension 03/30/2019   GERD (gastroesophageal reflux disease)    zantac   H/O hiatal hernia    Hepatitis 70's   food treatment for it in the 80's   History of migraine    Hypertension    Moderate major  depression (HCC) 08/21/2022   Trochanteric bursitis of left hip 05/24/2014   Social History   Tobacco Use   Smoking status: Former    Packs/day: 1.50    Years: 23.00    Additional pack years: 0.00    Total pack years: 34.50    Types: Cigarettes    Quit date: 07/05/1981    Years since quitting: 41.9    Passive exposure: Past   Smokeless tobacco: Never   Tobacco comments:    cuban cigars once/montly  Substance Use Topics   Alcohol use: Yes    Alcohol/week: 14.0 standard drinks of alcohol    Types: 14 Standard drinks or equivalent per week    Comment: pint per week   Marital Status: Married    Review of Systems  Cardiovascular:  Positive for dyspnea on exertion. Negative for chest pain and leg swelling.   Objective:      06/19/2023    1:42 PM 06/06/2023   11:26 AM 06/06/2023   10:55 AM  Vitals with BMI  Height 5\' 11"     Weight 219 lbs 10 oz    BMI 30.64    Systolic 138 150 782  Diastolic 65 63 71  Pulse 57 51 54       Physical Exam Constitutional:      Appearance: He is well-developed.     Comments: Mildly obese  Neck:     Vascular: Carotid  bruit (bilateral) present. No JVD.  Cardiovascular:     Rate and Rhythm: Normal rate and regular rhythm.     Pulses: Normal pulses and intact distal pulses.     Heart sounds: Murmur heard.     Early systolic murmur is present with a grade of 2/6 at the upper right sternal border.  Pulmonary:     Effort: Pulmonary effort is normal. No accessory muscle usage or respiratory distress.     Breath sounds: Normal breath sounds.  Abdominal:     General: Bowel sounds are normal.     Palpations: Abdomen is soft.    Radiology: No results found.  Laboratory examination:   Lab Results  Component Value Date   NA 140 06/06/2023   K 3.7 06/06/2023   CO2 21 06/02/2023   GLUCOSE 150 (H) 06/06/2023   BUN 25 (H) 06/06/2023   CREATININE 2.10 (H) 06/06/2023   CALCIUM 10.1 06/02/2023   EGFR 35 (L) 06/02/2023   GFRNONAA 40 (L) 01/08/2021        Latest Ref Rng & Units 06/06/2023    9:46 AM 06/06/2023    9:37 AM 06/06/2023    8:36 AM  CMP  Glucose 70 - 99 mg/dL   161   BUN 8 - 23 mg/dL   25   Creatinine 0.96 - 1.24 mg/dL   0.45   Sodium 409 - 811 mmol/L 140  139  141   Potassium 3.5 - 5.1 mmol/L 3.7  3.8  4.0   Chloride 98 - 111 mmol/L   104        Latest Ref Rng & Units 06/06/2023    9:46 AM 06/06/2023    9:37 AM 06/06/2023    8:36 AM  CBC  Hemoglobin 13.0 - 17.0 g/dL 91.4  78.2  95.6   Hematocrit 39.0 - 52.0 % 32.0  33.0  34.0    Lipid Panel     Component Value Date/Time   CHOL 118 02/01/2022 0729   TRIG 217 (H) 02/01/2022 0729   HDL 47 02/01/2022 0729   LDLCALC 37 02/01/2022 0729   HEMOGLOBIN A1C Lab Results  Component Value Date   HGBA1C 7.1 (A) 08/21/2022   MPG 131.24 12/11/2020   Lab Results  Component Value Date   TSH 5.770 (H) 02/01/2022     External labs:   Labs 12/02/2022,  A1c 6.7%.  Urine albumin 42.  TSH normal at 0.09.  Total cholesterol 108, triglycerides 182, HDL 42, LDL 30.  Sodium 138, potassium 3.8, BUN 30, creatinine 1.59, EGFR 44 mL, LFTs normal.   Current Outpatient Medications:    acetaminophen (TYLENOL) 500 MG tablet, Take 1,000 mg by mouth every 8 (eight) hours as needed for moderate pain., Disp: , Rfl:    amLODipine-atorvastatin (CADUET) 10-40 MG tablet, Take 1 tablet by mouth daily., Disp: , Rfl:    aspirin EC 81 MG tablet, Take 81 mg by mouth daily., Disp: , Rfl:    atenolol-chlorthalidone (TENORETIC) 50-25 MG tablet, Take 1 tablet by mouth daily., Disp: , Rfl:    cyclobenzaprine (FLEXERIL) 10 MG tablet, Take 1 tablet (10 mg total) by mouth 3 (three) times daily as needed for muscle spasms., Disp: 30 tablet, Rfl: 1   docusate sodium (COLACE) 50 MG capsule, Take 50 mg by mouth daily., Disp: , Rfl:    ezetimibe (ZETIA) 10 MG tablet, TAKE 1 TABLET DAILY, Disp: 90 tablet, Rfl: 3   JANUMET XR 50-1000 MG TB24, Take 1 tablet by mouth daily., Disp: ,  Rfl:    loratadine (CLARITIN) 10  MG tablet, Take 10 mg by mouth daily as needed for allergies., Disp: , Rfl:    losartan (COZAAR) 100 MG tablet, TAKE 1 TABLET DAILY (Patient taking differently: Take 50 mg by mouth daily.), Disp: 90 tablet, Rfl: 3   meloxicam (MOBIC) 15 MG tablet, Take 15 mg by mouth daily., Disp: , Rfl:    nitroGLYCERIN (NITROSTAT) 0.4 MG SL tablet, Place 1 tablet (0.4 mg total) under the tongue every 5 (five) minutes as needed for chest pain., Disp: 25 tablet, Rfl: 1   omeprazole (PRILOSEC) 20 MG capsule, Take 20 mg by mouth daily., Disp: , Rfl:    traMADol (ULTRAM) 50 MG tablet, Take 50 mg by mouth 2 (two) times daily as needed for pain., Disp: , Rfl:       Cardiac Studies:   Exercise myoview stress 03/03/2019: 1. Lexiscan stress test with low level exercise was performed. Resting blood pressure was 146/70 mmHg and peak effect blood pressure was 172/64 mmHg. The patient developed significant symptoms which included chest and abdominal pain. The chest pain was relieved with rest. The resting and stress electrocardiogram demonstrated normal sinus rhythm, normal resting conduction, occasional PVC's and nonspecific ST-T changes.  Stress EKG is non diagnostic for ischemia as it is a pharmacologic stress.   2. The overall quality of the study is excellent. There is no evidence of abnormal lung activity. Stress and rest SPECT images demonstrate homogeneous tracer distribution throughout the myocardium. Gated SPECT imaging reveals normal myocardial thickening and wall motion. The left ventricular ejection fraction was normal (52%).   3. Low risk study.  Left heart catheterization 09/20/2019: LM: Normal LAD: Mild diffuse disease and late lumen loss within prior Proximal and Mid LAD stent 3x22 and 3x38 Xience Prime DES patent ( (05/27/13). No significant ISR. 100% occluded distal apical LAD. Right to left collaterals from distal RCA. LCx: Minimal late lumen loss within prior prox LCx stent. No significant ISR. RCA: Minimal  late lumen loss within mid RCA stent. No significant ISR of mid RCA 3.0 x 22 mm resolute stent placed on 06/10/2011.  Scoring balloon angioplasty at the PDA stent widely patent. Normal LVEDP.   Continue medical management for CAD.  Overall no significant change from angiography: 05/24/2015. Recommend pulmonary follow up regarding possible COPD.  Carotid artery duplex 02/27/2023: Duplex suggests stenosis in the right internal carotid artery (50-69%). < 50% stenosis in the right external carotid artery. Duplex suggests stenosis in the left internal carotid artery (minimal). < 50% stenosis in the left common carotid artery. Antegrade right vertebral artery flow. Antegrade left vertebral artery flow.  No significant change from 09/10/2022. Follow up in six months is appropriate if clinically indicated.  PCV ECHOCARDIOGRAM COMPLETE 03/13/2023  Narrative Echocardiogram 03/13/2023: Normal LV systolic function with visual EF 60-65%. Left ventricle cavity is normal in size. Normal left ventricular wall thickness. Normal global wall motion. Normal diastolic filling pattern, normal LAP. Trace aortic regurgitation. Aortic valve sclerosis without stenosis. Mild (Grade I) mitral regurgitation. Mild tricuspid regurgitation. No evidence of pulmonary hypertension. No prior study for comparison.  Right & Left Heart Catheterization 06/06/23:     RA 13/11, mean 10 mmHg.  RV 39/7, EDP 14 mmHg.  PA 35/14, mean 24 mmHg.  PA saturation 70%.  PW 17/18, mean 16 mmHg.  QP/QS 1.00.  CO 7.12, CI 3.34.   LV 106/4, EDP 16 mmHg.  Ao 1 and 36/59, mean 80 mmHg.  No pressure gradient across the aortic  valve.   LM: Normal LAD: Mild diffuse disease and late lumen loss within prior Proximal and Mid LAD stent 3x22 and 3x38 Xience DES patent ( (05/27/13). No significant ISR. 100% occluded distal apical LAD. Right to left collaterals from distal RCA. LCx: Minimal late lumen loss within prior prox LCx stent. No significant  ISR. RCA: Minimal late lumen loss within mid RCA stent. No significant ISR of mid RCA 3.0 x 22 mm resolute stent (06/10/2011)) & Scoring balloon angioplasty at the PDA stent widely patent.      Impression: Mild pulm hypertension secondary to mild elevation EDP.  No change in coronary artery since 2020 cardiac catheterization with widely patent stent in the LAD and RCA.    EKG:  EKG 05/22/2023: Normal sinus rhythm at rate of 54 bpm, normal EKG.  Single PAC.   Assessment:     ICD-10-CM   1. Dyspnea on exertion  R06.09     2. Coronary artery disease involving native coronary artery of native heart without angina pectoris  I25.10       No orders of the defined types were placed in this encounter.    There are no discontinued medications.    No orders of the defined types were placed in this encounter.    Recommendations:   Miguel Brock  is a 77 y.o. with history of known coronary artery disease with stenting to mid LAD and circumflex coronary artery on 05/28/2011 and to mid RCA on 06/10/2011 and balloon PTCA to PDA on 05/24/2015, asymptomatic bilateral carotid stenosis, controlled diabetes with stage IIIa chronic kidney disease, mixed hyperlipidemia and hypertension.  due to worsening dyspnea, underwent right and left heart catheterization on 06/06/2023 revealing widely patent stents to the LAD and RCA and no significant change in anatomy since cardiac catheterization in 2020.  He now presents for office visit.    1. Dyspnea on exertion Patient has history of tobacco use disorder and also still smokes cigars, suspect COPD or underlying lung disease to be the etiology for his dyspnea.  Mildly elevated EDP and mild pulm hypertension, WHO group 2 could also be contributing.  No clinical evidence of heart failure.  Referral has been made for pulmonary medicine to see him.  2. Coronary artery disease involving native coronary artery of native heart without angina pectoris Patient's angiography  reviewed with the patient, widely patent stents in the LAD and RCA and no change in anatomy since 2020.  Continue present management.  I will see him back in 6 months or sooner if problems.   Yates Decamp, MD, [facility] 06/19/2023, 2:23 PM Office: [phone] Pager: [phone]

## 2023-07-31 DIAGNOSIS — R200 Anesthesia of skin: Secondary | ICD-10-CM | POA: Insufficient documentation

## 2023-07-31 DIAGNOSIS — M25532 Pain in left wrist: Secondary | ICD-10-CM | POA: Insufficient documentation

## 2023-08-07 ENCOUNTER — Ambulatory Visit (INDEPENDENT_AMBULATORY_CARE_PROVIDER_SITE_OTHER): Payer: Medicare Other | Admitting: Pulmonary Disease

## 2023-08-07 ENCOUNTER — Encounter: Payer: Self-pay | Admitting: Pulmonary Disease

## 2023-08-07 VITALS — BP 126/62 | HR 56 | Ht 71.0 in | Wt 216.0 lb

## 2023-08-07 DIAGNOSIS — R0609 Other forms of dyspnea: Secondary | ICD-10-CM

## 2023-08-07 MED ORDER — STIOLTO RESPIMAT 2.5-2.5 MCG/ACT IN AERS: 2.0000 | INHALATION_SPRAY | Freq: Every day | RESPIRATORY_TRACT | 0 refills | Status: DC

## 2023-08-07 MED ORDER — STIOLTO RESPIMAT 2.5-2.5 MCG/ACT IN AERS: 2.0000 | INHALATION_SPRAY | Freq: Every day | RESPIRATORY_TRACT | 3 refills | Status: DC

## 2023-08-07 NOTE — Progress Notes (Signed)
@Patient  ID: Miguel Brock, male    DOB: 1946/05/16, 77 y.o.   MRN: [id number]  Chief Complaint  Patient presents with   Consult    Referred by Dr. Jacinto Halim for DOE. States this has been going on for the past 8 months. Also has noticed that he has been wheezing more recently, especially with exertion. Productive cough with yellowish-greenish color, denies ever seeing any blood.     Referring provider: Yates Decamp, MD  HPI:   77 y.o. man with history of CAD and pulmonary hypertension related group 2 disease presents for evaluation of dyspnea on exertion.  Multiple cardiology notes reviewed.  Patient notes dyspnea for last several months.  Worse on inclines or stairs.  There is okay on flat surfaces.  No time of day when things are better or worse.  No position to make things better or worse.  No environmental or seasonal factors he can identify to make things better or worse.  No alleviating or exacerbating factors otherwise.  Patient with CAD status post PCI.  Most recent left heart cath 05-2023 with chronically occluded distally with collaterals.  Right heart catheterization revealed mean PA pressure of 24, wedge of 16, PVR approximately 1.  Most recent TTE 2024 with mitral valve regurgitation.  Questionaires / Pulmonary Flowsheets:   ACT:      No data to display          MMRC:     No data to display          Epworth:      No data to display          Tests:   FENO:  No results found for: "NITRICOXIDE"  PFT:    Latest Ref Rng & Units 06/29/2018    2:23 PM 01/19/2018    1:43 PM  PFT Results  FVC-Pre L 4.30  4.14   FVC-Predicted Pre % 94  90   FVC-Post L  4.20   FVC-Predicted Post %  91   Pre FEV1/FVC % % 76  58   Post FEV1/FCV % %  76   FEV1-Pre L 3.28  2.39   FEV1-Predicted Pre % 98  71   FEV1-Post L  3.18   DLCO uncorrected ml/min/mmHg 28.57  27.48   DLCO UNC% % 83  79   DLCO corrected ml/min/mmHg  28.22   DLCO COR %Predicted %  82   DLVA Predicted %  82  89   TLC L  7.28   TLC % Predicted %  99   RV % Predicted %  121   Personally reviewed interpreted as normal spirometry, DLCO within normal limits  WALK:     06/15/2018    2:38 PM  SIX MIN WALK  Medications amlodipine 5mg , aspirin 81mg , hydrochlorothiazide 25mg , losartan 100mg , metformin 500mg , ranitidine 150mg , anoro 62.5-25mcg inhaler all taken at 8:30am  Supplimental Oxygen during Test? (L/min) No  Laps 6  Partial Lap (in Meters) 0 meters  Baseline BP (sitting) 122/68  Baseline Heartrate 49  Baseline Dyspnea (Borg Scale) 0.5  Baseline Fatigue (Borg Scale) 0  Baseline SPO2 97 %  BP (sitting) 130/74  Heartrate 62  Dyspnea (Borg Scale) 0.5  Fatigue (Borg Scale) 0.5  SPO2 96 %  BP (sitting) 120/68  Heartrate 54  SPO2 98 %  Stopped or Paused before Six Minutes No  Interpretation Hip pain   Distance Completed 288 meters  Tech Comments: Pt walked at a normal pace denying any complaints during the walk.  Significant value    Imaging: Personally reviewed and as per EMR and discussion of this note No results found.  Lab Results: Personally reviewed CBC    Component Value Date/Time   WBC 10.4 06/02/2023 1019   WBC 8.8 12/11/2020 1343   RBC 3.89 (L) 06/02/2023 1019   RBC 3.97 (L) 12/11/2020 1343   HGB 10.9 (L) 06/06/2023 0946   HGB 12.2 (L) 06/02/2023 1019   HCT 32.0 (L) 06/06/2023 0946   HCT 36.4 (L) 06/02/2023 1019   PLT 287 06/02/2023 1019   MCV 94 06/02/2023 1019   MCH 31.4 06/02/2023 1019   MCH 33.2 12/11/2020 1343   MCHC 33.5 06/02/2023 1019   MCHC 33.9 12/11/2020 1343   RDW 12.6 06/02/2023 1019   LYMPHSABS 2.0 01/09/2018 1653   MONOABS 1.0 01/09/2018 1653   EOSABS 0.4 01/09/2018 1653   BASOSABS 0.1 01/09/2018 1653    BMET    Component Value Date/Time   NA 140 06/06/2023 0946   NA 140 06/02/2023 1019   K 3.7 06/06/2023 0946   CL 104 06/06/2023 0836   CO2 21 06/02/2023 1019   GLUCOSE 150 (H) 06/06/2023 0836   BUN 25 (H) 06/06/2023 0836    BUN 21 06/02/2023 1019   CREATININE 2.10 (H) 06/06/2023 0836   CALCIUM 10.1 06/02/2023 1019   GFRNONAA 40 (L) 01/08/2021 0926   GFRNONAA 40 (L) 12/11/2020 1343   GFRAA 46 (L) 01/08/2021 0926    BNP No results found for: "BNP"  ProBNP No results found for: "PROBNP"  Specialty Problems       Pulmonary Problems   Dyspnea   Shortness of breath    Allergies  Allergen Reactions   Simvastatin Other (See Comments)    Myalgias   Lisinopril Cough    Other Reaction(s): Cough (ALLERGY/intolerance)    Immunization History  Administered Date(s) Administered   Influenza, High Dose Seasonal PF 08/30/2017, 10/09/2017   Influenza-Unspecified 09/16/2010, 10/30/2010   Moderna Sars-Covid-2 Vaccination 01/06/2020, 02/02/2020   PFIZER(Purple Top)SARS-COV-2 Vaccination 03/14/2020   Pneumococcal Conjugate-13 09/28/2018   Pneumococcal Polysaccharide-23 05/12/2015   Pneumococcal-Unspecified 02/10/2002, 06/07/2013   Tdap 03/21/2010, 04/22/2022    Past Medical History:  Diagnosis Date   Alpha galactosidase deficiency    Arthritis    Bursitis of left hip    Carotid stenosis    Stenosis in the right internal carotid artery (50-69%). Stenosis in the right external carotid artery (<50%).  Stenosis in the left external carotid artery (<50%).  Antegrade right vertebral artery flow. Antegrade left vertebral artery flow.   Chest pain    Chronic post-traumatic stress disorder 08/21/2022   COPD (chronic obstructive pulmonary disease) (HCC)    Coronary artery disease 11,15   stents x7   Diabetes mellitus without complication (HCC)    TYPE 2   Dyspnea    Essential (primary) hypertension 03/30/2019   GERD (gastroesophageal reflux disease)    zantac   H/O hiatal hernia    Hepatitis 70's   food treatment for it in the 80's   History of migraine    Hypertension    Moderate major depression (HCC) 08/21/2022   Trochanteric bursitis of left hip 05/24/2014    Tobacco History: Social History    Tobacco Use  Smoking Status Former   Current packs/day: 0.00   Average packs/day: 1.5 packs/day for 23.0 years (34.5 ttl pk-yrs)   Types: Cigarettes   Start date: 07/05/1958   Quit date: 07/05/1984   Years since quitting: 39.1   Passive exposure: Past  Smokeless Tobacco Never  Tobacco Comments   cuban cigars once/[place]   Counseling given: Not Answered Tobacco comments: cuban cigars once/mont[place]   Continue to not smoke  Outpatient Encounter Medications as of 08/07/2023  Medication Sig   acetaminophen (TYLENOL) 500 MG tablet Take 1,000 mg by mouth every 8 (eight) hours as needed for moderate pain.   amLODipine-atorvastatin (CADUET) 10-40 MG tablet Take 1 tablet by mouth daily.   aspirin EC 81 MG tablet Take 81 mg by mouth daily.   atenolol-chlorthalidone (TENORETIC) 50-25 MG tablet Take 1 tablet by mouth daily.   cyclobenzaprine (FLEXERIL) 10 MG tablet Take 1 tablet (10 mg total) by mouth 3 (three) times daily as needed for muscle spasms.   docusate sodium (COLACE) 50 MG capsule Take 50 mg by mouth daily.   ezetimibe (ZETIA) 10 MG tablet TAKE 1 TABLET DAILY   JANUMET XR 50-1000 MG TB24 Take 1 tablet by mouth daily.   loratadine (CLARITIN) 10 MG tablet Take 10 mg by mouth daily as needed for allergies.   losartan (COZAAR) 100 MG tablet TAKE 1 TABLET DAILY (Patient taking differently: Take 50 mg by mouth daily.)   meloxicam (MOBIC) 15 MG tablet Take 15 mg by mouth daily.   nitroGLYCERIN (NITROSTAT) 0.4 MG SL tablet Place 1 tablet (0.4 mg total) under the tongue every 5 (five) minutes as needed for chest pain.   omeprazole (PRILOSEC) 20 MG capsule Take 20 mg by mouth daily.   Tiotropium Bromide-Olodaterol (STIOLTO RESPIMAT) 2.5-2.5 MCG/ACT AERS Inhale 2 puffs into the lungs daily.   Tiotropium Bromide-Olodaterol (STIOLTO RESPIMAT) 2.5-2.5 MCG/ACT AERS Inhale 2 puffs into the lungs daily.   traMADol (ULTRAM) 50 MG tablet Take 50 mg by mouth 2 (two) times daily as needed for pain.   No  facility-administered encounter medications on file as of 08/07/2023.     Review of Systems  Review of Systems  No chest pain with exertion.  Orthopnea or PND.  Comprehensive review of systems otherwise negative. Physical Exam  BP 126/62   Pulse (!) 56   Ht 5\' 11"  (1.803 m)   Wt 216 lb (98 kg)   SpO2 99% Comment: on RA  BMI 30.13 kg/m   Wt Readings from Last 5 Encounters:  08/07/23 216 lb (98 kg)  06/19/23 219 lb 9.6 oz (99.6 kg)  06/06/23 206 lb (93.4 kg)  05/22/23 215 lb (97.5 kg)  03/06/23 215 lb 3.2 oz (97.6 kg)    BMI Readings from Last 5 Encounters:  08/07/23 30.13 kg/m  06/19/23 30.63 kg/m  06/06/23 28.73 kg/m  05/22/23 30.85 kg/m  03/06/23 30.88 kg/m     Physical Exam General: Sitting in chair, no acute distress Eyes: EOMI, no icterus Neck: Supple, no JVP Pulmonary: Clear, normal work of breathing Cardiovascular: Warm, no edema Abdomen: Nondistended, bowel sounds present MSK: No synovitis, no joint effusion Neuro: Normal gait, no weakness Psych: Normal mood, full affect   Assessment & Plan:   Dyspnea on exertion: Suspect multifactorial.  Significant coronary artery disease in the past.  Likely deconditioning.  Recently documented pulmonary hypertension.  All contributing.  Significant smoking history.  Emphysema on CT scan.  Possible smoking-related lung disease contributing, recent chest x-ray does appear mildly hyperinflated.  PFTs for further evaluation.  Trial Stiolto 2 puffs daily.  Chronic cough: Suspect multifactorial related to possible chronic bronchitis with history of smoking, possible asthma, likely postnasal drip given description of the same.  Assess response to Stiolto.  Consider adding intranasal regimen next given likely  biggest contributor from postnasal drip.   Return in about 3 months (around 11/07/2023) for f/u Dr. Judeth Horn, after PFT.   Karren Burly, MD 08/07/2023   This appointment required 60 minutes of patient care  (this includes precharting, chart review, review of results, face-to-face care, etc.).

## 2023-08-07 NOTE — Patient Instructions (Signed)
Nice to meet you  Try Stiolto 2 puffs once a day  We will get pulmonary function tests for further evaluation of your symptoms  Return to clinic in 3 months after PFT

## 2023-08-22 DIAGNOSIS — G5602 Carpal tunnel syndrome, left upper limb: Secondary | ICD-10-CM | POA: Insufficient documentation

## 2023-08-22 DIAGNOSIS — G5622 Lesion of ulnar nerve, left upper limb: Secondary | ICD-10-CM | POA: Insufficient documentation

## 2023-11-18 ENCOUNTER — Ambulatory Visit: Payer: Medicare Other | Admitting: Pulmonary Disease

## 2023-11-25 ENCOUNTER — Encounter: Payer: Self-pay | Admitting: Cardiology

## 2023-12-05 ENCOUNTER — Telehealth: Payer: Self-pay | Admitting: *Deleted

## 2023-12-05 NOTE — Telephone Encounter (Signed)
Dr. Jacinto Halim We are asked to hold ASA for wrist surgery.  He has very long stents in the LAD and RCA (up to 38 mm). Do you recommend holding ASA?

## 2023-12-05 NOTE — Telephone Encounter (Signed)
   Pre-operative Risk Assessment    Patient Name: Miguel Brock  DOB: [birth year] MRN: [id number]      Request for Surgical Clearance    Procedure:   LEFT CARPAL TUNNEL RELEASE, LEFT ULNAR NERVE  Date of Surgery:  Clearance 01/30/24                                 Surgeon:  Andreas Blower, MD Surgeon's Group or Practice Name:  [facility] Phone number:  [phone] Fax number:  [phone]  ATTN:  [name]   Type of Clearance Requested:   - Medical  - Pharmacy:  Hold Aspirin NOT INDICATED   Type of Anesthesia:  General    Additional requests/questions:    [name]   12/05/2023, 1:21 PM

## 2023-12-06 NOTE — Telephone Encounter (Signed)
He has been stable for a good while. Hand surgery can be quite difficult. Fine with holding ASA, restart next day after surgery

## 2023-12-08 NOTE — Telephone Encounter (Signed)
   Name: Miguel Brock  DOB: 1946/03/05  MRN: [id number]  Primary Cardiologist: Yates Decamp, MD   Preoperative team, please contact this patient and set up a phone call appointment for further preoperative risk assessment. Please obtain consent and complete medication review. Thank you for your help.  I confirm that guidance regarding antiplatelet and oral anticoagulation therapy has been completed and, if necessary, noted below.  Per Dr. Jacinto Halim patient can hold aspirin for procedure, he may hold aspirin for 5-7 days prior to procedure with restarting of aspirin the day after surgery.   I also confirmed the patient [place] in the state [place]. As per [facility] Medical Board telemedicine laws, the patient must reside in the state in which the provider is licensed.   Rip Harbour, NP 12/08/2023, 12:08 PM [facility]

## 2023-12-08 NOTE — Telephone Encounter (Signed)
Pt has appt with Dr. Jacinto Halim 12/09/23. Per pre op APP Miguel Levering, NP ok to defer clearance to MD at appt 12/09/23.   I will update all parties involved.

## 2023-12-09 ENCOUNTER — Encounter: Payer: Self-pay | Admitting: Cardiology

## 2023-12-09 ENCOUNTER — Ambulatory Visit: Payer: Medicare Other | Attending: Cardiology | Admitting: Cardiology

## 2023-12-09 VITALS — BP 137/71 | HR 59 | Resp 16 | Ht 71.0 in | Wt 202.6 lb

## 2023-12-09 DIAGNOSIS — I6523 Occlusion and stenosis of bilateral carotid arteries: Secondary | ICD-10-CM | POA: Insufficient documentation

## 2023-12-09 DIAGNOSIS — I10 Essential (primary) hypertension: Secondary | ICD-10-CM | POA: Insufficient documentation

## 2023-12-09 DIAGNOSIS — Z01810 Encounter for preprocedural cardiovascular examination: Secondary | ICD-10-CM | POA: Diagnosis present

## 2023-12-09 DIAGNOSIS — I2510 Atherosclerotic heart disease of native coronary artery without angina pectoris: Secondary | ICD-10-CM | POA: Diagnosis present

## 2023-12-09 DIAGNOSIS — E782 Mixed hyperlipidemia: Secondary | ICD-10-CM | POA: Insufficient documentation

## 2023-12-09 NOTE — Patient Instructions (Addendum)
Medication Instructions:  Your physician recommends that you continue on your current medications as directed. Please refer to the Current Medication list given to you today.  *If you need a refill on your cardiac medications before your next appointment, please call your pharmacy*   Lab Work: none If you have labs (blood work) drawn today and your tests are completely normal, you will receive your results only by: MyChart Message (if you have MyChart) OR A paper copy in the mail If you have any lab test that is abnormal or we need to change your treatment, we will call you to review the results.   Testing/Procedures: Your physician has requested that you have a carotid duplex. This test is an ultrasound of the carotid arteries in your neck. It looks at blood flow through these arteries that supply the brain with blood. Allow one hour for this exam. There are no restrictions or special instructions. Scheduled for 03/01/24     Follow-Up: At [facility], you and your health needs are our priority.  As part of our continuing mission to provide you with exceptional heart care, we have created designated Provider Care Teams.  These Care Teams include your primary Cardiologist (physician) and Advanced Practice Providers (APPs -  Physician Assistants and Nurse Practitioners) who all work together to provide you with the care you need, when you need it.  We recommend signing up for the patient portal called "MyChart".  Sign up information is provided on this After Visit Summary.  MyChart is used to connect with patients for Virtual Visits (Telemedicine).  Patients are able to view lab/test results, encounter notes, upcoming appointments, etc.  Non-urgent messages can be sent to your provider as well.   To learn more about what you can do with MyChart, go to [email or web address].    Your next appointment:   12 month(s)  Provider:   Yates Decamp, MD     Other Instructions

## 2023-12-09 NOTE — Progress Notes (Signed)
Cardiology Office Note:  .   Date:  12/09/2023  ID:  Miguel Brock, DOB Jun 15, 1946, MRN [id number] PCP: Genevie Ann  [facility] Providers Cardiologist:  Yates Decamp, MD   History of Present Illness: .   Miguel Brock is a 77 y.o. male  with history of known coronary artery disease with stenting to mid LAD and circumflex coronary artery on 05/28/2011 and to mid RCA on 06/10/2011 and balloon PTCA to PDA on 05/24/2015, asymptomatic bilateral carotid stenosis, controlled diabetes with stage IIIb chronic kidney disease, mixed hyperlipidemia and hypertension.  due to worsening dyspnea, underwent right and left heart catheterization on 06/06/2023 revealing widely patent stents to the LAD and RCA and no significant change in anatomy since cardiac catheterization in 2020.   Discussed the use of AI scribe software for clinical note transcription with the patient, who gave verbal consent to proceed.  History of Present Illness   The patient, with a history of coronary artery disease, hypertension, hyperlipidemia, and carotid artery disease, presents for a follow-up visit. He reports intermittent breathing difficulty, which has improved with the use of an inhaler prescribed by a pulmonologist. The inhaler is used as needed, and the patient reports no need for it in the past one and a half to two months. He denies chest pain but does experience occasional shortness of breath, which he manages by resting. He denies smoking and has significantly reduced his alcohol consumption since recovering from COVID-19. The patient is also scheduled for hand surgery due to a previous injury. He expresses concern about stopping aspirin use prior to the surgery due to bleeding risk.      Review of Systems  Cardiovascular:  Positive for dyspnea on exertion. Negative for chest pain and leg swelling.   Labs   External Labs:  Labs 09/17/2023:  A1c 7.3%.  Total cholesterol 125, triglycerides 237, HDL 39,  LDL 56.  Serum glucose 117 mg, BUN 28, creatinine 1.99, EGFR 34 mL, potassium 4.4, LFTs normal.  Physical Exam:   VS:  BP 137/71 (BP Location: Left Arm, Patient Position: Sitting, Cuff Size: Normal)   Pulse (!) 59   Resp 16   Ht 5\' 11"  (1.803 m)   Wt 202 lb 9.6 oz (91.9 kg)   SpO2 98%   BMI 28.26 kg/m    Wt Readings from Last 3 Encounters:  12/09/23 202 lb 9.6 oz (91.9 kg)  08/07/23 216 lb (98 kg)  06/19/23 219 lb 9.6 oz (99.6 kg)    Physical Exam Neck:     Vascular: No JVD.  Cardiovascular:     Rate and Rhythm: Normal rate and regular rhythm.     Pulses: Intact distal pulses.     Heart sounds: S1 normal and S2 normal. Murmur heard.     Early systolic murmur is present with a grade of 2/6 at the upper right sternal border.     No gallop.  Pulmonary:     Effort: Pulmonary effort is normal.     Breath sounds: Normal breath sounds.  Abdominal:     General: Bowel sounds are normal.     Palpations: Abdomen is soft.  Musculoskeletal:     Right lower leg: No edema.     Left lower leg: No edema.    Studies Reviewed: .    Carotid artery duplex 02/27/2023: Duplex suggests stenosis in the right internal carotid artery (50-69%). < 50% stenosis in the right external carotid artery. Duplex suggests stenosis in the left  internal carotid artery (minimal). < 50% stenosis in the left common carotid artery. Antegrade right vertebral artery flow. Antegrade left vertebral artery flow.  No significant change from 09/10/2022. Follow up in six months is appropriate if clinically indicated.  Echocardiogram 03/13/2023: Normal LV systolic function with visual EF 60-65%. Left ventricle cavity is normal in size. Normal left ventricular wall thickness. Normal global wall motion. Normal diastolic filling pattern, normal LAP. Trace aortic regurgitation. Aortic valve sclerosis without stenosis. Mild (Grade I) mitral regurgitation. Mild tricuspid regurgitation. No evidence of pulmonary  hypertension. No prior study for comparison.  Right & Left Heart Catheterization 06/06/23:  RA 13/11, mean 10 mmHg.  RV 39/7, EDP 14 mmHg.  PA 35/14, mean 24 mmHg.  PA saturation 70%.  PW 17/18, mean 16 mmHg.  QP/QS 1.00.  CO 7.12, CI 3.34.   LV 106/4, EDP 16 mmHg.  Ao 1 and 36/59, mean 80 mmHg.  No pressure gradient across the aortic valve.   LM: Normal LAD: Mild diffuse disease and late lumen loss within prior Proximal and Mid LAD stent 3x22 and 3x38 Xience DES patent ( (05/27/13). No significant ISR. 100% occluded distal apical LAD. Right to left collaterals from distal RCA. LCx: Minimal late lumen loss within prior prox LCx stent. No significant ISR. RCA: Minimal late lumen loss within mid RCA stent. No significant ISR of mid RCA 3.0 x 22 mm resolute stent (06/10/2011)) & Scoring balloon angioplasty at the PDA stent widely patent.      Impression: Mild pulm hypertension secondary to mild elevation EDP.  No change in coronary artery since 2020 cardiac catheterization with widely patent stent in the LAD and RCA.  EKG:    EKG 12/09/2023: Normal sinus rhythm at rate of 58 bpm, normal EKG.  No significant change from 05/22/2023.  Medications and allergies    Allergies  Allergen Reactions   Simvastatin Other (See Comments)    Myalgias   Lisinopril Cough    Other Reaction(s): Cough (ALLERGY/intolerance)    Current Outpatient Medications:    acetaminophen (TYLENOL) 500 MG tablet, Take 1,000 mg by mouth every 8 (eight) hours as needed for moderate pain., Disp: , Rfl:    amLODipine-atorvastatin (CADUET) 10-40 MG tablet, Take 1 tablet by mouth daily., Disp: , Rfl:    aspirin EC 81 MG tablet, Take 81 mg by mouth daily., Disp: , Rfl:    atenolol-chlorthalidone (TENORETIC) 50-25 MG tablet, Take 1 tablet by mouth daily., Disp: , Rfl:    cyclobenzaprine (FLEXERIL) 10 MG tablet, Take 1 tablet (10 mg total) by mouth 3 (three) times daily as needed for muscle spasms., Disp: 30 tablet, Rfl: 1    docusate sodium (COLACE) 50 MG capsule, Take 50 mg by mouth daily., Disp: , Rfl:    ezetimibe (ZETIA) 10 MG tablet, TAKE 1 TABLET DAILY, Disp: 90 tablet, Rfl: 3   JANUMET XR 50-1000 MG TB24, Take 1 tablet by mouth daily., Disp: , Rfl:    loratadine (CLARITIN) 10 MG tablet, Take 10 mg by mouth daily as needed for allergies., Disp: , Rfl:    losartan (COZAAR) 100 MG tablet, TAKE 1 TABLET DAILY (Patient taking differently: Take 50 mg by mouth daily.), Disp: 90 tablet, Rfl: 3   meloxicam (MOBIC) 15 MG tablet, Take 15 mg by mouth daily., Disp: , Rfl:    nitroGLYCERIN (NITROSTAT) 0.4 MG SL tablet, Place 1 tablet (0.4 mg total) under the tongue every 5 (five) minutes as needed for chest pain., Disp: 25 tablet, Rfl: 1  omeprazole (PRILOSEC) 20 MG capsule, Take 20 mg by mouth daily., Disp: , Rfl:    Tiotropium Bromide-Olodaterol (STIOLTO RESPIMAT) 2.5-2.5 MCG/ACT AERS, Inhale 2 puffs into the lungs daily., Disp: 1 each, Rfl: 3   TRULICITY 0.75 MG/0.5ML SOAJ, Inject 0.75 mg into the skin once a week., Disp: , Rfl:    ASSESSMENT AND PLAN: .      ICD-10-CM   1. Preoperative cardiovascular examination  Z01.810 EKG 12-Lead    2. Coronary artery disease involving native coronary artery of native heart without angina pectoris  I25.10     3. Primary hypertension  I10     4. Mixed hyperlipidemia  E78.2     5. Asymptomatic bilateral carotid artery stenosis  I65.23      Assessment and Plan    Coronary Artery Disease Stable with no chest pain. Last cardiac catheterization six months ago showed open stents and healthier arteries due to cholesterol and blood pressure medications. -Continue current medications including Amlodipine, Atorvastatin, Atenolol, Chlorthalidone, and Losartan.  Hypertension Well controlled with current medications. -Continue current medications including Amlodipine, Atenolol, Chlorthalidone, and Losartan.  Hyperlipidemia Cholesterol levels are optimal with current  medications. -Continue current medications including Atorvastatin and Ezetimibe.  Carotid Artery Disease Last carotid duplex was in February 2024 revealing moderate right carotid stenosis. -Order carotid duplex to reassess.  Perioperative Anticoagulation Upcoming left hand surgery with Dr. Amanda Pea. Discussed the risk of bleeding with aspirin use. -Stop aspirin one week prior to surgery. -Resume aspirin as soon as possible post-surgery.  Pulmonary Improvement in breathing with use of inhaler as needed. -Continue current inhaler use as needed.  Follow-up in one year or sooner if any issues arise.      Signed,  Yates Decamp, MD, [facility] 12/09/2023, 9:08 PM [facility] [address] [place], [place] [address] Phone: [phone]. Fax:  [phone]

## 2024-02-11 ENCOUNTER — Ambulatory Visit (INDEPENDENT_AMBULATORY_CARE_PROVIDER_SITE_OTHER): Payer: Medicare Other | Admitting: Pulmonary Disease

## 2024-02-11 ENCOUNTER — Encounter: Payer: Self-pay | Admitting: Pulmonary Disease

## 2024-02-11 VITALS — BP 112/62 | HR 53 | Temp 97.8°F | Ht 71.0 in | Wt 203.0 lb

## 2024-02-11 DIAGNOSIS — R052 Subacute cough: Secondary | ICD-10-CM

## 2024-02-11 MED ORDER — AZITHROMYCIN 250 MG PO TABS: ORAL_TABLET | ORAL | 0 refills | Status: AC

## 2024-02-11 MED ORDER — PREDNISONE 20 MG PO TABS: ORAL_TABLET | ORAL | 0 refills | Status: AC

## 2024-02-11 MED ORDER — STIOLTO RESPIMAT 2.5-2.5 MCG/ACT IN AERS: 2.0000 | INHALATION_SPRAY | Freq: Every day | RESPIRATORY_TRACT | 3 refills | Status: AC

## 2024-02-11 NOTE — Patient Instructions (Addendum)
Your lungs sound clear today this is great news  Given how long the cough and congestion has lasted I think is unlikely to be ongoing and section, more likely ongoing inflammation from the illness you had a few weeks ago.  Take prednisone 40 mg for 5 days and 20 mg for 5 days then stop  Take Z-Pak as prescribed  Continue Stiolto, 2 puffs once a day as it seems like this is helping some your shortness of prior to your recent illness  Return to clinic in 3 months or sooner as needed with Dr. Judeth Horn, see about scheduling the pulmonary function test sometime in the next 1 to 2 months after you are feeling better

## 2024-02-11 NOTE — Progress Notes (Signed)
@Patient  ID: Benay Spice, male    DOB: 01/09/1946, 78 y.o.   MRN: [id number]  Chief Complaint  Patient presents with   Follow-up    Cough with yellow mucus phlegm x 3 weeks.  Patient has not had PFT done.  Wanted to be seen for cough     Referring provider: Verlee Rossetti, PA-C  HPI:   78 y.o. man with history of CAD and pulmonary hypertension related group 2 disease presents for evaluation of dyspnea on exertion.  Multiple cardiology notes reviewed. Here for acute visit.  PFT ordered last visit to further evaluate DOE. He did not schedule this. Prescribed Stiolto at last visit.  This has helped his breathing.  This may bit better while using this.    In terms of acute visit, has cough for 3 weeks. Seen in urgent care while out of town. Promethazine and tessalon perles prescribed. Got doxy and steroid course. He says without improvement initially.  Then says he thinks steroids did help decrease the severity.  Still bringing up phlegm.  Lung exam clear.  HPI at initial visit: Patient notes dyspnea for last several months.  Worse on inclines or stairs.  There is okay on flat surfaces.  No time of day when things are better or worse.  No position to make things better or worse.  No environmental or seasonal factors he can identify to make things better or worse.  No alleviating or exacerbating factors otherwise.  Patient with CAD status post PCI.  Most recent left heart cath 05-2023 with chronically occluded distally with collaterals.  Right heart catheterization revealed mean PA pressure of 24, wedge of 16, PVR approximately 1.  Most recent TTE 2024 with mitral valve regurgitation.  Questionaires / Pulmonary Flowsheets:   ACT:      No data to display          MMRC:     No data to display          Epworth:      No data to display          Tests:   FENO:  No results found for: "NITRICOXIDE"  PFT:    Latest Ref Rng & Units 06/29/2018    2:23 PM 01/19/2018     1:43 PM  PFT Results  FVC-Pre L 4.30  4.14   FVC-Predicted Pre % 94  90   FVC-Post L  4.20   FVC-Predicted Post %  91   Pre FEV1/FVC % % 76  58   Post FEV1/FCV % %  76   FEV1-Pre L 3.28  2.39   FEV1-Predicted Pre % 98  71   FEV1-Post L  3.18   DLCO uncorrected ml/min/mmHg 28.57  27.48   DLCO UNC% % 83  79   DLCO corrected ml/min/mmHg  28.22   DLCO COR %Predicted %  82   DLVA Predicted % 82  89   TLC L  7.28   TLC % Predicted %  99   RV % Predicted %  121   Personally reviewed interpreted as normal spirometry, DLCO within normal limits  WALK:     06/15/2018    2:38 PM  SIX MIN WALK  Medications amlodipine 5mg , aspirin 81mg , hydrochlorothiazide 25mg , losartan 100mg , metformin 500mg , ranitidine 150mg , anoro 62.5-25mcg inhaler all taken at 8:30am  Supplimental Oxygen during Test? (L/min) No  Laps 6  Partial Lap (in Meters) 0 meters  Baseline BP (sitting) 122/68  Baseline Heartrate 49  Baseline  Dyspnea (Borg Scale) 0.5  Baseline Fatigue (Borg Scale) 0  Baseline SPO2 97 %  BP (sitting) 130/74  Heartrate 62  Dyspnea (Borg Scale) 0.5  Fatigue (Borg Scale) 0.5  SPO2 96 %  BP (sitting) 120/68  Heartrate 54  SPO2 98 %  Stopped or Paused before Six Minutes No  Interpretation Hip pain   Distance Completed 288 meters  Tech Comments: Pt walked at a normal pace denying any complaints during the walk.     Significant value    Imaging: Personally reviewed and as per EMR and discussion of this note No results found.  Lab Results: Personally reviewed CBC    Component Value Date/Time   WBC 10.4 06/02/2023 1019   WBC 8.8 12/11/2020 1343   RBC 3.89 (L) 06/02/2023 1019   RBC 3.97 (L) 12/11/2020 1343   HGB 10.9 (L) 06/06/2023 0946   HGB 12.2 (L) 06/02/2023 1019   HCT 32.0 (L) 06/06/2023 0946   HCT 36.4 (L) 06/02/2023 1019   PLT 287 06/02/2023 1019   MCV 94 06/02/2023 1019   MCH 31.4 06/02/2023 1019   MCH 33.2 12/11/2020 1343   MCHC 33.5 06/02/2023 1019   MCHC 33.9  12/11/2020 1343   RDW 12.6 06/02/2023 1019   LYMPHSABS 2.0 01/09/2018 1653   MONOABS 1.0 01/09/2018 1653   EOSABS 0.4 01/09/2018 1653   BASOSABS 0.1 01/09/2018 1653    BMET    Component Value Date/Time   NA 140 06/06/2023 0946   NA 140 06/02/2023 1019   K 3.7 06/06/2023 0946   CL 104 06/06/2023 0836   CO2 21 06/02/2023 1019   GLUCOSE 150 (H) 06/06/2023 0836   BUN 25 (H) 06/06/2023 0836   BUN 21 06/02/2023 1019   CREATININE 2.10 (H) 06/06/2023 0836   CALCIUM 10.1 06/02/2023 1019   GFRNONAA 40 (L) 01/08/2021 0926   GFRNONAA 40 (L) 12/11/2020 1343   GFRAA 46 (L) 01/08/2021 0926    BNP No results found for: "BNP"  ProBNP No results found for: "PROBNP"  Specialty Problems       Pulmonary Problems   Dyspnea   Shortness of breath    Allergies  Allergen Reactions   Simvastatin Other (See Comments)    Myalgias   Lisinopril Cough    Other Reaction(s): Cough (ALLERGY/intolerance)    Immunization History  Administered Date(s) Administered   Influenza, High Dose Seasonal PF 08/30/2017, 10/09/2017   Influenza-Unspecified 09/16/2010, 10/30/2010   Moderna Sars-Covid-2 Vaccination 01/06/2020, 02/02/2020   PFIZER(Purple Top)SARS-COV-2 Vaccination 03/14/2020, 04/11/2020   Pneumococcal Conjugate-13 09/28/2018   Pneumococcal Polysaccharide-23 05/12/2015   Pneumococcal-Unspecified 02/10/2002, 06/07/2013   Tdap 03/21/2010, 04/22/2022    Past Medical History:  Diagnosis Date   Alpha galactosidase deficiency    Arthritis    Bursitis of left hip    Carotid stenosis    Stenosis in the right internal carotid artery (50-69%). Stenosis in the right external carotid artery (<50%).  Stenosis in the left external carotid artery (<50%).  Antegrade right vertebral artery flow. Antegrade left vertebral artery flow.   Chest pain    Chronic post-traumatic stress disorder 08/21/2022   COPD (chronic obstructive pulmonary disease) (HCC)    Coronary artery disease 11,15   stents x7    Diabetes mellitus without complication (HCC)    TYPE 2   Dyspnea    Essential (primary) hypertension 03/30/2019   GERD (gastroesophageal reflux disease)    zantac   H/O hiatal hernia    Hepatitis 70's   food treatment for it  in the 80's   History of migraine    Hypertension    Moderate major depression (HCC) 08/21/2022   Trochanteric bursitis of left hip 05/24/2014    Tobacco History: Social History   Tobacco Use  Smoking Status Former   Current packs/day: 0.00   Average packs/day: 1.5 packs/day for 23.0 years (34.5 ttl pk-yrs)   Types: Cigarettes   Start date: 07/05/1958   Quit date: 07/05/1984   Years since quitting: 39.6   Passive exposure: Past  Smokeless Tobacco Never  Tobacco Comments   cuban cigars once/[place]   Counseling given: Not Answered Tobacco comments: cuban cigars once/[place]   Continue to not smoke  Outpatient Encounter Medications as of 02/11/2024  Medication Sig   acetaminophen (TYLENOL) 500 MG tablet Take 1,000 mg by mouth every 8 (eight) hours as needed for moderate pain.   amLODipine-atorvastatin (CADUET) 10-40 MG tablet Take 1 tablet by mouth daily.   aspirin EC 81 MG tablet Take 81 mg by mouth daily.   atenolol-chlorthalidone (TENORETIC) 50-25 MG tablet Take 1 tablet by mouth daily.   azithromycin (ZITHROMAX) 250 MG tablet Take 2 tablets (500 mg total) by mouth daily for 1 day, THEN 1 tablet (250 mg total) daily for 4 days.   benzonatate (TESSALON) 200 MG capsule Take 200 mg by mouth every 8 (eight) hours.   brompheniramine-pseudoephedrine-DM 30-2-10 MG/5ML syrup Take 10 mLs by mouth every 8 (eight) hours as needed.   cyclobenzaprine (FLEXERIL) 10 MG tablet Take 1 tablet (10 mg total) by mouth 3 (three) times daily as needed for muscle spasms.   docusate sodium (COLACE) 50 MG capsule Take 50 mg by mouth daily.   doxycycline (VIBRAMYCIN) 100 MG capsule Take 100 mg by mouth 2 (two) times daily.   ezetimibe (ZETIA) 10 MG tablet TAKE 1 TABLET DAILY    JANUMET XR 50-1000 MG TB24 Take 1 tablet by mouth daily.   loratadine (CLARITIN) 10 MG tablet Take 10 mg by mouth daily as needed for allergies.   losartan (COZAAR) 100 MG tablet TAKE 1 TABLET DAILY (Patient taking differently: Take 50 mg by mouth daily.)   meloxicam (MOBIC) 15 MG tablet Take 15 mg by mouth daily.   metFORMIN (GLUCOPHAGE-XR) 500 MG 24 hr tablet Take 500 mg by mouth 2 (two) times daily.   nitroGLYCERIN (NITROSTAT) 0.4 MG SL tablet Place 1 tablet (0.4 mg total) under the tongue every 5 (five) minutes as needed for chest pain.   omeprazole (PRILOSEC) 20 MG capsule Take 20 mg by mouth daily.   predniSONE (DELTASONE) 20 MG tablet Take 2 tablets (40 mg total) by mouth daily with breakfast for 5 days, THEN 1 tablet (20 mg total) daily with breakfast for 5 days.   TRULICITY 0.75 MG/0.5ML SOAJ Inject 0.75 mg into the skin once a week.   [DISCONTINUED] Tiotropium Bromide-Olodaterol (STIOLTO RESPIMAT) 2.5-2.5 MCG/ACT AERS Inhale 2 puffs into the lungs daily.   Tiotropium Bromide-Olodaterol (STIOLTO RESPIMAT) 2.5-2.5 MCG/ACT AERS Inhale 2 puffs into the lungs daily.   [DISCONTINUED] predniSONE (DELTASONE) 20 MG tablet Take 20 mg by mouth daily. (Patient not taking: Reported on 02/11/2024)   No facility-administered encounter medications on file as of 02/11/2024.     Review of Systems  Review of Systems  N/a Physical Exam  BP 112/62 (BP Location: Left Arm, Patient Position: Sitting, Cuff Size: Normal)   Pulse (!) 53   Temp 97.8 F (36.6 C) (Oral)   Ht 5\' 11"  (1.803 m)   Wt 203 lb (92.1 kg)  SpO2 94%   BMI 28.31 kg/m   Wt Readings from Last 5 Encounters:  02/11/24 203 lb (92.1 kg)  12/09/23 202 lb 9.6 oz (91.9 kg)  08/07/23 216 lb (98 kg)  06/19/23 219 lb 9.6 oz (99.6 kg)  06/06/23 206 lb (93.4 kg)    BMI Readings from Last 5 Encounters:  02/11/24 28.31 kg/m  12/09/23 28.26 kg/m  08/07/23 30.13 kg/m  06/19/23 30.63 kg/m  06/06/23 28.73 kg/m     Physical  Exam General: Sitting in chair, no acute distress Eyes: EOMI, no icterus Neck: Supple, no JVP Pulmonary: Clear, normal work of breathing on room air Cardiovascular: Warm, no edema Abdomen: Nondistended, bowel sounds present MSK: No synovitis, no joint effusion Neuro: Normal gait, no weakness Psych: Normal mood, full affect   Assessment & Plan:   Dyspnea on exertion: Suspect multifactorial.  Significant coronary artery disease in the past.  Likely deconditioning.  Recently documented pulmonary hypertension.  All contributing.  Significant smoking history.  Emphysema on CT scan.  Possible smoking-related lung disease contributing, recent chest x-ray does appear mildly hyperinflated.  PFTs for further evaluation not yet performed, encouraged to reschedule pulmonary function test.  He does report the Stiolto prescribed last visit has helped.  This was refilled today.  Subacute cough: Preceded by what sounds like viral illness.  Mild improvement with prednisone.  But ongoing.  Suspect this will linger for some weeks depending on what virus infection he had.  Lung exam is clear.  Do not think he is actively infected.  Prednisone taper, Z-Pak more for anti-inflammatory properties than anything.  Assess response.   Return in about 3 months (around 05/10/2024) for f/u Dr. Judeth Horn, after PFT.   Karren Burly, MD 02/11/2024

## 2024-02-18 ENCOUNTER — Other Ambulatory Visit: Payer: Self-pay | Admitting: Cardiology

## 2024-02-18 DIAGNOSIS — I10 Essential (primary) hypertension: Secondary | ICD-10-CM

## 2024-03-01 ENCOUNTER — Ambulatory Visit (HOSPITAL_COMMUNITY): Admission: RE | Admit: 2024-03-01 | Payer: Medicare Other | Source: Ambulatory Visit

## 2024-03-01 ENCOUNTER — Other Ambulatory Visit: Payer: Medicare Other

## 2024-03-03 ENCOUNTER — Ambulatory Visit (HOSPITAL_COMMUNITY)
Admission: RE | Admit: 2024-03-03 | Discharge: 2024-03-03 | Disposition: A | Discharge status: Home / Self Care | Source: Ambulatory Visit | Attending: Cardiology | Admitting: Cardiology

## 2024-03-03 DIAGNOSIS — I6523 Occlusion and stenosis of bilateral carotid arteries: Secondary | ICD-10-CM | POA: Diagnosis not present

## 2024-03-03 NOTE — Progress Notes (Signed)
 Stable carotid and will recheck in 1 year

## 2024-03-09 ENCOUNTER — Telehealth: Payer: Self-pay | Admitting: *Deleted

## 2024-03-09 DIAGNOSIS — I6523 Occlusion and stenosis of bilateral carotid arteries: Secondary | ICD-10-CM

## 2024-03-09 NOTE — Telephone Encounter (Signed)
-----   Message from [facility] April G sent at 03/03/2024 11:25 AM EST ----- Regarding: Handicap Placard Pt would like handicap placard filled out and mailed to him.  Thanks, April

## 2024-03-09 NOTE — Telephone Encounter (Signed)
 Patient notified of message regarding handicap placard and carotid doppler results reviewed with patient

## 2024-03-09 NOTE — Telephone Encounter (Signed)
-----   Message from Miguel Brock sent at 03/03/2024  5:00 PM EST ----- Stable carotid and will recheck in 1 year

## 2024-03-09 NOTE — Telephone Encounter (Signed)
 Patient returned RN's call.

## 2024-03-09 NOTE — Telephone Encounter (Signed)
 Reviewed with Dr Jacinto Halim who recommends patient contact PCP regarding handicap placard as there is no cardiac diagnosis that supports need for placard.  I placed call to patient and left message to call office

## 2024-03-11 ENCOUNTER — Ambulatory Visit: Payer: Medicare Other | Admitting: Cardiology

## 2024-03-12 DIAGNOSIS — Z4789 Encounter for other orthopedic aftercare: Secondary | ICD-10-CM | POA: Insufficient documentation

## 2024-05-04 ENCOUNTER — Ambulatory Visit: Payer: Medicare Other | Admitting: Pulmonary Disease

## 2024-05-14 ENCOUNTER — Other Ambulatory Visit: Payer: Self-pay | Admitting: Cardiology

## 2024-05-14 DIAGNOSIS — E782 Mixed hyperlipidemia: Secondary | ICD-10-CM

## 2024-05-21 DIAGNOSIS — K0262 Dental caries on smooth surface penetrating into dentin: Secondary | ICD-10-CM | POA: Insufficient documentation

## 2024-05-21 DIAGNOSIS — K0252 Dental caries on pit and fissure surface penetrating into dentin: Secondary | ICD-10-CM | POA: Insufficient documentation

## 2024-07-21 ENCOUNTER — Telehealth: Payer: Self-pay | Admitting: Pulmonary Disease

## 2024-07-21 ENCOUNTER — Ambulatory Visit: Admitting: Pulmonary Disease

## 2024-07-21 ENCOUNTER — Encounter: Payer: Self-pay | Admitting: Pulmonary Disease

## 2024-07-21 VITALS — BP 112/62 | HR 57 | Temp 98.0°F | Ht 71.0 in | Wt 199.2 lb

## 2024-07-21 DIAGNOSIS — R0609 Other forms of dyspnea: Secondary | ICD-10-CM

## 2024-07-21 DIAGNOSIS — Z87891 Personal history of nicotine dependence: Secondary | ICD-10-CM

## 2024-07-21 NOTE — Progress Notes (Signed)
 @Patient  ID: Miguel Brock, male    DOB: 1946-07-29, 78 y.o.   MRN: [id number]  Chief Complaint  Patient presents with   Medical Management of Chronic Issues    Referring provider: Loris Elsie PARAS, PA-C  HPI:   78 y.o. man with history of CAD and pulmonary hypertension related group 2 disease presents for evaluation of dyspnea on exertion.    Dyspnea about the same.  Stiolto was helping in the past.  Now he cannot really tell he is mostly symptomatic with hills and inclines and he does not think that the Stiolto is helping with that.  PFT is not done yet.  We discussed getting this.  He can contact his cardiologist for review as well given ongoing shortness of breath not responding to inhalers.  HPI at initial visit: Patient notes dyspnea for last several months.  Worse on inclines or stairs.  There is okay on flat surfaces.  No time of day when things are better or worse.  No position to make things better or worse.  No environmental or seasonal factors he can identify to make things better or worse.  No alleviating or exacerbating factors otherwise.  Patient with CAD status post PCI.  Most recent left heart cath 05-2023 with chronically occluded distally with collaterals.  Right heart catheterization revealed mean PA pressure of 24, wedge of 16, PVR approximately 1.  Most recent TTE 2024 with mitral valve regurgitation.  Questionaires / Pulmonary Flowsheets:   ACT:      No data to display          MMRC:     No data to display          Epworth:      No data to display          Tests:   FENO:  No results found for: NITRICOXIDE  PFT:    Latest Ref Rng & Units 06/29/2018    2:23 PM 01/19/2018    1:43 PM  PFT Results  FVC-Pre L 4.30  4.14   FVC-Predicted Pre % 94  90   FVC-Post L  4.20   FVC-Predicted Post %  91   Pre FEV1/FVC % % 76  58   Post FEV1/FCV % %  76   FEV1-Pre L 3.28  2.39   FEV1-Predicted Pre % 98  71   FEV1-Post L  3.18   DLCO  uncorrected ml/min/mmHg 28.57  27.48   DLCO UNC% % 83  79   DLCO corrected ml/min/mmHg  28.22   DLCO COR %Predicted %  82   DLVA Predicted % 82  89   TLC L  7.28   TLC % Predicted %  99   RV % Predicted %  121   Personally reviewed interpreted as normal spirometry, DLCO within normal limits  WALK:     06/15/2018    2:38 PM  SIX MIN WALK  Medications amlodipine  5mg , aspirin  81mg , hydrochlorothiazide  25mg , losartan  100mg , metformin  500mg , ranitidine 150mg , anoro 62.5-25mcg inhaler all taken at 8:30am  Supplimental Oxygen  during Test? (L/min) No  Laps 6   Partial Lap (in Meters) 0 meters  Baseline BP (sitting) 122/68  Baseline Heartrate 49  Baseline Dyspnea (Borg Scale) 0.5  Baseline Fatigue (Borg Scale) 0  Baseline SPO2 97 %  BP (sitting) 130/74  Heartrate 62  Dyspnea (Borg Scale) 0.5  Fatigue (Borg Scale) 0.5  SPO2 96 %  BP (sitting) 120/68  Heartrate 54  SPO2 98 %  Stopped  or Paused before Six Minutes No  Interpretation Hip pain   Distance Completed 288 meters   Tech Comments: Pt walked at a normal pace denying any complaints during the walk.     Significant value   Data saved with a previous flowsheet row definition    Imaging: Personally reviewed and as per EMR and discussion of this note No results found.  Lab Results: Personally reviewed CBC    Component Value Date/Time   WBC 10.4 06/02/2023 1019   WBC 8.8 12/11/2020 1343   RBC 3.89 (L) 06/02/2023 1019   RBC 3.97 (L) 12/11/2020 1343   HGB 10.9 (L) 06/06/2023 0946   HGB 12.2 (L) 06/02/2023 1019   HCT 32.0 (L) 06/06/2023 0946   HCT 36.4 (L) 06/02/2023 1019   PLT 287 06/02/2023 1019   MCV 94 06/02/2023 1019   MCH 31.4 06/02/2023 1019   MCH 33.2 12/11/2020 1343   MCHC 33.5 06/02/2023 1019   MCHC 33.9 12/11/2020 1343   RDW 12.6 06/02/2023 1019   LYMPHSABS 2.0 01/09/2018 1653   MONOABS 1.0 01/09/2018 1653   EOSABS 0.4 01/09/2018 1653   BASOSABS 0.1 01/09/2018 1653    BMET    Component Value  Date/Time   NA 140 06/06/2023 0946   NA 140 06/02/2023 1019   K 3.7 06/06/2023 0946   CL 104 06/06/2023 0836   CO2 21 06/02/2023 1019   GLUCOSE 150 (H) 06/06/2023 0836   BUN 25 (H) 06/06/2023 0836   BUN 21 06/02/2023 1019   CREATININE 2.10 (H) 06/06/2023 0836   CALCIUM  10.1 06/02/2023 1019   GFRNONAA 40 (L) 01/08/2021 0926   GFRNONAA 40 (L) 12/11/2020 1343   GFRAA 46 (L) 01/08/2021 0926    BNP No results found for: BNP  ProBNP No results found for: PROBNP  Specialty Problems       Pulmonary Problems   Dyspnea   Shortness of breath    Allergies  Allergen Reactions   Simvastatin Other (See Comments)    Myalgias   Lisinopril Cough    Other Reaction(s): Cough (ALLERGY /intolerance)    Immunization History  Administered Date(s) Administered   Influenza, High Dose Seasonal PF 08/30/2017, 10/09/2017   Influenza-Unspecified 09/16/2010, 10/30/2010   Moderna Sars-Covid-2 Vaccination 01/06/2020, 02/02/2020   PFIZER(Purple Top)SARS-COV-2 Vaccination 03/14/2020, 04/11/2020   Pneumococcal Conjugate-13 09/28/2018   Pneumococcal Polysaccharide-23 05/12/2015   Pneumococcal-Unspecified 02/10/2002, 06/07/2013   Tdap 03/21/2010, 04/22/2022    Past Medical History:  Diagnosis Date   Alpha galactosidase deficiency    Arthritis    Bursitis of left hip    Carotid stenosis    Stenosis in the right internal carotid artery (50-69%). Stenosis in the right external carotid artery (<50%).  Stenosis in the left external carotid artery (<50%).  Antegrade right vertebral artery flow. Antegrade left vertebral artery flow.   Chest pain    Chronic post-traumatic stress disorder 08/21/2022   COPD (chronic obstructive pulmonary disease) (HCC)    Coronary artery disease 11,15   stents x7   Diabetes mellitus without complication (HCC)    TYPE 2   Dyspnea    Essential (primary) hypertension 03/30/2019   GERD (gastroesophageal reflux disease)    zantac   H/O hiatal hernia    Hepatitis  70's   food treatment for it in the 80's   History of migraine    Hypertension    Moderate major depression (HCC) 08/21/2022   Trochanteric bursitis of left hip 05/24/2014    Tobacco History: Social History   Tobacco  Use  Smoking Status Former   Current packs/day: 0.00   Average packs/day: 1.5 packs/day for 23.0 years (34.5 ttl pk-yrs)   Types: Cigarettes   Start date: 07/05/1958   Quit date: 07/05/1984   Years since quitting: 40.0   Passive exposure: Past  Smokeless Tobacco Never  Tobacco Comments   cuban cigars once/[place]   Counseling given: Not Answered Tobacco comments: cuban cigars once/[place]   Continue to not smoke  Outpatient Encounter Medications as of 07/21/2024  Medication Sig   acetaminophen  (TYLENOL ) 500 MG tablet Take 1,000 mg by mouth every 8 (eight) hours as needed for moderate pain.   amLODipine -atorvastatin  (CADUET) 10-40 MG tablet Take 1 tablet by mouth daily.   aspirin  EC 81 MG tablet Take 81 mg by mouth daily.   atenolol-chlorthalidone  (TENORETIC) 50-25 MG tablet Take 1 tablet by mouth daily.   benzonatate (TESSALON) 200 MG capsule Take 200 mg by mouth every 8 (eight) hours.   brompheniramine-pseudoephedrine-DM 30-2-10 MG/5ML syrup Take 10 mLs by mouth every 8 (eight) hours as needed.   cyclobenzaprine  (FLEXERIL ) 10 MG tablet Take 1 tablet (10 mg total) by mouth 3 (three) times daily as needed for muscle spasms.   docusate sodium  (COLACE) 50 MG capsule Take 50 mg by mouth daily.   doxycycline (VIBRAMYCIN) 100 MG capsule Take 100 mg by mouth 2 (two) times daily.   ezetimibe  (ZETIA ) 10 MG tablet TAKE 1 TABLET DAILY   JANUMET XR 50-1000 MG TB24 Take 1 tablet by mouth daily.   loratadine  (CLARITIN ) 10 MG tablet Take 10 mg by mouth daily as needed for allergies.   losartan  (COZAAR ) 100 MG tablet Take 1 tablet (100 mg total) by mouth daily.   meloxicam (MOBIC) 15 MG tablet Take 15 mg by mouth daily.   metFORMIN  (GLUCOPHAGE -XR) 500 MG 24 hr tablet Take 500 mg by  mouth 2 (two) times daily.   nitroGLYCERIN  (NITROSTAT ) 0.4 MG SL tablet Place 1 tablet (0.4 mg total) under the tongue every 5 (five) minutes as needed for chest pain.   omeprazole (PRILOSEC) 20 MG capsule Take 20 mg by mouth daily.   Tiotropium Bromide-Olodaterol (STIOLTO RESPIMAT ) 2.5-2.5 MCG/ACT AERS Inhale 2 puffs into the lungs daily.   TRULICITY 0.75 MG/0.5ML SOAJ Inject 0.75 mg into the skin once a week.   No facility-administered encounter medications on file as of 07/21/2024.     Review of Systems  Review of Systems  N/a Physical Exam  BP 112/62   Pulse (!) 57   Temp 98 F (36.7 C) (Temporal)   Ht 5' 11 (1.803 m)   Wt 199 lb 3.2 oz (90.4 kg)   SpO2 94%   BMI 27.78 kg/m   Wt Readings from Last 5 Encounters:  07/21/24 199 lb 3.2 oz (90.4 kg)  02/11/24 203 lb (92.1 kg)  12/09/23 202 lb 9.6 oz (91.9 kg)  08/07/23 216 lb (98 kg)  06/19/23 219 lb 9.6 oz (99.6 kg)    BMI Readings from Last 5 Encounters:  07/21/24 27.78 kg/m  02/11/24 28.31 kg/m  12/09/23 28.26 kg/m  08/07/23 30.13 kg/m  06/19/23 30.63 kg/m     Physical Exam General: Sitting in chair, no acute distress Eyes: EOMI, no icterus Neck: Supple, no JVP Pulmonary: Clear, normal work of breathing on room air Cardiovascular: Warm, no edema Abdomen: Nondistended, bowel sounds present MSK: No synovitis, no joint effusion Neuro: Normal gait, no weakness Psych: Normal mood, full affect   Assessment & Plan:   Dyspnea on exertion: Suspect multifactorial.  Significant coronary artery disease in the past.  Likely deconditioning.  Documented ocumented pulmonary hypertension, large contribution from group 2.  All contributing.  Significant smoking history.  Emphysema on CT scan.  Possible smoking-related lung disease contributing, recent chest x-ray does appear mildly hyperinflated.  Prior PFTs reassuring and largely normal.  Stiolto does not seem to be helping much.  Repeat PFTs ordered not yet obtained.   Will try to get this done in the next couple weeks and reevaluate.  Encouraged him to contact his cardiologist for further evaluation as well.  Subacute cough: Improved with treatment for bronchitis at last visit.  Not much of an issue now.   Return in about 2 months (around 09/21/2024) for f/u Dr. Annella, after PFT.   Donnice JONELLE Annella, MD 07/21/2024

## 2024-07-21 NOTE — Telephone Encounter (Signed)
 Dr. VEAR wanted this PT seen in two weeks w/PFT. I made PFT appt mid August but could not sched w/Dr. H until mid Sept. Wanted to make sure this was acceptable. Please ask Dr. And advise PT if we need to resched to see sooner.

## 2024-07-21 NOTE — Patient Instructions (Signed)
 I am sorry the Stiolto did not help  Okay to stop this for now  Lets get pulmonary function test scheduled today, next available  Return to clinic in 2 months or sooner as needed with Dr. Annella after pulmonary function test, I will try to get these information to you before then and if there is anything we need to do or prescribe in the meantime I will do so

## 2024-07-22 NOTE — Telephone Encounter (Signed)
That is fine, thanks 

## 2024-07-27 ENCOUNTER — Ambulatory Visit: Admitting: Cardiology

## 2024-07-27 NOTE — Progress Notes (Deleted)
  Cardiology Office Note:  .   Date:  07/27/2024  ID:  Miguel Brock, DOB [birth year], MRN [id number] PCP: Miguel Elsie [name]  [facility] Providers Cardiologist:  Gordy Bergamo, MD { Click to update primary MD,subspecialty MD or APP then REFRESH:1}  History of Present Illness: .   Miguel Brock is a 78 y.o.   Discussed the use of AI scribe software for clinical note transcription with the patient, who gave verbal consent to proceed.  History of Present Illness    Labs   External Labs:  Care everywhere labs 05/21/2024:  Urinary albumin to creatinine ratio 23.  Sodium 139, potassium 4.6, BUN 34, creatinine 1.91, EGFR 35 mL, LFTs normal.  Total cholesterol 95, triglycerides 154, HDL 38, LDL 33.  A1c 8.3%.  ROS  ***ROS Physical Exam:   VS:  There were no vitals taken for this visit.   Wt Readings from Last 3 Encounters:  07/21/24 199 lb 3.2 oz (90.4 kg)  02/11/24 203 lb (92.1 kg)  12/09/23 202 lb 9.6 oz (91.9 kg)    ***Physical Exam Studies Reviewed: .    Carotid artery duplex 03/03/2024:   Right Carotid: Velocities in the right ICA are consistent with a high end range  40-59% stenosis. The ECA appears >50% stenosed.  Left Carotid: The ECA appears >50% stenosed. The extracranial vessels were near-normal with only minimal wall thickening or plaque.  Vertebrals:  Bilateral vertebral arteries demonstrate antegrade flow.  Subclavians: Normal flow hemodynamics were seen in bilateral subclavian arteries.   EKG:         Medications ordered    No orders of the defined types were placed in this encounter.    ASSESSMENT AND PLAN: .      ICD-10-CM   1. Coronary artery disease involving native coronary artery of native heart without angina pectoris  I25.10     2. Asymptomatic bilateral carotid artery stenosis  I65.23     3. Primary hypertension  I10     4. Mixed hyperlipidemia  E78.2      Assessment & Plan   Signed,  Gordy Bergamo, MD, [facility] 07/27/2024,  1:30 PM [facility] [address] [place], [place] [address] Phone: [phone]. Fax:  [phone]

## 2024-08-09 ENCOUNTER — Encounter: Payer: Self-pay | Admitting: Pulmonary Disease

## 2024-08-09 ENCOUNTER — Encounter

## 2024-08-16 ENCOUNTER — Ambulatory Visit: Admitting: Cardiology

## 2024-09-14 ENCOUNTER — Ambulatory Visit: Admitting: Pulmonary Disease

## 2024-12-30 ENCOUNTER — Ambulatory Visit: Admission: EM | Admit: 2024-12-30 | Discharge: 2024-12-30 | Disposition: A | Discharge status: Home / Self Care

## 2024-12-30 ENCOUNTER — Encounter: Payer: Self-pay | Admitting: Emergency Medicine

## 2024-12-30 DIAGNOSIS — Z7729 Contact with and (suspected ) exposure to other hazardous substances: Secondary | ICD-10-CM | POA: Insufficient documentation

## 2024-12-30 DIAGNOSIS — Z98811 Dental restoration status: Secondary | ICD-10-CM | POA: Insufficient documentation

## 2024-12-30 DIAGNOSIS — J40 Bronchitis, not specified as acute or chronic: Secondary | ICD-10-CM

## 2024-12-30 DIAGNOSIS — K036 Deposits [accretions] on teeth: Secondary | ICD-10-CM | POA: Insufficient documentation

## 2024-12-30 DIAGNOSIS — J329 Chronic sinusitis, unspecified: Secondary | ICD-10-CM | POA: Diagnosis not present

## 2024-12-30 DIAGNOSIS — M779 Enthesopathy, unspecified: Secondary | ICD-10-CM | POA: Insufficient documentation

## 2024-12-30 DIAGNOSIS — K08531 Fractured dental restorative material with loss of material: Secondary | ICD-10-CM | POA: Insufficient documentation

## 2024-12-30 MED ORDER — AMOXICILLIN-POT CLAVULANATE 875-125 MG PO TABS: 1.0000 | ORAL_TABLET | Freq: Two times a day (BID) | ORAL | 0 refills | Status: AC

## 2024-12-30 MED ORDER — BENZONATATE 100 MG PO CAPS: 100.0000 mg | ORAL_CAPSULE | Freq: Three times a day (TID) | ORAL | 0 refills | Status: DC

## 2024-12-30 MED ORDER — GUAIFENESIN ER 600 MG PO TB12: 600.0000 mg | ORAL_TABLET | Freq: Two times a day (BID) | ORAL | 0 refills | Status: AC

## 2024-12-30 MED ORDER — PREDNISONE 50 MG PO TABS: ORAL_TABLET | ORAL | 0 refills | Status: AC

## 2024-12-30 NOTE — ED Triage Notes (Signed)
 Pt presents c/o sinus pressure x 5 days. Pt states,  4 or 5 days ago it started with sinuses on the right side of my face. When I cough it burns a little bit. I can feel some congestion in my chest too.  Pt denies emesis and diarrhea.
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
## 2024-12-30 NOTE — Discharge Instructions (Signed)

## 2025-01-10 ENCOUNTER — Ambulatory Visit

## 2025-01-11 ENCOUNTER — Ambulatory Visit
Admission: RE | Admit: 2025-01-11 | Discharge: 2025-01-11 | Disposition: A | Discharge status: Home / Self Care | Source: Ambulatory Visit | Attending: Internal Medicine | Admitting: Internal Medicine

## 2025-01-11 ENCOUNTER — Other Ambulatory Visit: Payer: Self-pay

## 2025-01-11 VITALS — BP 120/68 | HR 61 | Temp 97.9°F | Resp 18

## 2025-01-11 DIAGNOSIS — B370 Candidal stomatitis: Secondary | ICD-10-CM | POA: Diagnosis not present

## 2025-01-11 MED ORDER — NYSTATIN 100000 UNIT/ML MT SUSP: 500000.0000 [IU] | Freq: Four times a day (QID) | OROMUCOSAL | 0 refills | Status: AC

## 2025-01-11 NOTE — ED Triage Notes (Signed)
 Pt here for increased pain in mouth and tongue since 4 days after starting antibiotics; pt thinks he could have thrush
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
## 2025-01-11 NOTE — Discharge Instructions (Addendum)
 You have thrush.  Swish and swallow of nystatin  mouth wash every 6 hours for the next 7-10 days.  Tylenol  1,000mg  every 6 hours as needed for pain.   If you develop any new or worsening symptoms or if your symptoms do not start to improve, please return here or follow-up with your primary care provider. If your symptoms are severe, please go to the emergency room.

## 2025-01-28 ENCOUNTER — Encounter (HOSPITAL_COMMUNITY): Payer: Self-pay

## 2025-01-31 ENCOUNTER — Ambulatory Visit: Admitting: Cardiology

## 2025-01-31 ENCOUNTER — Encounter: Payer: Self-pay | Admitting: Cardiology

## 2025-01-31 ENCOUNTER — Other Ambulatory Visit (HOSPITAL_COMMUNITY): Payer: Self-pay

## 2025-01-31 VITALS — BP 124/60 | HR 51 | Ht 71.0 in | Wt 210.0 lb

## 2025-01-31 DIAGNOSIS — E7800 Pure hypercholesterolemia, unspecified: Secondary | ICD-10-CM

## 2025-01-31 DIAGNOSIS — N1832 Chronic kidney disease, stage 3b: Secondary | ICD-10-CM

## 2025-01-31 DIAGNOSIS — E1122 Type 2 diabetes mellitus with diabetic chronic kidney disease: Secondary | ICD-10-CM | POA: Diagnosis not present

## 2025-01-31 DIAGNOSIS — I6521 Occlusion and stenosis of right carotid artery: Secondary | ICD-10-CM | POA: Diagnosis not present

## 2025-01-31 DIAGNOSIS — I10 Essential (primary) hypertension: Secondary | ICD-10-CM

## 2025-01-31 DIAGNOSIS — I2510 Atherosclerotic heart disease of native coronary artery without angina pectoris: Secondary | ICD-10-CM | POA: Diagnosis not present

## 2025-01-31 MED ORDER — ATORVASTATIN CALCIUM 40 MG PO TABS: 40.0000 mg | ORAL_TABLET | Freq: Every day | ORAL | 3 refills | Status: AC

## 2025-01-31 MED ORDER — EMPAGLIFLOZIN 25 MG PO TABS
25.0000 mg | ORAL_TABLET | Freq: Every day | ORAL | 2 refills | Status: AC
  Filled 2025-01-31: qty 30, 30d supply, fill #0

## 2025-01-31 NOTE — Progress Notes (Signed)
 " Cardiology Office Note:  .   Date:  01/31/2025  ID:  Miguel, Brock 1946-01-21, MRN [id number] PCP: Loris Elsie [name]  [facility] Providers Cardiologist:  Gordy Bergamo, MD   History of Present Illness: .   Miguel Brock is a 79 y.o. male patient with coronary artery disease with stenting to mid LAD and circumflex coronary artery on 05/28/2011 and to mid RCA on 06/10/2011 and balloon PTCA to PDA on 05/24/2015, asymptomatic bilateral carotid stenosis, controlled diabetes with stage IIIb chronic kidney disease, mixed hyperlipidemia and hypertension.  due to worsening dyspnea, underwent right and left heart catheterization on 06/06/2023 revealing widely patent stents to the LAD and RCA and no significant change in anatomy since cardiac catheterization in 2020.  Except for dizziness that occurs frequently when he stands up, he remains asymptomatic.    Discussed the use of AI scribe software for clinical note transcription with the patient, who gave verbal consent to proceed.  History of Present Illness Miguel Brock is a 79 year old male with diabetes and chronic kidney disease who presents with dizziness and headaches.  He has dizziness when bending over and on standing, with a near-faint sensation that improves if he has something to hold onto. He also has intermittent pounding sensations in his head when sitting, which he describes as a thump, thump, thump. He denies chest pain, shortness of breath, and palpitations and is mainly concerned about the dizziness and head sensation.  His diabetes was last checked on November 01, 2024, with an A1c of 8.1%. He takes metformin  2000 mg daily, 500 mg in the morning and 1500 mg at night.  He has stage 3A chronic kidney disease. He notes occasional leg swelling that resolves on its own and has no leg swelling today.  He takes a combination medication for blood pressure and cholesterol that includes amlodipine  10 mg and atorvastatin   40 mg.  Cardiac Studies relevent.    Carotid artery duplex 03/03/2024: 40-59% stenosis of the right ICA.  Mild disease left ICA.  >50% stenosis left ECA. Antegrade bilateral vertebral artery flow and normal hemodynamics bilateral subclavian arteries.  Echocardiogram 03/13/2023: Normal LV systolic function with visual EF 60-65%. Left ventricle cavity is normal in size. Normal left ventricular wall thickness. Normal global wall motion. Normal diastolic filling pattern, normal LAP.  Right & Left Heart Catheterization 06/06/23  Normal right heart pressure. Patent LAD stent 3x22 and 3x38 Xience DES patent ( (05/27/13).  Patent RCA 3.0 x 22 mm resolute stent (06/10/2011). Scoring balloon angioplasty at the PDA stent widely patent.      EKG:      Labs    Care everywhere/Faxed External Labs:  Labs 11/01/2024:  A1c 8.1%. Albumin to creatinine ratio 90.9.  Total cholesterol 106, triglycerides 143, HDL 40, LDL 47.  BUN 25, potassium 4.1, creatinine 1.68, eGFR 41 mL, LFTs normal.  Hb 12.7/HCT 35.9 platelets 238.  ROS  Review of Systems  Cardiovascular:  Negative for chest pain, dyspnea on exertion and leg swelling.  Neurological:  Positive for dizziness.   Physical Exam:   VS:  BP 124/60 (BP Location: Left Arm, Patient Position: Sitting, Cuff Size: Normal)   Pulse (!) 51   Ht 5' 11 (1.803 m)   Wt 210 lb (95.3 kg)   SpO2 95%   BMI 29.29 kg/m    Wt Readings from Last 3 Encounters:  01/31/25 210 lb (95.3 kg)  12/30/24 199 lb 4.7 oz (90.4 kg)  07/21/24 199 lb 3.2 oz (90.4 kg)    BP Readings from Last 3 Encounters:  01/31/25 124/60  01/11/25 120/68  12/30/24 130/77   Orthostatic VS for the past 24 hrs (Last 3 readings):  BP- Lying Pulse- Lying BP- Sitting Pulse- Sitting BP- Standing at 0 minutes Pulse- Standing at 0 minutes BP- Standing at 3 minutes Pulse- Standing at 3 minutes  01/31/25 1522 125/68 (!) 48 123/75 52 132/74 52 111/64 54   Physical Exam Neck:     Vascular:  No JVD.  Cardiovascular:     Rate and Rhythm: Normal rate and regular rhythm.     Pulses: Intact distal pulses.          Carotid pulses are  on the right side with bruit and  on the left side with bruit.    Heart sounds: S1 normal and S2 normal. Heart sounds are distant. Murmur heard.     Early systolic murmur is present with a grade of 2/6 at the upper right sternal border.     No gallop.  Pulmonary:     Effort: Pulmonary effort is normal.     Breath sounds: Normal breath sounds.  Abdominal:     General: Bowel sounds are normal.     Palpations: Abdomen is soft.  Musculoskeletal:     Right lower leg: No edema.     Left lower leg: No edema.    ASSESSMENT AND PLAN: .      ICD-10-CM   1. Coronary artery disease involving native coronary artery of native heart without angina pectoris  I25.10     2. Primary hypertension  I10     3. Asymptomatic stenosis of right carotid artery  I65.21     4. Hypercholesteremia  E78.00 atorvastatin  (LIPITOR ) 40 MG tablet    5. Type 2 diabetes mellitus with stage 3b chronic kidney disease, without long-term current use of insulin  (HCC)  E11.22 empagliflozin  (JARDIANCE ) 25 MG TABS tablet   N18.32 Basic metabolic panel with GFR     Assessment & Plan Coronary artery disease of the native vessel without angina pectoris -Patient presently asymptomatic without chest pain or dyspnea. - Continue aspirin  81 mg daily.  Type 2 diabetes mellitus with stage 3b chronic kidney disease Diabetes is uncontrolled with an A1c of 8.1%. Stage 3b chronic kidney disease is present. Jardiance  is recommended to improve glycemic control, protect renal function, and potentially aid in weight loss. - Initiated Jardiance  25 mg once daily before breakfast. - Ensure adequate hydration. - Educated on personal hygiene to prevent infections. - Check BMP in 2 to 3 weeks.  Asymptomatic right carotid artery stenosis Asymptomatic right carotid artery stenosis requires  monitoring. - Will schedule carotid artery duplex ultrasound.  Primary hypertension Blood pressure is soft, contributing to dizziness. Amlodipine  is discontinued to manage blood pressure. - Discontinued amlodipine . - Continue atorvastatin  40 mg once daily. - Atenolol chlorthalidone  50/25 mg daily and losartan  100 mg daily  Hypercholesterolemia Managed with atorvastatin . - Continue atorvastatin  40 mg once daily. - Zetia  10 mg daily  Follow up: 1 Year for CAD, Hypertension, Hyperlipidemia.  Time spent in evaluation of care everywhere records, evaluation of his labs, coordination of care was 38 minutes.  Signed,  Gordy Bergamo, MD, [facility] 01/31/2025, 5:18 PM [facility] [address] [place], [place] [address] Phone: [phone]. Fax:  [phone]  "

## 2025-01-31 NOTE — Patient Instructions (Addendum)
 Medication Instructions:  Meds ordered this encounter  Medications   atorvastatin  (LIPITOR ) 40 MG tablet    Sig: Take 1 tablet (40 mg total) by mouth daily.    Dispense:  90 tablet    Refill:  3    Stop Caduet   empagliflozin  (JARDIANCE ) 25 MG TABS tablet    Sig: Take 1 tablet (25 mg total) by mouth daily before breakfast.    Dispense:  30 tablet    Refill:  2   Medications Discontinued During This Encounter  Medication Reason   benzonatate  (TESSALON ) 100 MG capsule Completed Course   brompheniramine-pseudoephedrine-DM 30-2-10 MG/5ML syrup Completed Course   amLODipine -atorvastatin  (CADUET) 10-40 MG tablet Change in therapy    *If you need a refill on your cardiac medications before your next appointment, please call your pharmacy*  Lab Work: 2-3 weeks (2/16 - 2/23) Lab Orders         Basic metabolic panel with GFR     If you have labs (blood work) drawn today and your tests are completely normal, you will receive your results only by: MyChart Message (if you have MyChart) OR A paper copy in the mail If you have any lab test that is abnormal or we need to change your treatment, we will call you to review the results.  Testing/Procedures: It is time to schedule your carotid artery duplex.  Follow-Up: At [facility], you and your health needs are our priority.  As part of our continuing mission to provide you with exceptional heart care, our providers are all part of one team.  This team includes your primary Cardiologist (physician) and Advanced Practice Providers or APPs (Physician Assistants and Nurse Practitioners) who all work together to provide you with the care you need, when you need it.  Your next appointment:   6 month(s)  Provider:   Gordy Bergamo, MD      We recommend signing up for the patient portal called MyChart.  Patients are able to view lab/test results, encounter notes, upcoming appointments, etc.  Non-urgent messages can be sent to your provider  as well, go to [email or web address].

## 2025-02-14 ENCOUNTER — Ambulatory Visit (HOSPITAL_COMMUNITY)
